# Patient Record
Sex: Male | Born: 1975
Health system: Southern US, Community
[De-identification: ages and names within clinical notes are randomized; demographics above are authoritative.]

## PROBLEM LIST (undated history)

## (undated) DIAGNOSIS — I10 Essential (primary) hypertension: Secondary | ICD-10-CM

## (undated) DIAGNOSIS — J690 Pneumonitis due to inhalation of food and vomit: Secondary | ICD-10-CM

## (undated) DIAGNOSIS — K7200 Acute and subacute hepatic failure without coma: Secondary | ICD-10-CM

## (undated) DIAGNOSIS — K759 Inflammatory liver disease, unspecified: Secondary | ICD-10-CM

## (undated) DIAGNOSIS — M6282 Rhabdomyolysis: Secondary | ICD-10-CM

## (undated) DIAGNOSIS — J9600 Acute respiratory failure, unspecified whether with hypoxia or hypercapnia: Secondary | ICD-10-CM

## (undated) DIAGNOSIS — G8929 Other chronic pain: Secondary | ICD-10-CM

## (undated) DIAGNOSIS — M549 Dorsalgia, unspecified: Secondary | ICD-10-CM

## (undated) DIAGNOSIS — E119 Type 2 diabetes mellitus without complications: Secondary | ICD-10-CM

## (undated) DIAGNOSIS — G9340 Encephalopathy, unspecified: Secondary | ICD-10-CM

## (undated) DIAGNOSIS — F1010 Alcohol abuse, uncomplicated: Secondary | ICD-10-CM

## (undated) DIAGNOSIS — R413 Other amnesia: Secondary | ICD-10-CM

## (undated) DIAGNOSIS — M5430 Sciatica, unspecified side: Secondary | ICD-10-CM

## (undated) HISTORY — DX: Acute and subacute hepatic failure without coma: K72.00

## (undated) HISTORY — DX: Encephalopathy, unspecified: G93.40

## (undated) HISTORY — PX: APPENDECTOMY: SHX54

## (undated) HISTORY — DX: Pneumonitis due to inhalation of food and vomit: J69.0

## (undated) HISTORY — DX: Inflammatory liver disease, unspecified: K75.9

## (undated) HISTORY — DX: Acute respiratory failure, unspecified whether with hypoxia or hypercapnia: J96.00

## (undated) HISTORY — DX: Essential (primary) hypertension: I10

## (undated) HISTORY — DX: Rhabdomyolysis: M62.82

## (undated) HISTORY — DX: Type 2 diabetes mellitus without complications: E11.9

---

## 2000-07-03 HISTORY — PX: BACK SURGERY: SHX140

## 2014-09-01 DIAGNOSIS — G9340 Encephalopathy, unspecified: Secondary | ICD-10-CM

## 2014-09-01 DIAGNOSIS — J690 Pneumonitis due to inhalation of food and vomit: Secondary | ICD-10-CM

## 2014-09-01 DIAGNOSIS — J9600 Acute respiratory failure, unspecified whether with hypoxia or hypercapnia: Secondary | ICD-10-CM

## 2014-09-01 HISTORY — DX: Encephalopathy, unspecified: G93.40

## 2014-09-01 HISTORY — DX: Pneumonitis due to inhalation of food and vomit: J69.0

## 2014-09-01 HISTORY — DX: Acute respiratory failure, unspecified whether with hypoxia or hypercapnia: J96.00

## 2014-10-03 ENCOUNTER — Emergency Department (HOSPITAL_COMMUNITY)
Admission: EM | Admit: 2014-10-03 | Discharge: 2014-10-03 | Disposition: A | Discharge status: Home / Self Care | Payer: BLUE CROSS/BLUE SHIELD | Attending: Emergency Medicine | Admitting: Emergency Medicine

## 2014-10-03 ENCOUNTER — Encounter (HOSPITAL_COMMUNITY): Payer: Self-pay | Admitting: Nurse Practitioner

## 2014-10-03 DIAGNOSIS — Z794 Long term (current) use of insulin: Secondary | ICD-10-CM | POA: Insufficient documentation

## 2014-10-03 DIAGNOSIS — Z9889 Other specified postprocedural states: Secondary | ICD-10-CM | POA: Insufficient documentation

## 2014-10-03 DIAGNOSIS — G8929 Other chronic pain: Secondary | ICD-10-CM | POA: Insufficient documentation

## 2014-10-03 DIAGNOSIS — Z79899 Other long term (current) drug therapy: Secondary | ICD-10-CM | POA: Insufficient documentation

## 2014-10-03 DIAGNOSIS — Z720 Tobacco use: Secondary | ICD-10-CM | POA: Diagnosis not present

## 2014-10-03 DIAGNOSIS — M549 Dorsalgia, unspecified: Secondary | ICD-10-CM | POA: Insufficient documentation

## 2014-10-03 DIAGNOSIS — Z760 Encounter for issue of repeat prescription: Secondary | ICD-10-CM | POA: Diagnosis not present

## 2014-10-03 HISTORY — DX: Sciatica, unspecified side: M54.30

## 2014-10-03 MED ORDER — METHOCARBAMOL 500 MG PO TABS: 500.0000 mg | ORAL_TABLET | Freq: Two times a day (BID) | ORAL | Status: DC

## 2014-10-03 MED ORDER — OXYCODONE-ACETAMINOPHEN 5-325 MG PO TABS
2.0000 | ORAL_TABLET | Freq: Once | ORAL | Status: AC
  Administered 2014-10-03: 2 via ORAL
  Filled 2014-10-03: qty 2

## 2014-10-03 MED ORDER — OXYCODONE-ACETAMINOPHEN 5-325 MG PO TABS: 2.0000 | ORAL_TABLET | ORAL | Status: DC | PRN

## 2014-10-03 NOTE — ED Notes (Addendum)
Pt c/o severe lower back pain radiating down R leg x 1 week. He states it is his sciatica pain. He denies any new injuries and reports the pain has been under control recently. He did have back surgery many years ago. He tried tramadol at home which provided minimal pain relief and he ran out of it. He also tried ice packs. His HR is elevated, he feels that it is due to the pain.

## 2014-10-03 NOTE — ED Provider Notes (Signed)
CSN: [id number]     Arrival date & time 10/03/14  1058 History   First MD Initiated Contact with Patient 10/03/14 1145     Chief Complaint  Patient presents with  . Back Pain     (Consider location/radiation/quality/duration/timing/severity/associated sxs/prior Treatment) Patient is a 39 y.o. male presenting with back pain. The history is provided by the patient and a parent. No language interpreter was used.  Back Pain Associated symptoms: no abdominal pain, no numbness and no weakness   Chris Smith is a 39 y.o white male who has a history of back pain and previous back surgery.  He presents today for chronic back pain that he says is no different than his normal back pain.  [place] recently moved here from [place] and is living with his father.  He was followed by pain management until recently.  He was taking oxycodone for pain but has run out of his pain medications. He was given tramadol by Dr. Farris HasKramer, his orthopedist. He has an MRI of his back scheduled for 4pm today. He denies a history of cancer, injury, or IV drug use.  He denies any fever, chills, hematuria, dysuria, urinary frequency, or CVA tenderness.  Past Medical History  Diagnosis Date  . Sciatica    Past Surgical History  Procedure Laterality Date  . Appendectomy    . Back surgery     History reviewed. No pertinent family history. History  Substance Use Topics  . Smoking status: Current Some Day Smoker  . Smokeless tobacco: Not on file  . Alcohol Use: No    Review of Systems  Gastrointestinal: Negative for abdominal pain, blood in stool and abdominal distention.  Musculoskeletal: Positive for back pain.  Neurological: Negative for weakness and numbness.  All other systems reviewed and are negative.     Allergies  Review of patient's allergies indicates no known allergies.  Home Medications   Prior to Admission medications   Medication Sig Start Date End Date Taking? Authorizing Provider  enalapril  (VASOTEC) 20 MG tablet Take 20 mg by mouth 2 (two) times daily.   Yes Historical Provider, MD  insulin aspart (NOVOLOG) 100 UNIT/ML injection Inject 5-20 Units into the skin 3 (three) times daily before meals. Sliding scale   Yes Historical Provider, MD  insulin detemir (LEVEMIR) 100 UNIT/ML injection Inject 60 Units into the skin every morning.   Yes Historical Provider, MD  metoprolol (TOPROL-XL) 200 MG 24 hr tablet Take 200 mg by mouth daily.   Yes Historical Provider, MD  traMADol (ULTRAM) 50 MG tablet Take 50 mg by mouth every 6 (six) hours as needed for moderate pain.   Yes Historical Provider, MD  methocarbamol (ROBAXIN) 500 MG tablet Take 1 tablet (500 mg total) by mouth 2 (two) times daily. 10/03/14   Felix Pratt Patel-Mills, PA-C  oxyCODONE-acetaminophen (PERCOCET/ROXICET) 5-325 MG per tablet Take 2 tablets by mouth every 4 (four) hours as needed for severe pain. 10/03/14   Kendricks Reap Patel-Mills, PA-C   BP 134/72 mmHg  Pulse 98  Temp(Src) 97.8 F (36.6 C) (Oral)  Resp 20  SpO2 98% Physical Exam  Constitutional: He is oriented to person, place, and time. He appears well-developed and well-nourished.  Cardiovascular: Normal rate, regular rhythm and normal heart sounds.   Pulmonary/Chest: Effort normal and breath sounds normal.  Abdominal: Soft. There is no tenderness.  Musculoskeletal: Normal range of motion.  Sitting with legs crossed on exam.  He has a L-spine surgical scar. He has mild reproducible lower back  tenderness to palpation.  No midline tenderness. Negative straight leg raise.  He has good strength and sensation in his lower extremities.   Neurological: He is alert and oriented to person, place, and time.  Skin: Skin is warm and dry.  Nursing note and vitals reviewed.   ED Course  Procedures (including critical care time) Labs Review Labs Reviewed - No data to display  Imaging Review No results found.   EKG Interpretation None      MDM   Final diagnoses:  Back pain,  chronic  Patient presents for chronic back pain that is like his normal back pain.  He was being seen by pain management in [place] but ran out of his oxycodone. He recently moved here and lives with his father. He has an MRI scheduled this afternoon by his orthopedist, Dr. Farris Has.  He was given tramadol 4 days ago but states it is not working. He does not have any saddle anesthesia, no fever, no midline tenderness, no injury, no history of cancer or IV drug use.   I explained that he would need to be prescribed narcotic pain medication by a pain management specialist.  His orthopedist did not feel that a stronger pain medication was necessary at the time of his appointment. He has a f/u appointment on Monday with ortho to discuss MRI. I have given him 8 percocet and muscle relaxers for pain for the next 3 days until his f/u appointment but explained that we do not manage chronic pain in the ED. He is to take ibuprofen also. He agrees with the plan and will f/u.      Catha Gosselin, PA-C 10/03/14 1603  Blake Divine, MD 10/04/14 [phone]

## 2014-10-03 NOTE — Discharge Instructions (Signed)

## 2014-10-13 ENCOUNTER — Ambulatory Visit: Payer: BLUE CROSS/BLUE SHIELD | Attending: Family Medicine | Admitting: Family Medicine

## 2014-10-13 ENCOUNTER — Inpatient Hospital Stay: Payer: BLUE CROSS/BLUE SHIELD | Admitting: Family Medicine

## 2014-10-13 ENCOUNTER — Encounter: Payer: Self-pay | Admitting: Family Medicine

## 2014-10-13 VITALS — BP 137/100 | HR 141 | Temp 97.7°F | Resp 20 | Ht 74.0 in | Wt 267.0 lb

## 2014-10-13 DIAGNOSIS — E118 Type 2 diabetes mellitus with unspecified complications: Secondary | ICD-10-CM

## 2014-10-13 DIAGNOSIS — Z79899 Other long term (current) drug therapy: Secondary | ICD-10-CM | POA: Insufficient documentation

## 2014-10-13 DIAGNOSIS — E1365 Other specified diabetes mellitus with hyperglycemia: Secondary | ICD-10-CM | POA: Insufficient documentation

## 2014-10-13 DIAGNOSIS — M545 Low back pain, unspecified: Secondary | ICD-10-CM | POA: Insufficient documentation

## 2014-10-13 DIAGNOSIS — E669 Obesity, unspecified: Secondary | ICD-10-CM

## 2014-10-13 DIAGNOSIS — E119 Type 2 diabetes mellitus without complications: Secondary | ICD-10-CM | POA: Insufficient documentation

## 2014-10-13 DIAGNOSIS — R413 Other amnesia: Secondary | ICD-10-CM

## 2014-10-13 DIAGNOSIS — Z794 Long term (current) use of insulin: Secondary | ICD-10-CM | POA: Insufficient documentation

## 2014-10-13 DIAGNOSIS — F17200 Nicotine dependence, unspecified, uncomplicated: Secondary | ICD-10-CM | POA: Insufficient documentation

## 2014-10-13 DIAGNOSIS — M5440 Lumbago with sciatica, unspecified side: Secondary | ICD-10-CM

## 2014-10-13 DIAGNOSIS — I10 Essential (primary) hypertension: Secondary | ICD-10-CM | POA: Diagnosis not present

## 2014-10-13 LAB — COMPREHENSIVE METABOLIC PANEL
ALBUMIN: 4.2 g/dL (ref 3.5–5.2)
ALK PHOS: 92 U/L (ref 39–117)
ALT: 19 U/L (ref 0–53)
AST: 16 U/L (ref 0–37)
BUN: 4 mg/dL — AB (ref 6–23)
CHLORIDE: 99 meq/L (ref 96–112)
CO2: 24 meq/L (ref 19–32)
Calcium: 10.4 mg/dL (ref 8.4–10.5)
Creat: 0.67 mg/dL (ref 0.50–1.35)
Glucose, Bld: 216 mg/dL — ABNORMAL HIGH (ref 70–99)
POTASSIUM: 4.6 meq/L (ref 3.5–5.3)
SODIUM: 138 meq/L (ref 135–145)
Total Bilirubin: 0.6 mg/dL (ref 0.2–1.2)
Total Protein: 7.5 g/dL (ref 6.0–8.3)

## 2014-10-13 LAB — POCT GLYCOSYLATED HEMOGLOBIN (HGB A1C): Hemoglobin A1C: 10.1

## 2014-10-13 LAB — GLUCOSE, POCT (MANUAL RESULT ENTRY): POC GLUCOSE: 242 mg/dL — AB (ref 70–99)

## 2014-10-13 MED ORDER — KETOROLAC TROMETHAMINE 60 MG/2ML IM SOLN
60.0000 mg | Freq: Once | INTRAMUSCULAR | Status: AC
  Administered 2014-10-13: 60 mg via INTRAMUSCULAR

## 2014-10-13 MED ORDER — INSULIN DETEMIR 100 UNIT/ML ~~LOC~~ SOLN: 35.0000 [IU] | Freq: Two times a day (BID) | SUBCUTANEOUS | Status: DC

## 2014-10-13 MED ORDER — MELOXICAM 7.5 MG PO TABS: 7.5000 mg | ORAL_TABLET | Freq: Every day | ORAL | Status: DC

## 2014-10-13 NOTE — Patient Instructions (Signed)
Diabetes and Exercise Exercising regularly is important. It is not just about losing weight. It has many health benefits, such as:  Improving your overall fitness, flexibility, and endurance.  Increasing your bone density.  Helping with weight control.  Decreasing your body fat.  Increasing your muscle strength.  Reducing stress and tension.  Improving your overall health. People with diabetes who exercise gain additional benefits because exercise:  Reduces appetite.  Improves the body's use of blood sugar (glucose).  Helps lower or control blood glucose.  Decreases blood pressure.  Helps control blood lipids (such as cholesterol and triglycerides).  Improves the body's use of the hormone insulin by:  Increasing the body's insulin sensitivity.  Reducing the body's insulin needs.  Decreases the risk for heart disease because exercising:  Lowers cholesterol and triglycerides levels.  Increases the levels of good cholesterol (such as high-density lipoproteins [HDL]) in the body.  Lowers blood glucose levels. YOUR ACTIVITY PLAN  Choose an activity that you enjoy and set realistic goals. Your health care provider or diabetes educator can help you make an activity plan that works for you. Exercise regularly as directed by your health care provider. This includes:  Performing resistance training twice a week such as push-ups, sit-ups, lifting weights, or using resistance bands.  Performing 150 minutes of cardio exercises each week such as walking, running, or playing sports.  Staying active and spending no more than 90 minutes at one time being inactive. Even short bursts of exercise are good for you. Three 10-minute sessions spread throughout the day are just as beneficial as a single 30-minute session. Some exercise ideas include:  Taking the dog for a walk.  Taking the stairs instead of the elevator.  Dancing to your favorite song.  Doing an exercise  video.  Doing your favorite exercise with a friend. RECOMMENDATIONS FOR EXERCISING WITH TYPE 1 OR TYPE 2 DIABETES   Check your blood glucose before exercising. If blood glucose levels are greater than 240 mg/dL, check for urine ketones. Do not exercise if ketones are present.  Avoid injecting insulin into areas of the body that are going to be exercised. For example, avoid injecting insulin into:  The arms when playing tennis.  The legs when jogging.  Keep a record of:  Food intake before and after you exercise.  Expected peak times of insulin action.  Blood glucose levels before and after you exercise.  The type and amount of exercise you have done.  Review your records with your health care provider. Your health care provider will help you to develop guidelines for adjusting food intake and insulin amounts before and after exercising.  If you take insulin or oral hypoglycemic agents, watch for signs and symptoms of hypoglycemia. They include:  Dizziness.  Shaking.  Sweating.  Chills.  Confusion.  Drink plenty of water while you exercise to prevent dehydration or heat stroke. Body water is lost during exercise and must be replaced.  Talk to your health care provider before starting an exercise program to make sure it is safe for you. Remember, almost any type of activity is better than none. Document Released: 09/09/2003 Document Revised: 11/03/2013 Document Reviewed: 11/26/2012 Exit[employer] Patient Information 2015 [employer]. This information is not intended to replace advice given to you by your health care provider. Make sure you discuss any questions you have with your health care provider.  

## 2014-10-13 NOTE — Progress Notes (Signed)
Subjective:    Patient ID: Chris Smith, male    DOB: [birth year], 39 y.o.   MRN: [id number]  HPI  Chris Smith is a new patient with a history of diabetes mellitus, hypertension, chronic low back pain with sciatica previously followed by pain management and now under the care of an orthopedic. He recently had an ED visit for exacerbation of his low back pain where he received a Percocet tablets.  Accompanied by his dad who gives most of the history; patient relocated from [place] after he had a recent hospitalization last month for DKA with resulting loss of short-term memory and he has had problems with administering his medications correctly. Of note he received 60 tramadol pills from his orthopedic which he consumed in 4 days and as per the patient the Tylenol wasn't working. He endorses that he sometimes forgets to take his insulin. His dad is requesting he be placed in a nursing home patient is agreeable to this. The patient complains to me that he left his kids packing [place] gets sad over the fact that he can get to see them since girlfriend took them away.  He complains of persisting low back pain which radiates to both lower extremities and he was receiving oxycodone from a pain management clinic while he was packing [place] and now doesn't have anything that. He denies weakness in the legs no loss of sphincteric function. He does have some numbness in the tips affects his toes and his fingers   Past Medical History  Diagnosis Date  . Sciatica   . Diabetes mellitus without complication   . Hypertension     Past Surgical History  Procedure Laterality Date  . Appendectomy    . Back surgery    . Appendectomy  1994    History   Social History  . Marital Status: Single    Spouse Name: N/A  . Number of Children: N/A  . Years of Education: N/A   Occupational History  . Not on file.   Social History Main Topics  . Smoking status: Current Every Day Smoker --  0.50 packs/day for 18 years  . Smokeless tobacco: Not on file  . Alcohol Use: No  . Drug Use: No  . Sexual Activity: Not on file   Other Topics Concern  . Not on file   Social History Narrative    No Known Allergies  Current Outpatient Prescriptions on File Prior to Visit  Medication Sig Dispense Refill  . enalapril (VASOTEC) 20 MG tablet Take 20 mg by mouth 2 (two) times daily.    . insulin aspart (NOVOLOG) 100 UNIT/ML injection Inject 5-20 Units into the skin 3 (three) times daily before meals. Sliding scale    . methocarbamol (ROBAXIN) 500 MG tablet Take 1 tablet (500 mg total) by mouth 2 (two) times daily. 10 tablet 0  . metoprolol (TOPROL-XL) 200 MG 24 hr tablet Take 200 mg by mouth daily.    . traMADol (ULTRAM) 50 MG tablet Take 50 mg by mouth every 6 (six) hours as needed for moderate pain.    Marland Kitchen oxyCODONE-acetaminophen (PERCOCET/ROXICET) 5-325 MG per tablet Take 2 tablets by mouth every 4 (four) hours as needed for severe pain. (Patient not taking: Reported on 10/13/2014) 8 tablet 0   No current facility-administered medications on file prior to visit.     Review of Systems  General: negative for fever, weight loss, appetite change Eyes: no visual symptoms. ENT: no ear symptoms, no sinus  tenderness, no nasal congestion or sore throat. Neck: no pain  Respiratory: no wheezing, shortness of breath, cough Cardiovascular: no chest pain, no dyspnea on exertion, no pedal edema, no orthopnea. Gastrointestinal: no abdominal pain, no diarrhea, no constipation Genito-Urinary: no urinary frequency, no dysuria, no polyuria. Hematologic: no bruising Endocrine: no cold or heat intolerance Neurological: no headaches, no seizures, no tremors, admits to memory loss  Musculoskeletal: low back pain Skin: no pruritus, no rash. Psychological: no depression, no anxiety,        Objective:  Filed Vitals:   10/13/14 1112  BP: 137/100  Pulse: 141  Temp:   Resp:       Physical Exam    Constitutional: normal appearing,  Eyes: PERRLA HENT: Head is atraumatic, normal sinuses, normal oropharynx, normal appearing tonsils and palate Neck: normal range of motion, no thyromegaly, no JVD cardiovascular: tachycardic rate and rhythm, normal heart sounds, no murmurs, rub or gallop, no pedal edema Respiratory: clear to auscultation bilaterally, no wheezes, no rales, no rhonchi Abdomen: soft, not tender to palpation, normal bowel sounds, no enlarged organs Musculoskeletal: healed lumbar midline surgical scar, positive straight leg raise bilaterally Skin: warm and dry, no lesions. Neurological: alert, oriented x3, cranial nerves I-XII grossly intact Psychological: normal mood.  PH Q9 score is 10 and GAD 7 score is 5         Assessment & Plan:  39 year old male with uncontrolled diabetes mellitus currently on insulin and a history of low back pain status post surgery in the past with ongoing exacerbation associated with sciatica. Patient has shown evidence of poor ability to independently manage her medical conditions and administer medication raising concerns for the need for admission to a nursing facility.  Diabetes mellitus; Uncontrolled with an A1c of 10.1 and CBG of 242 Associated neuropathy; patient was previously on Lyrica prior to coming to [place] but given his current mental state I would hold off until his been seen by neurology. I'm increasing the Levemir to 35 units twice a day and his blood sugars will be reassessed at his next office visit. We would need to obtain his old records; up to date on Pneumovax ; referral to ophthalmology at his next visit.  Hypertension: Uncontrolled with a diastolic elevation. I will not make any changes to his antihypertensive regimen given that this is a his initial visit follow reassess at his next office visit for the need for an increase in dosing.  Amnesia: Short term memory loss due to recent history of DKA. We'll  refer to neurology for further evaluation. I have gotten the social worker to see him to discuss available options for nursing placement; he does have some component of anxiety evidenced by an elevated heart rate which he attributes to being nervous about being in the clinic today  Low back pain with sciatica: Previous history of lumbar surgery, previously followed by pain management and at this time is awaiting a pain management appointment. I have discussed with him and he stated that he is high risk patient given his history of recently overdosing on tramadol secondary to inability to administer medications properly I have advised him to get in touch with his orthopedic regarding refill of his tramadol meanwhile i have placed him on Mobic  Obesity: Advised, cutting portions and increasing physical activity.

## 2014-10-13 NOTE — Progress Notes (Signed)
Patient reports chronic lower back pain. Currently having sciatica down right leg, causes leg to cramp. Currently back pain is at level 8, described as aching, "if I move a certain way it takes my breath", "it's just not good". Patient hospitalized 3/13-3/26 for diabetic coma. Patient is trying to get admitted to rehab or mental health center per patient's father at bedside. Back doctor gave patient 60 tramadol, patient took all 60 in 4 days, patient's father states patient can't remember when he's taken medications, patient reports "they just don't work".

## 2014-10-14 LAB — MICROALBUMIN / CREATININE URINE RATIO
Creatinine, Urine: 138.5 mg/dL
MICROALB/CREAT RATIO: 23.8 mg/g (ref 0.0–30.0)
Microalb, Ur: 3.3 mg/dL — ABNORMAL HIGH (ref <2.0)

## 2014-10-15 ENCOUNTER — Telehealth: Payer: Self-pay

## 2014-10-15 NOTE — Telephone Encounter (Signed)
Left message with patients father, patient stated in office during appointment that it was okay to discuss care with his father. Explained to father that kidneys were spilling protein due to diabetes and for patient to keep taking enalapril which will help. Father voices understanding.

## 2014-10-15 NOTE — Telephone Encounter (Signed)
-----   Message from Enobong Amao, MD sent at 10/14/2014 10:34 AM EDT ----- Please inform him that his urine reveals kidneys are spilling proteins and this is due to his diabetes,; remains on enalapril which should help with this. 

## 2014-10-15 NOTE — Telephone Encounter (Signed)
-----   Message from Jaclyn ShaggyEnobong Amao, MD sent at 10/14/2014 10:34 AM EDT ----- Please inform him that his urine reveals kidneys are spilling proteins and this is due to his diabetes,; remains on enalapril which should help with this.

## 2014-10-20 ENCOUNTER — Telehealth: Payer: Self-pay | Admitting: Clinical

## 2014-10-20 ENCOUNTER — Ambulatory Visit (INDEPENDENT_AMBULATORY_CARE_PROVIDER_SITE_OTHER): Payer: BLUE CROSS/BLUE SHIELD | Admitting: Neurology

## 2014-10-20 ENCOUNTER — Encounter: Payer: Self-pay | Admitting: Neurology

## 2014-10-20 VITALS — BP 140/100 | HR 136 | Resp 18 | Ht 74.0 in | Wt 263.0 lb

## 2014-10-20 DIAGNOSIS — F32A Depression, unspecified: Secondary | ICD-10-CM

## 2014-10-20 DIAGNOSIS — R413 Other amnesia: Secondary | ICD-10-CM

## 2014-10-20 DIAGNOSIS — E0841 Diabetes mellitus due to underlying condition with diabetic mononeuropathy: Secondary | ICD-10-CM

## 2014-10-20 DIAGNOSIS — F329 Major depressive disorder, single episode, unspecified: Secondary | ICD-10-CM

## 2014-10-20 LAB — VITAMIN B12: Vitamin B-12: 510 pg/mL (ref 211–911)

## 2014-10-20 LAB — TSH: TSH: 2.469 u[IU]/mL (ref 0.350–4.500)

## 2014-10-20 MED ORDER — PAROXETINE HCL 10 MG PO TABS: 10.0000 mg | ORAL_TABLET | Freq: Every day | ORAL | Status: DC

## 2014-10-20 MED ORDER — VITAMIN B-1 100 MG PO TABS: 100.0000 mg | ORAL_TABLET | Freq: Every day | ORAL | Status: DC

## 2014-10-20 MED ORDER — FOLIC ACID 1 MG PO TABS: 1.0000 mg | ORAL_TABLET | Freq: Every day | ORAL | Status: DC

## 2014-10-20 NOTE — Patient Instructions (Addendum)
1. Schedule MRI brain without contrast 2. Bloodwork for TSH, B12 3. Start daily thiamine 100mg  4. Start daily folic acid 1mg  5. Start Paxil 10mg  daily  6. Follow-up in 3 months, call our office for any changes

## 2014-10-20 NOTE — Telephone Encounter (Signed)
F/U call to relay message from Miquel DunnLatasha Hunter, Interior and spatial designerDirector of Admissions at [facility]: center will accept clients with [employer], [employer] out-of-state. Pt needs to contact [facility] [phone] or visit at [address], [place], [place]. Pt and father state they will go today.

## 2014-10-20 NOTE — Progress Notes (Signed)
NEUROLOGY CONSULTATION NOTE  Chris Smith MRN: [id number] DOB: 27-Oct-1975  Referring provider: Dr. Jaclyn Shaggy Primary care provider: Beverley Fiedler  Reason for consult:  amnesia  Dear Dr Venetia Night:  Thank you for your kind referral of Chris Smith for consultation of the above symptoms. Although Chris history is well known to you, please allow me to reiterate it for the purpose of our medical record. The patient was accompanied to the clinic by Chris Chris Smith who also provides collateral information. Records and images were personally reviewed where available.  HISTORY OF PRESENT ILLNESS: This is a 39 year old right-handed man with a history of hypertension, diabetes, who was living in [place] where he was found unresponsive and was intubated for 4 days, with a diagnosis of diabetic ketoacidosis. He was admitted from 3/13/ to 3/26, then Chris Chris Smith picked him up 2 weeks ago and moved him to [place]. He has been living in a motel since then and has had memory difficulties, particularly with Chris medications. He reports that he has had no short term memory since hospital discharge. Chris long-term memory is pretty good. He asks the same questions repeatedly. He could not find their car in a parking lot. Chris Chris Smith reports he has to be encouraged to take a shower. He denies any word-finding difficulties, no other difficulties with ADLs. He denies memory changes prior to the hospitalization. Chris Chris Smith reports that when he arrived, Chris Smith would not smile and would not respond to anything. Now he has a lot more of Chris feelings back and is more irritable. He has been more aggressive in the past 2 days, and states he is just upset over the whole situation. He misses Chris Smith in [place].   He denies any headaches, dizziness, vision changes. He has had numbness and tingling in Chris fingers and toes since the coma (no prior history of neuropathy). He denies any bowel/bladder changes. He has no  appetite and lost 70 lbs in a month. He usually gest 5 to 5-1/2 hours of sleep and uses a CPAP machine. He has chronic back pain. He has not driven since he left the hospital. Chris Chris Smith reports Chris memory is better off alcohol and glucose control. He endorsed drinking 3-4 vodka drinks daily for many years.   Laboratory data:    Chemistry      Component Value Date/Time   NA 138 10/13/2014 1212   K 4.6 10/13/2014 1212   CL 99 10/13/2014 1212   CO2 24 10/13/2014 1212   BUN 4* 10/13/2014 1212   CREATININE 0.67 10/13/2014 1212      Component Value Date/Time   CALCIUM 10.4 10/13/2014 1212   ALKPHOS 92 10/13/2014 1212   AST 16 10/13/2014 1212   ALT 19 10/13/2014 1212   BILITOT 0.6 10/13/2014 1212     Lab Results  Component Value Date   HGBA1C 10.10 10/13/2014    PAST MEDICAL HISTORY: Past Medical History  Diagnosis Date  . Sciatica   . Diabetes mellitus without complication   . Hypertension   . Acute respiratory failure 09/2014  . Encephalopathy 09/2014  . Aspiration pneumonia 09/2014  . Rhabdomyolysis unknown  . Ischemic hepatitis unknown    PAST SURGICAL HISTORY: Past Surgical History  Procedure Laterality Date  . Appendectomy    . Back surgery  2002    Lumbar    MEDICATIONS: Current Outpatient Prescriptions on File Prior to Visit  Medication Sig Dispense Refill  . enalapril (VASOTEC) 20 MG  tablet Take 20 mg by mouth 2 (two) times daily.    . insulin aspart (NOVOLOG) 100 UNIT/ML injection Inject 5-20 Units into the skin 3 (three) times daily before meals. Sliding scale    . insulin detemir (LEVEMIR) 100 UNIT/ML injection Inject 0.35 mLs (35 Units total) into the skin 2 (two) times daily. 3 vial 1  . meloxicam (MOBIC) 7.5 MG tablet Take 1 tablet (7.5 mg total) by mouth daily. 30 tablet 0  . methocarbamol (ROBAXIN) 500 MG tablet Take 1 tablet (500 mg total) by mouth 2 (two) times daily. 10 tablet 0  . metoprolol (TOPROL-XL) 200 MG 24 hr tablet Take 200 mg by mouth  daily.     No current facility-administered medications on file prior to visit.    ALLERGIES: No Known Allergies  FAMILY HISTORY: Family History  Problem Relation Age of Onset  . Cancer Mother     cause of death, ovarian & colon  . Diabetes Chris Smith   . Hypertension Chris Smith   . Gout Chris Smith   . Hyperlipidemia Chris Smith     SOCIAL HISTORY: History   Social History  . Marital Status: Single    Spouse Name: N/A  . Number of Children: 2  . Years of Education: N/A   Occupational History  . Disability    Social History Main Topics  . Smoking status: Current Every Day Smoker -- 0.50 packs/day for 18 years  . Smokeless tobacco: Not on file  . Alcohol Use: No  . Drug Use: No  . Sexual Activity: Not on file   Other Topics Concern  . Not on file   Social History Narrative    REVIEW OF SYSTEMS: Constitutional: No fevers, chills, or sweats, no generalized fatigue, change in appetite Eyes: No visual changes, double vision, eye pain Ear, nose and throat: No hearing loss, ear pain, nasal congestion, sore throat Cardiovascular: No chest pain, palpitations Respiratory:  No shortness of breath at rest or with exertion, wheezes GastrointestinaI: No nausea, vomiting, diarrhea, abdominal pain, fecal incontinence Genitourinary:  No dysuria, urinary retention or frequency Musculoskeletal:  + neck pain, back pain Integumentary: No rash, pruritus, skin lesions Neurological: as above Psychiatric: No depression, insomnia, anxiety Endocrine: No palpitations, fatigue, diaphoresis, mood swings, change in appetite, change in weight, increased thirst Hematologic/Lymphatic:  No anemia, purpura, petechiae. Allergic/Immunologic: no itchy/runny eyes, nasal congestion, recent allergic reactions, rashes  PHYSICAL EXAM: Filed Vitals:   10/20/14 1320  BP: 140/100  Pulse: 136  Resp: 18   General: No acute distress Head:  Normocephalic/atraumatic Eyes: Fundoscopic exam shows bilateral sharp discs,  no vessel changes, exudates, or hemorrhages Neck: supple, no paraspinal tenderness, full range of motion Back: No paraspinal tenderness Heart: regular rate and rhythm Lungs: Clear to auscultation bilaterally. Vascular: No carotid bruits. Skin/Extremities: No rash, no edema Neurological Exam: Mental status: alert and oriented to person, place, and time, no dysarthria or aphasia, Fund of knowledge is appropriate.  Remote memory intact.  Attention and concentration are normal.    Able to name objects and repeat phrases.  MMSE - Mini Mental State Exam 10/20/2014  Orientation to time 4  Orientation to Place 5  Registration 3  Attention/ Calculation 5  Recall 1  Language- name 2 objects 2  Language- repeat 1  Language- follow 3 step command 3  Language- read & follow direction 1  Write a sentence 1  Copy design 1  Total score 27   Cranial nerves: CN I: not tested CN II: pupils equal, round and  reactive to light, visual fields intact, fundi unremarkable. CN III, IV, VI:  full range of motion, no nystagmus, no ptosis CN V: facial sensation intact CN VII: upper and lower face symmetric CN VIII: hearing intact to finger rub CN IX, X: gag intact, uvula midline CN XI: sternocleidomastoid and trapezius muscles intact CN XII: tongue midline Bulk & Tone: normal, no fasciculations. Motor: 5/5 throughout with no pronator drift. Sensation: decreased cold, pin, and vibration sense up to ankles bilaterally, intact to light touch, cold, on both UE. Decreased pin on right palm. Romberg test negative Deep Tendon Reflexes: +2 on both UE, brisk +3 right patella, +2 left patella, absent ankle jerks bilaterally, no ankle clonus Plantar responses: downgoing bilaterally Cerebellar: no incoordination on finger to nose, heel to shin. No dysdiadochokinesia Gait: narrow-based and steady, able to tandem walk adequately. Tremor: none  IMPRESSION: This is a 39 year old right-handed woman with a history of  hypertension, diabetes, recent hospitalization for diabetic ketoacidosis after he was found unresponsive in [place]. He also has chronic alcohol use. He presents with cognitive changes after coma, considerations include hypoxic encephalopathy, Wernicke encephalopathy. MRI brain without contrast will be ordered to assess for underlying structural abnormality. Check TSH, B12. He will start daily thiamine and folic acid. We also discussed effects of mood on memory, he is noted to be depressed. He may benefit from starting an SSRI, side effects of Paxil were discussed. He will follow-up in 3 months and knows to call our office for any changes.   Thank you for allowing me to participate in the care of this patient. Please do not hesitate to call for any questions or concerns.   Patrcia DollyKaren Jahaziel Francois, M.D.  CC: Dr. Venetia NightAmao

## 2014-10-21 ENCOUNTER — Telehealth: Payer: Self-pay | Admitting: Family Medicine

## 2014-10-21 NOTE — Telephone Encounter (Signed)
Deniece PortelaWayne patient's father returned my call. I notified him of results.

## 2014-10-21 NOTE — Telephone Encounter (Signed)
Lmovm to return my call. 

## 2014-10-21 NOTE — Telephone Encounter (Signed)
-----   Message from Van ClinesKaren M Aquino, MD sent at 10/21/2014  7:59 AM EDT ----- Pls let him know B12 and thyroid levels normal. Thanks

## 2014-10-25 ENCOUNTER — Encounter: Payer: Self-pay | Admitting: Neurology

## 2014-10-25 DIAGNOSIS — E0841 Diabetes mellitus due to underlying condition with diabetic mononeuropathy: Secondary | ICD-10-CM | POA: Insufficient documentation

## 2014-10-25 DIAGNOSIS — F329 Major depressive disorder, single episode, unspecified: Secondary | ICD-10-CM | POA: Insufficient documentation

## 2014-10-25 DIAGNOSIS — R413 Other amnesia: Secondary | ICD-10-CM | POA: Insufficient documentation

## 2014-10-25 DIAGNOSIS — F32A Depression, unspecified: Secondary | ICD-10-CM | POA: Insufficient documentation

## 2014-10-26 ENCOUNTER — Telehealth: Payer: Self-pay

## 2014-10-26 NOTE — Telephone Encounter (Signed)
-----   Message from Jaclyn ShaggyEnobong Amao, MD sent at 10/23/2014  2:06 PM EDT ----- Herbert SetaHeather please forward [facility]. He will need a PPD placement which can be done at his next office visit. Thanks.  ----- Message -----    From: Rae LipsJamie C McMannes, LCSWA    Sent: 10/22/2014   2:35 PM      To: Viviano Simasarmen H Ramirez, Enobong Amao, MD  This was the [facility]. I did not get this voicemail, perhaps it went elsewhere? His medical records need to be sent to [facility], fax is [phone].   "Good morning, I would love to come and walk you through the process for direct admission [facility]. Let me see if my Business office manager is available to assist me in case the team has questions about insurance.  Also, I left you a message yesterday about Mr. Vedia Pereyraerdue. We may be able to assist him. They have come and toured the facility and the father came back yesterday. If you could please send me any information on him so I can get a pasarr, this would be needed for his admission to the facility. Also if the clinic has completed a TB test or chest xray, I will need this information also. Thank you again "

## 2014-10-26 NOTE — Telephone Encounter (Signed)
[facility] packet faxed to Miquel DunnLatasha Hunter, Interior and spatial designerDirector of Admissions, at [facility].

## 2014-10-28 ENCOUNTER — Ambulatory Visit (HOSPITAL_COMMUNITY): Payer: BLUE CROSS/BLUE SHIELD

## 2014-11-02 ENCOUNTER — Telehealth: Payer: Self-pay | Admitting: Clinical

## 2014-11-02 NOTE — Telephone Encounter (Signed)
No message left, attempt to F/U w pt

## 2015-01-19 ENCOUNTER — Ambulatory Visit: Payer: BLUE CROSS/BLUE SHIELD | Admitting: Neurology

## 2016-04-28 ENCOUNTER — Inpatient Hospital Stay (HOSPITAL_COMMUNITY)
Admission: EM | Admit: 2016-04-28 | Discharge: 2016-04-30 | DRG: 894 | Discharge status: Against Medical Advice | Payer: Self-pay | Attending: Internal Medicine | Admitting: Internal Medicine

## 2016-04-28 ENCOUNTER — Observation Stay (HOSPITAL_COMMUNITY): Payer: Self-pay

## 2016-04-28 ENCOUNTER — Emergency Department (HOSPITAL_COMMUNITY): Payer: Self-pay

## 2016-04-28 ENCOUNTER — Encounter (HOSPITAL_COMMUNITY): Payer: Self-pay | Admitting: Emergency Medicine

## 2016-04-28 DIAGNOSIS — M549 Dorsalgia, unspecified: Secondary | ICD-10-CM

## 2016-04-28 DIAGNOSIS — R1012 Left upper quadrant pain: Secondary | ICD-10-CM

## 2016-04-28 DIAGNOSIS — R7401 Elevation of levels of liver transaminase levels: Secondary | ICD-10-CM

## 2016-04-28 DIAGNOSIS — F10239 Alcohol dependence with withdrawal, unspecified: Principal | ICD-10-CM | POA: Diagnosis present

## 2016-04-28 DIAGNOSIS — I10 Essential (primary) hypertension: Secondary | ICD-10-CM | POA: Diagnosis present

## 2016-04-28 DIAGNOSIS — Z91120 Patient's intentional underdosing of medication regimen due to financial hardship: Secondary | ICD-10-CM

## 2016-04-28 DIAGNOSIS — I4581 Long QT syndrome: Secondary | ICD-10-CM | POA: Diagnosis present

## 2016-04-28 DIAGNOSIS — E1165 Type 2 diabetes mellitus with hyperglycemia: Secondary | ICD-10-CM | POA: Diagnosis present

## 2016-04-28 DIAGNOSIS — Z794 Long term (current) use of insulin: Secondary | ICD-10-CM

## 2016-04-28 DIAGNOSIS — E860 Dehydration: Secondary | ICD-10-CM | POA: Diagnosis present

## 2016-04-28 DIAGNOSIS — Z5321 Procedure and treatment not carried out due to patient leaving prior to being seen by health care provider: Secondary | ICD-10-CM | POA: Diagnosis not present

## 2016-04-28 DIAGNOSIS — K700 Alcoholic fatty liver: Secondary | ICD-10-CM | POA: Diagnosis present

## 2016-04-28 DIAGNOSIS — T383X6A Underdosing of insulin and oral hypoglycemic [antidiabetic] drugs, initial encounter: Secondary | ICD-10-CM | POA: Diagnosis present

## 2016-04-28 DIAGNOSIS — R9431 Abnormal electrocardiogram [ECG] [EKG]: Secondary | ICD-10-CM | POA: Diagnosis present

## 2016-04-28 DIAGNOSIS — Z8249 Family history of ischemic heart disease and other diseases of the circulatory system: Secondary | ICD-10-CM

## 2016-04-28 DIAGNOSIS — E669 Obesity, unspecified: Secondary | ICD-10-CM | POA: Diagnosis present

## 2016-04-28 DIAGNOSIS — Z833 Family history of diabetes mellitus: Secondary | ICD-10-CM

## 2016-04-28 DIAGNOSIS — R1011 Right upper quadrant pain: Secondary | ICD-10-CM | POA: Insufficient documentation

## 2016-04-28 DIAGNOSIS — R109 Unspecified abdominal pain: Secondary | ICD-10-CM | POA: Diagnosis present

## 2016-04-28 DIAGNOSIS — Z800 Family history of malignant neoplasm of digestive organs: Secondary | ICD-10-CM

## 2016-04-28 DIAGNOSIS — K760 Fatty (change of) liver, not elsewhere classified: Secondary | ICD-10-CM | POA: Diagnosis present

## 2016-04-28 DIAGNOSIS — E876 Hypokalemia: Secondary | ICD-10-CM | POA: Diagnosis present

## 2016-04-28 DIAGNOSIS — E119 Type 2 diabetes mellitus without complications: Secondary | ICD-10-CM

## 2016-04-28 DIAGNOSIS — Z720 Tobacco use: Secondary | ICD-10-CM | POA: Diagnosis present

## 2016-04-28 DIAGNOSIS — Z8041 Family history of malignant neoplasm of ovary: Secondary | ICD-10-CM

## 2016-04-28 DIAGNOSIS — R740 Nonspecific elevation of levels of transaminase and lactic acid dehydrogenase [LDH]: Secondary | ICD-10-CM

## 2016-04-28 DIAGNOSIS — R000 Tachycardia, unspecified: Secondary | ICD-10-CM | POA: Diagnosis present

## 2016-04-28 DIAGNOSIS — G8929 Other chronic pain: Secondary | ICD-10-CM | POA: Diagnosis present

## 2016-04-28 DIAGNOSIS — E872 Acidosis, unspecified: Secondary | ICD-10-CM | POA: Diagnosis present

## 2016-04-28 DIAGNOSIS — Z87891 Personal history of nicotine dependence: Secondary | ICD-10-CM

## 2016-04-28 DIAGNOSIS — R413 Other amnesia: Secondary | ICD-10-CM | POA: Diagnosis present

## 2016-04-28 DIAGNOSIS — Z9119 Patient's noncompliance with other medical treatment and regimen: Secondary | ICD-10-CM

## 2016-04-28 DIAGNOSIS — F1010 Alcohol abuse, uncomplicated: Secondary | ICD-10-CM | POA: Diagnosis present

## 2016-04-28 DIAGNOSIS — K529 Noninfective gastroenteritis and colitis, unspecified: Secondary | ICD-10-CM | POA: Diagnosis present

## 2016-04-28 HISTORY — DX: Other amnesia: R41.3

## 2016-04-28 HISTORY — DX: Other chronic pain: G89.29

## 2016-04-28 HISTORY — DX: Dorsalgia, unspecified: M54.9

## 2016-04-28 HISTORY — DX: Alcohol abuse, uncomplicated: F10.10

## 2016-04-28 LAB — COMPREHENSIVE METABOLIC PANEL
ALT: 90 U/L — AB (ref 17–63)
AST: 87 U/L — ABNORMAL HIGH (ref 15–41)
Albumin: 4.3 g/dL (ref 3.5–5.0)
Alkaline Phosphatase: 80 U/L (ref 38–126)
Anion gap: 25 — ABNORMAL HIGH (ref 5–15)
BILIRUBIN TOTAL: 1.3 mg/dL — AB (ref 0.3–1.2)
BUN: 6 mg/dL (ref 6–20)
CALCIUM: 9.6 mg/dL (ref 8.9–10.3)
CO2: 19 mmol/L — ABNORMAL LOW (ref 22–32)
CREATININE: 0.79 mg/dL (ref 0.61–1.24)
Chloride: 94 mmol/L — ABNORMAL LOW (ref 101–111)
GFR calc Af Amer: >60 mL/min (ref >60)
Glucose, Bld: 239 mg/dL — ABNORMAL HIGH (ref 65–99)
Potassium: 3.4 mmol/L — ABNORMAL LOW (ref 3.5–5.1)
Sodium: 138 mmol/L (ref 135–145)
TOTAL PROTEIN: 7.7 g/dL (ref 6.5–8.1)

## 2016-04-28 LAB — BASIC METABOLIC PANEL
Anion gap: 11 (ref 5–15)
BUN: 6 mg/dL (ref 6–20)
CHLORIDE: 99 mmol/L — AB (ref 101–111)
CO2: 25 mmol/L (ref 22–32)
Calcium: 8.5 mg/dL — ABNORMAL LOW (ref 8.9–10.3)
Creatinine, Ser: 0.83 mg/dL (ref 0.61–1.24)
GFR calc Af Amer: >60 mL/min (ref >60)
GLUCOSE: 210 mg/dL — AB (ref 65–99)
POTASSIUM: 3.2 mmol/L — AB (ref 3.5–5.1)
Sodium: 135 mmol/L (ref 135–145)

## 2016-04-28 LAB — CBC WITH DIFFERENTIAL/PLATELET
BASOS PCT: 0 %
Basophils Absolute: 0 10*3/uL (ref 0.0–0.1)
EOS PCT: 0 %
Eosinophils Absolute: 0 10*3/uL (ref 0.0–0.7)
HCT: 50.4 % (ref 39.0–52.0)
Hemoglobin: 17.3 g/dL — ABNORMAL HIGH (ref 13.0–17.0)
LYMPHS PCT: 11 %
Lymphs Abs: 1.2 10*3/uL (ref 0.7–4.0)
MCH: 30.1 pg (ref 26.0–34.0)
MCHC: 34.3 g/dL (ref 30.0–36.0)
MCV: 87.8 fL (ref 78.0–100.0)
MONO ABS: 0.8 10*3/uL (ref 0.1–1.0)
Monocytes Relative: 8 %
Neutro Abs: 8.8 10*3/uL — ABNORMAL HIGH (ref 1.7–7.7)
Neutrophils Relative %: 81 %
PLATELETS: 201 10*3/uL (ref 150–400)
RBC: 5.74 MIL/uL (ref 4.22–5.81)
RDW: 13.9 % (ref 11.5–15.5)
WBC: 10.8 10*3/uL — ABNORMAL HIGH (ref 4.0–10.5)

## 2016-04-28 LAB — GLUCOSE, CAPILLARY
GLUCOSE-CAPILLARY: 287 mg/dL — AB (ref 65–99)
Glucose-Capillary: 194 mg/dL — ABNORMAL HIGH (ref 65–99)
Glucose-Capillary: 189 mg/dL — ABNORMAL HIGH (ref 65–99)
Glucose-Capillary: 255 mg/dL — ABNORMAL HIGH (ref 65–99)

## 2016-04-28 LAB — MAGNESIUM: Magnesium: 1.5 mg/dL — ABNORMAL LOW (ref 1.7–2.4)

## 2016-04-28 LAB — LIPASE, BLOOD: Lipase: 27 U/L (ref 11–51)

## 2016-04-28 LAB — LACTIC ACID, PLASMA
LACTIC ACID, VENOUS: 1.9 mmol/L (ref 0.5–1.9)
Lactic Acid, Venous: 1.8 mmol/L (ref 0.5–1.9)

## 2016-04-28 LAB — PHOSPHORUS: PHOSPHORUS: 2.5 mg/dL (ref 2.5–4.6)

## 2016-04-28 LAB — TSH: TSH: 3.667 u[IU]/mL (ref 0.350–4.500)

## 2016-04-28 LAB — VITAMIN B12: Vitamin B-12: 638 pg/mL (ref 180–914)

## 2016-04-28 LAB — TROPONIN I: Troponin I: <0.03 ng/mL (ref <0.03)

## 2016-04-28 MED ORDER — DIPHENHYDRAMINE HCL 50 MG/ML IJ SOLN
25.0000 mg | Freq: Once | INTRAMUSCULAR | Status: AC
  Administered 2016-04-28: 25 mg via INTRAVENOUS
  Filled 2016-04-28: qty 1

## 2016-04-28 MED ORDER — IOPAMIDOL (ISOVUE-300) INJECTION 61%
INTRAVENOUS | Status: AC
  Filled 2016-04-28: qty 100

## 2016-04-28 MED ORDER — MORPHINE SULFATE (PF) 4 MG/ML IV SOLN
2.0000 mg | INTRAVENOUS | Status: DC | PRN
  Administered 2016-04-28 – 2016-04-30 (×10): 2 mg via INTRAVENOUS
  Filled 2016-04-28 (×11): qty 1

## 2016-04-28 MED ORDER — ACETAMINOPHEN 650 MG RE SUPP: 650.0000 mg | Freq: Four times a day (QID) | RECTAL | Status: DC | PRN

## 2016-04-28 MED ORDER — ONDANSETRON HCL 4 MG/2ML IJ SOLN
4.0000 mg | Freq: Four times a day (QID) | INTRAMUSCULAR | Status: DC | PRN
  Administered 2016-04-30: 4 mg via INTRAVENOUS
  Filled 2016-04-28: qty 2

## 2016-04-28 MED ORDER — VITAMIN B-1 100 MG PO TABS
100.0000 mg | ORAL_TABLET | Freq: Every day | ORAL | Status: DC
Start: 2016-04-28 — End: 2016-04-30
  Administered 2016-04-28 – 2016-04-29 (×2): 100 mg via ORAL
  Filled 2016-04-28 (×2): qty 1

## 2016-04-28 MED ORDER — LORAZEPAM 2 MG/ML IJ SOLN
1.0000 mg | Freq: Four times a day (QID) | INTRAMUSCULAR | Status: DC | PRN
  Administered 2016-04-28 – 2016-04-29 (×4): 1 mg via INTRAVENOUS
  Filled 2016-04-28 (×4): qty 1

## 2016-04-28 MED ORDER — CLONIDINE HCL 0.1 MG/24HR TD PTWK
0.1000 mg | MEDICATED_PATCH | TRANSDERMAL | Status: DC
  Administered 2016-04-28: 0.1 mg via TRANSDERMAL
  Filled 2016-04-28: qty 1

## 2016-04-28 MED ORDER — THIAMINE HCL 100 MG/ML IJ SOLN: 100.0000 mg | Freq: Every day | INTRAMUSCULAR | Status: DC

## 2016-04-28 MED ORDER — MORPHINE SULFATE (PF) 4 MG/ML IV SOLN
4.0000 mg | Freq: Once | INTRAVENOUS | Status: AC
  Administered 2016-04-28: 4 mg via INTRAVENOUS
  Filled 2016-04-28: qty 1

## 2016-04-28 MED ORDER — PROCHLORPERAZINE EDISYLATE 5 MG/ML IJ SOLN: 10.0000 mg | Freq: Four times a day (QID) | INTRAMUSCULAR | Status: DC | PRN

## 2016-04-28 MED ORDER — ACETAMINOPHEN 325 MG PO TABS: 650.0000 mg | ORAL_TABLET | Freq: Four times a day (QID) | ORAL | Status: DC | PRN

## 2016-04-28 MED ORDER — MAGNESIUM SULFATE 2 GM/50ML IV SOLN
2.0000 g | Freq: Once | INTRAVENOUS | Status: AC
  Administered 2016-04-28: 2 g via INTRAVENOUS
  Filled 2016-04-28: qty 50

## 2016-04-28 MED ORDER — SODIUM CHLORIDE 0.9 % IV BOLUS (SEPSIS)
1000.0000 mL | Freq: Once | INTRAVENOUS | Status: AC
  Administered 2016-04-28: 1000 mL via INTRAVENOUS

## 2016-04-28 MED ORDER — LORAZEPAM 1 MG PO TABS: 1.0000 mg | ORAL_TABLET | Freq: Four times a day (QID) | ORAL | Status: DC | PRN

## 2016-04-28 MED ORDER — ENOXAPARIN SODIUM 40 MG/0.4ML ~~LOC~~ SOLN
40.0000 mg | Freq: Every day | SUBCUTANEOUS | Status: DC
  Administered 2016-04-28 – 2016-04-29 (×2): 40 mg via SUBCUTANEOUS
  Filled 2016-04-28 (×2): qty 0.4

## 2016-04-28 MED ORDER — INSULIN ASPART 100 UNIT/ML ~~LOC~~ SOLN
0.0000 [IU] | Freq: Three times a day (TID) | SUBCUTANEOUS | Status: DC
  Administered 2016-04-28: 8 [IU] via SUBCUTANEOUS
  Administered 2016-04-28 – 2016-04-29 (×2): 3 [IU] via SUBCUTANEOUS
  Administered 2016-04-29: 5 [IU] via SUBCUTANEOUS

## 2016-04-28 MED ORDER — SODIUM CHLORIDE 0.9 % IV SOLN
INTRAVENOUS | Status: AC
  Administered 2016-04-28: 09:00:00 via INTRAVENOUS

## 2016-04-28 MED ORDER — METRONIDAZOLE IN NACL 5-0.79 MG/ML-% IV SOLN
500.0000 mg | Freq: Three times a day (TID) | INTRAVENOUS | Status: DC
  Administered 2016-04-28 – 2016-04-29 (×4): 500 mg via INTRAVENOUS
  Filled 2016-04-28 (×5): qty 100

## 2016-04-28 MED ORDER — HYDROMORPHONE HCL 2 MG/ML IJ SOLN
1.0000 mg | Freq: Once | INTRAMUSCULAR | Status: AC
  Administered 2016-04-28: 1 mg via INTRAVENOUS
  Filled 2016-04-28: qty 1

## 2016-04-28 MED ORDER — CIPROFLOXACIN IN D5W 400 MG/200ML IV SOLN
400.0000 mg | Freq: Two times a day (BID) | INTRAVENOUS | Status: DC
  Administered 2016-04-28 – 2016-04-29 (×2): 400 mg via INTRAVENOUS
  Filled 2016-04-28 (×2): qty 200

## 2016-04-28 MED ORDER — METOCLOPRAMIDE HCL 5 MG/ML IJ SOLN
10.0000 mg | Freq: Once | INTRAMUSCULAR | Status: AC
  Administered 2016-04-28: 10 mg via INTRAVENOUS
  Filled 2016-04-28: qty 2

## 2016-04-28 MED ORDER — INSULIN DETEMIR 100 UNIT/ML ~~LOC~~ SOLN
30.0000 [IU] | Freq: Every day | SUBCUTANEOUS | Status: DC
  Administered 2016-04-28 – 2016-04-29 (×2): 30 [IU] via SUBCUTANEOUS
  Filled 2016-04-28 (×3): qty 0.3

## 2016-04-28 MED ORDER — CIPROFLOXACIN IN D5W 400 MG/200ML IV SOLN: 400.0000 mg | INTRAVENOUS | Status: DC

## 2016-04-28 MED ORDER — HYDRALAZINE HCL 20 MG/ML IJ SOLN
5.0000 mg | INTRAMUSCULAR | Status: DC | PRN
  Administered 2016-04-29 – 2016-04-30 (×3): 5 mg via INTRAVENOUS
  Filled 2016-04-28 (×2): qty 1

## 2016-04-28 MED ORDER — METRONIDAZOLE IN NACL 5-0.79 MG/ML-% IV SOLN
500.0000 mg | Freq: Once | INTRAVENOUS | Status: AC
  Administered 2016-04-28: 500 mg via INTRAVENOUS
  Filled 2016-04-28: qty 100

## 2016-04-28 MED ORDER — LORAZEPAM 2 MG/ML IJ SOLN
1.0000 mg | Freq: Three times a day (TID) | INTRAMUSCULAR | Status: DC
  Administered 2016-04-28 – 2016-04-29 (×4): 1 mg via INTRAVENOUS
  Filled 2016-04-28 (×4): qty 1

## 2016-04-28 MED ORDER — HYDROMORPHONE HCL 2 MG/ML IJ SOLN
1.0000 mg | INTRAMUSCULAR | Status: DC | PRN
Start: 2016-04-28 — End: 2016-04-28

## 2016-04-28 MED ORDER — CIPROFLOXACIN IN D5W 400 MG/200ML IV SOLN
400.0000 mg | Freq: Once | INTRAVENOUS | Status: AC
  Administered 2016-04-28: 400 mg via INTRAVENOUS
  Filled 2016-04-28: qty 200

## 2016-04-28 MED ORDER — POTASSIUM CHLORIDE 10 MEQ/100ML IV SOLN
10.0000 meq | Freq: Once | INTRAVENOUS | Status: AC
  Administered 2016-04-28: 10 meq via INTRAVENOUS
  Filled 2016-04-28: qty 100

## 2016-04-28 MED ORDER — ONDANSETRON HCL 4 MG PO TABS: 4.0000 mg | ORAL_TABLET | Freq: Four times a day (QID) | ORAL | Status: DC | PRN

## 2016-04-28 MED ORDER — INSULIN ASPART 100 UNIT/ML ~~LOC~~ SOLN
0.0000 [IU] | Freq: Every day | SUBCUTANEOUS | Status: DC
  Administered 2016-04-28: 3 [IU] via SUBCUTANEOUS
  Administered 2016-04-29: 2 [IU] via SUBCUTANEOUS

## 2016-04-28 MED ORDER — FOLIC ACID 1 MG PO TABS
1.0000 mg | ORAL_TABLET | Freq: Every day | ORAL | Status: DC
  Administered 2016-04-28 – 2016-04-29 (×2): 1 mg via ORAL
  Filled 2016-04-28 (×2): qty 1

## 2016-04-28 MED ORDER — ADULT MULTIVITAMIN W/MINERALS CH
1.0000 | ORAL_TABLET | Freq: Every day | ORAL | Status: DC
  Administered 2016-04-28 – 2016-04-29 (×2): 1 via ORAL
  Filled 2016-04-28 (×2): qty 1

## 2016-04-28 NOTE — H&P (Signed)
History and Physical    Chris Smith ZOX:[id number] DOB: 05-25-1976 DOA: 04/28/2016  PCP: No PCP (went to [facility] urgent care just prior to presentation) Patient coming from: home  Chief Complaint: Persistent abdominal pain/nausea/vomiting  HPI: Chris Smith is a 40 y.o. male with medical history significant for diabetes, EtOH abuse, tobacco abuse hypertension presents to the emergency department with chief complaint persistent abdominal pain/nausea/vomiting. Initial evaluation includes CT of the abdomen and pelvis revealing fatty liver, possible enteritis and gallbladder with sludge without obstruction.  Information is obtained from the patient. He states he was in his usual state of health until yesterday when he developed gradual worsening persistent abdominal pain. He describes the pain as constant sharp and located in epigastric area radiating to the left upper quadrant. Associated symptoms include nausea with multiple episodes of nonbloody emesis. He describes the emesis as "bile-like". He states he took some Zofran with little relief. He reports sitting up straight next the pain worse He also complains of a "sore throat" he attributes to all the vomiting and dry heaving. He also complains of some cold chills after vomiting. He denies headache dizziness syncope or near-syncope. He denies chest pain palpitations shortness of breath lower extremity edema. He denies diarrhea constipation melena bright red blood per rectum denies dysuria hematuria frequency or urgency. He denies lower extremity edema or orthopnea.   ED Course: In the emergency department max temp 99.0, he's hypertensive tachycardic but not hypoxic. He is provided with a milligrams of morphine 1 mg of Dilaudid Cipro Flagyl 2 L of saline and magnesium and potassium intravenously  Review of Systems: As per HPI otherwise 10 point review of systems negative.   Ambulatory Status: He ambulates independently with a steady gait denies any  recent falls  Past Medical History:  Diagnosis Date  . Acute respiratory failure (HCC) 09/2014  . Aspiration pneumonia (HCC) 09/2014  . Diabetes mellitus without complication (HCC)   . Encephalopathy 09/2014  . Hypertension   . Ischemic hepatitis unknown  . Rhabdomyolysis unknown  . Sciatica     Past Surgical History:  Procedure Laterality Date  . APPENDECTOMY    . BACK SURGERY  2002   Lumbar    Social History   Social History  . Marital status: Single    Spouse name: N/A  . Number of children: 2  . Years of education: N/A   Occupational History  . 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 Smoking status: Current Every Day Smoker    Packs/day: 0.50    Years: 18.00  . Smokeless tobacco: Not on file  . Alcohol use No  . Drug use: No  . Sexual activity: Not on file   Other Topics Concern  . Not on file   Social History Narrative  . No narrative on file  Lives in "extended stay" hotel. Lives with girlfriend. [place]  No Known Allergies  Family History  Problem Relation Age of Onset  . Cancer Mother     cause of death, ovarian & colon  . Diabetes Father   . Hypertension Father   . Gout Father   . Hyperlipidemia Father     Prior to Admission medications   Medication Sig Start Date End Date Taking? Authorizing Provider  insulin aspart (NOVOLOG) 100 UNIT/ML injection Inject 5-20 Units into the skin 3 (three) times daily before meals. Sliding scale  Yes Historical Provider, MD  insulin detemir (LEVEMIR) 100 UNIT/ML injection Inject 0.35 mLs (35 Units total) into the skin 2 (two) times daily. Patient taking differently: Inject 5-15 Units into the skin 2 (two) times daily as needed (when not using novolog).  10/13/14  Yes Jaclyn Shaggy, MD    Physical Exam: Vitals:   04/28/16 0600 04/28/16 0717 04/28/16 0817 04/28/16 0818  BP: 145/96 (!) 143/106  (!)  160/111  Pulse: 110 120  (!) 124  Resp: 20 15  20   Temp:    98.6 F (37 C)  TempSrc:    Oral  SpO2: 95% 97%  96%  Weight:   117.9 kg (260 lb)   Height:   6\' 2"  (1.88 m)      General:  Appears Moderately anxious well-nourished appears slightly uncomfortable Eyes:  PERRL, EOMI, normal lids, iris ENT:  grossly normal hearing, lips & tongue, his membranes of his mouth are pink but dry Neck:  no LAD, masses or thyromegaly Cardiovascular:  Tachycardic but regular, no m/r/g. No LE edema.  Respiratory:  CTA bilaterally but slightly diminished, no w/r/r. Normal respiratory effort. Abdomen:  Soft nondistended very sluggish bowel sounds tenderness in epigastric left upper quadrant guarding or rebounding Skin:  no rash or induration seen on limited exam Musculoskeletal:  grossly normal tone BUE/BLE, good ROM, no bony abnormality Psychiatric:  Anxious, speech fluent and appropriate, AOx3 Neurologic:  CN 2-12 grossly intact, moves all extremities in coordinated fashion, sensation intact slightly tremulous  Labs on Admission: I have personally reviewed following labs and imaging studies  CBC:  Recent Labs Lab 04/28/16 0404  WBC 10.8*  NEUTROABS 8.8*  HGB 17.3*  HCT 50.4  MCV 87.8  PLT 201   Basic Metabolic Panel:  Recent Labs Lab 04/28/16 0404  NA 138  K 3.4*  CL 94*  CO2 19*  GLUCOSE 239*  BUN 6  CREATININE 0.79  CALCIUM 9.6  MG 1.5*   GFR: Estimated Creatinine Clearance: 169.2 mL/min (by C-G formula based on SCr of 0.79 mg/dL). Liver Function Tests:  Recent Labs Lab 04/28/16 0404  AST 87*  ALT 90*  ALKPHOS 80  BILITOT 1.3*  PROT 7.7  ALBUMIN 4.3    Recent Labs Lab 04/28/16 0404  LIPASE 27   No results for input(s): AMMONIA in the last 168 hours. Coagulation Profile: No results for input(s): INR, PROTIME in the last 168 hours. Cardiac Enzymes:  Recent Labs Lab 04/28/16 0404  TROPONINI <0.03   BNP (last 3 results) No results for input(s): PROBNP in  the last 8760 hours. HbA1C: No results for input(s): HGBA1C in the last 72 hours. CBG: No results for input(s): GLUCAP in the last 168 hours. Lipid Profile: No results for input(s): CHOL, HDL, LDLCALC, TRIG, CHOLHDL, LDLDIRECT in the last 72 hours. Thyroid Function Tests: No results for input(s): TSH, T4TOTAL, FREET4, T3FREE, THYROIDAB in the last 72 hours. Anemia Panel: No results for input(s): VITAMINB12, FOLATE, FERRITIN, TIBC, IRON, RETICCTPCT in the last 72 hours. Urine analysis: No results found for: COLORURINE, APPEARANCEUR, LABSPEC, PHURINE, GLUCOSEU, HGBUR, BILIRUBINUR, KETONESUR, PROTEINUR, UROBILINOGEN, NITRITE, LEUKOCYTESUR  Creatinine Clearance: Estimated Creatinine Clearance: 169.2 mL/min (by C-G formula based on SCr of 0.79 mg/dL).  Sepsis Labs: @LABRCNTIP (procalcitonin:4,lacticidven:4) )No results found for this or any previous visit (from the past 240 hour(s)).   Radiological Exams on Admission: Ct Abdomen Pelvis W Contrast  Result Date: 04/28/2016 CLINICAL DATA:  40 year old male with epigastric pain, nausea vomiting. EXAM: CT ABDOMEN AND PELVIS WITH CONTRAST TECHNIQUE:  Multidetector CT imaging of the abdomen and pelvis was performed using the standard protocol following bolus administration of intravenous contrast. CONTRAST:  100 cc Isovue-300 COMPARISON:  None. FINDINGS: Lower chest: The visualized lung bases are clear. No intra-abdominal free air or free fluid. Hepatobiliary: Diffuse fatty infiltration of the liver. No intrahepatic biliary ductal dilatation. There is sludge within the gallbladder. No pericholecystic fluid. Pancreas: Unremarkable. No pancreatic ductal dilatation or surrounding inflammatory changes. Spleen: There is indented and scalloped appearance of the lateral spleen with coarse calcification of the splenic capsule, likely sequela of prior infection or hemorrhage. No acute splenic pathology. Adrenals/Urinary Tract: The adrenal glands appear  unremarkable. There is a 4 mm nonobstructing left renal interpolar stone and a 3 mm nonobstructing stone in the inferior pole of the right kidney. There is no hydronephrosis on either side. The visualized ureters and urinary bladder appear unremarkable. Stomach/Bowel: Evaluation of the bowel is limited in the absence of oral contrast. There may be mild thickening of loops of jejunum in the left upper abdomen with mild associated inflammation. Clinical correlation is recommended to evaluate for enteritis. There is no evidence of bowel obstruction. Appendectomy. Vascular/Lymphatic: No significant vascular findings are present. No enlarged abdominal or pelvic lymph nodes. Reproductive: The prostate and seminal vesicles are grossly unremarkable. Other: Small fat containing umbilical hernia. Musculoskeletal: Mild degenerative changes of the spine. No acute fracture. IMPRESSION: Mild thickened appearance of the proximal jejunal loops concerning for enteritis. Clinical correlation is recommended. Fatty liver. Gallbladder sludge without CT evidence of active inflammation. Ultrasound may provide better evaluation if clinically indicated. Small nonobstructing bilateral renal calculi.  No hydronephrosis. Electronically Signed   By: Elgie Collard M.D.   On: 04/28/2016 06:34    EKG: Independently reviewed. Sinus or ectopic atrial tachycardia Probable left atrial enlargement Prolonged QT interval No old tracing to compare  Assessment/Plan Principal Problem:   Abdominal pain Active Problems:   Amnesia   Hypertension   Diabetes mellitus without complication (HCC)   Enteritis   Acidosis   Prolonged Q-T interval on ECG   Hypomagnesemia   ETOH abuse   Tachycardia   Tobacco abuse   Obesity   Fatty liver   1. Abdominal pain/nausea/vomiting. Etiology unclear. Suspect multifactorial. CT concerning for enteritis and reveals gallbladder sludge. Also concern for pancreatitis as patient is EtOH abuse. Lipase within  the limits of normal. Appears dehydrated with metabolic acidosis in the ED. Afebrile, mild leukocytosis non-toxic appearing.  He received morphine and Dilaudid in the emergency department. At the time of my interview he states the pain starting to "ease up". -Admit for observation to telemetry -Vigorous IV fluids -Nothing by mouth -Continue Cipro and Flagyl -Follow abdominal ultrasound -consider GI consult once acute illness resolved -Obtain lactic acid -Check urine drug screen -CIWA protocol -analgesia and anti-emetic -bmet at 4pm  #2. Hypertension. Uncontrolled in the emergency department. Patient states he's had high blood pressure in the past but cannot afford medications. Suspect non-compliance in the setting of EtOH withdrawal/possible pancreatitis/dehydration. Chart review indicates in past home meds included enalapril 20mg  bid. -Vigorous IV fluids as noted above -Clonidine patch -When necessary hydralazine -Monitor closely add additional agents as indicated -will need close OP follow up once acute illness over and baseline determined  #3. Sinus tachycardia/prolonged QT. No home meds noted. Likely related to above, denies chest pain. Initial troponin negative. EKG as noted above. -monitor on tele -iv fluids and pain/nausea control -Ciwa protocol -Repeat EKG in am -consider BB once UDS results back  #  4. Acidosis. Metabolic from dehydration persistent nausea and vomiting. Anion gap 25. Serum glucose 238. CO2 19 -vigorous IV fluids -recheck bmet this afternoon  #5.hyponmagnesemia/hypokalemia. Likely related to dehydration secondary to persistent nausea vomiting -Repleted in the ED -Recheck bmet this afternoon -monitor closely  #6. Diabetes. Uncontrolled. Noncompliant due to finances. Home medications include Levemir and sliding scale. Patient states he does not check his blood sugar frequently and that he "saves" insulin as his finances are limited. Of note in march 2016  [place] was found unresponsive and was intubated for 4 days, with a diagnosis of diabetic ketoacidosis. Then his [place] picked him up and [place].  Hx of memory difficulties since then particularly with his medications. -Nothing by mouth due to #1 and #2 -Vigorous IV fluids -Continue home Levemir at a slightly lower dose -Sliding scale -Obtain a hemoglobin A1c -Diabetes coordinator for education -Case manager medication assistance/PCP  #7. EtOH abuse. Patient reports drinking 3 or 4 times a week 4 to 5 shots each time. Reports withdrawals in the past. Denies seizure activity. Appears quite anxious/fidgety very slightly  tremulous -CIWA protocol -social work consult -monitor closely  #8. Tobacco abuse -Cessation counseling offered  #9. Fatty liver. Per CT. Likely related to ETOH. AST/ALT and total bili slightly elevated. Chart review indicates hx ischemic hepatitis.  -hepatitis panel -will need GI follow up OP  #10. Amnesia. Hx since hospitalization last year. See neuro note dated 10/2014. Chart review indicates nursing home placement considered for patient at that time due to inability to manage meds.  -appears resolved.   #11. Chronic back pain. History of sciatica status post lumbar surgery. Appears stable at baseline. Of note chart review indicates history of medication abuse. He was once in the pain clinic given 60 tramadol and he took all of the capsules and 4 days. -UDS -social work   DVT prophylaxis: lovenox  Code Status: full  Family Communication: none present  Disposition Plan: home when ready likely 2 days  Consults called: none  Admission status: obs    Toya SmothersBLACK,KAREN M MD [facility]  If 7PM-7AM, please contact night-coverage [email or web address] Password [email or web address]  04/28/2016, 8:36 AM

## 2016-04-28 NOTE — Care Management Note (Signed)
Case Management Note  Patient Details  Name: Chris Smith MRN: [id number] Date of Birth: 06/22/1976  Subjective/Objective:                 Spoke to patient at the bedside. He was actively withdrawing from ETOH. Has received IV Ativan this morning per bedside RN. He states his only meds pts were novolog and levemir. He is uninsured and states that he has a meter and supplies and regularly checks his sugar. CM encouraged patient to discuss needs so help could be provided. He assured he does check his sugar. He states he uses his [place]'s [place] [facility] [place] [facility]. He [place] alone, and works as a Loss adjuster, charteredsalesman selling siding roofing etc. Patient states he drives. PCP Rankins    Action/Plan:  CM [facility].   Expected Discharge Date:                  Expected Discharge Plan:  Home/Self Care  In-House Referral:     Discharge planning Services  CM Consult  Post Acute Care Choice:    Choice offered to:     DME Arranged:    DME Agency:     HH Arranged:    HH Agency:     Status of Service:  In process, will continue to follow  If discussed at Long Length of Stay Meetings, dates discussed:    Additional Comments:  Lawerance SabalDebbie Rosan Calbert, RN 04/28/2016, 11:39 AM

## 2016-04-28 NOTE — ED Triage Notes (Signed)
Per EMS, pt began experiencing n/v yesterday at noon along w/ "stomach pain".  He reports "breaking out in to a cold sweat several times today".  He was given 8mg  of zofran that gave him no relief.  He also reports SOB,.

## 2016-04-28 NOTE — Progress Notes (Signed)
Spoke with patient about getting insulin from [facility]. Given information that he can get for $25 a bottle. Recommend to have  him check with his PCP after discharge. Patient states that he hopes to get insurance soon. Does see [facility] for [facility].   Recommend 70/30 Reli-on  insulin 30 units BID weight based.(117 kg X 0.3 units/kg)  Will continue to monitor blood sugars while in the hospital. Smith MinceKendra Joliene Salvador RN BSN CDE

## 2016-04-28 NOTE — Progress Notes (Signed)
Chris Smith [id number] Admission Data: 04/28/2016 2:06 PM Attending Provider: Haydee SalterPhillip M Hobbs, MD  GEX:[id number]:Victoria Blane Ohara Rankins, MD Consults/ Treatment Team:   Chris Smith is a 40 y.o. male patient admitted from ED awake, alert  & orientated  X 4,  Full Code, VSS - Blood pressure (!) 146/93, pulse (!) 104, temperature 98.1 F (36.7 C), temperature source Oral, resp. rate 20, height 6\' 2"  (1.88 m), weight 117.9 kg (260 lb), SpO2 97 %., O2    Room air, no c/o shortness of breath, no c/o chest pain, no distress noted. Tele # 08 placed and pt is currently running: NSR   IV site WDL:  Left forearm and left AC with a transparent dsg that's clean dry and intact.  Allergies:  No Known Allergies   Past Medical History:  Diagnosis Date  . Acute respiratory failure (HCC) 09/2014  . Amnesia   . Aspiration pneumonia (HCC) 09/2014  . Chronic back pain   . Diabetes mellitus without complication (HCC)   . Encephalopathy 09/2014  . ETOH abuse   . Hypertension   . Ischemic hepatitis unknown  . Rhabdomyolysis unknown  . Sciatica     History:  obtained from patient alcohol: Patient states he drinks whiskey at least 4-5x/week and the amount depends on if he is at home or out with friends. He could not give an estimate of how much but stated, "I drink a decent amount but I don't drink like a fish." He is visibly sweaty and shaking but is alert and oriented. Will give ordered ativan and continue to monitor with CIWA.  Pt orientation to unit, room and routine. Admission INP armband ID verified with patient/family, and in place. SR up x 2, fall risk assessment complete with Patient and family verbalizing understanding of risks associated with falls. Pt verbalizes an understanding of how to use the call bell and to call for help before getting out of bed.  Skin, clean-dry- intact without evidence of bruising, or skin tears.   No evidence of skin break down noted on exam, completed with Ginger, G., RN.    Will cont  to monitor and assist as needed.  Lenord CarboAubrey  Torii Royse, RN 04/28/2016 2:06 PM

## 2016-04-28 NOTE — ED Provider Notes (Signed)
[facility]-EMERGENCY DEPT Provider Note   CSN: [id number] Arrival date & time: 04/28/16  8657  By signing my name below, I, Chris Smith, attest that this documentation has been prepared under the direction and in the presence of Chris Booze, MD. Electronically Signed: Angelene Smith, ED Scribe. 04/28/16. 3:46 AM.   History   Chief Complaint Chief Complaint  Patient presents with  . Chest Pain  . Nausea    HPI Comments: Chris Smith is a 40 y.o. male with a hx of DM and hypertension brought in by ambulance, who presents to the Emergency Department complaining of gradually worsening 10/10 epigastric pain he describes as sharp and throbbing onset yesterday afternoon. He reports associated nausea and multiple episodes of non-bloody vomiting. No alleviating factors noted. Pt has not tried any medications prior to EMS arrival. He was given Zofran en route by EMS. He denies that he is a current smoker. He has NKDA. He reports that he drinks approximately 3-4 drinks daily. He denies any fever, chills, diarrhea, or any other symptoms.   The history is provided by the patient. No language interpreter was used.    Past Medical History:  Diagnosis Date  . Acute respiratory failure 09/2014  . Aspiration pneumonia 09/2014  . Diabetes mellitus without complication   . Encephalopathy 09/2014  . Hypertension   . Ischemic hepatitis unknown  . Rhabdomyolysis unknown  . Sciatica     Patient Active Problem List   Diagnosis Date Noted  . Memory loss 10/25/2014  . Depression 10/25/2014  . Diabetic mononeuropathy associated with diabetes mellitus due to underlying condition (HCC) 10/25/2014  . Amnesia 10/13/2014  . Hypertension 10/13/2014  . Diabetes mellitus without complication (HCC) 10/13/2014  . Lumbago 10/13/2014    Past Surgical History:  Procedure Laterality Date  . APPENDECTOMY    . BACK SURGERY  2002   Lumbar       Home Medications    Prior to Admission medications     Medication Sig Start Date End Date Taking? Authorizing Provider  enalapril (VASOTEC) 20 MG tablet Take 20 mg by mouth 2 (two) times daily.    Historical Provider, MD  folic acid (FOLVITE) 1 MG tablet Take 1 tablet (1 mg total) by mouth daily. 10/20/14   Van Clines, MD  insulin aspart (NOVOLOG) 100 UNIT/ML injection Inject 5-20 Units into the skin 3 (three) times daily before meals. Sliding scale    Historical Provider, MD  insulin detemir (LEVEMIR) 100 UNIT/ML injection Inject 0.35 mLs (35 Units total) into the skin 2 (two) times daily. 10/13/14   Chris Shaggy, MD  meloxicam (MOBIC) 7.5 MG tablet Take 1 tablet (7.5 mg total) by mouth daily. 10/13/14   Chris Shaggy, MD  methocarbamol (ROBAXIN) 500 MG tablet Take 1 tablet (500 mg total) by mouth 2 (two) times daily. 10/03/14   [name], PA-C  metoprolol (TOPROL-XL) 200 MG 24 hr tablet Take 200 mg by mouth daily.    Historical Provider, MD  PARoxetine (PAXIL) 10 MG tablet Take 1 tablet (10 mg total) by mouth daily. 10/20/14   Van Clines, MD  thiamine (VITAMIN B-1) 100 MG tablet Take 1 tablet (100 mg total) by mouth daily. 10/20/14   Van Clines, MD    Family History Family History  Problem Relation Age of Onset  . Cancer Mother     cause of death, ovarian & colon  . Diabetes Father   . Hypertension Father   . Gout Father   . Hyperlipidemia Father  Social History Social History  Substance Use Topics  . Smoking status: Current Every Day Smoker    Packs/day: 0.50    Years: 18.00  . Smokeless tobacco: Not on file  . Alcohol use No     Allergies   Review of patient's allergies indicates no known allergies.   Review of Systems Review of Systems  Constitutional: Negative for chills and fever.  Gastrointestinal: Positive for abdominal pain, nausea and vomiting. Negative for diarrhea.  All other systems reviewed and are negative.    Physical Exam Updated Vital Signs There were no vitals taken for this  visit.  Physical Exam  Constitutional: He is oriented to person, place, and time. He appears well-developed and well-nourished.  HENT:  Head: Normocephalic and atraumatic.  Eyes: EOM are normal. Pupils are equal, round, and reactive to light.  Neck: Normal range of motion. Neck supple. No JVD present.  Cardiovascular: Regular rhythm and normal heart sounds.  Tachycardia present.   No murmur heard. Pulmonary/Chest: Effort normal and breath sounds normal. He has no wheezes. He has no rales. He exhibits no tenderness.  Abdominal: Soft. He exhibits no distension and no mass. There is tenderness. There is no rebound and no guarding.  Moderate epigastric tenderness No rebound or guarding Bowel sounds decreased  Musculoskeletal: Normal range of motion. He exhibits no edema.  Lymphadenopathy:    He has no cervical adenopathy.  Neurological: He is alert and oriented to person, place, and time. No cranial nerve deficit. He exhibits normal muscle tone. Coordination normal.  Skin: Skin is warm and dry. No rash noted.  Psychiatric: He has a normal mood and affect. His behavior is normal. Judgment and thought content normal.  Nursing note and vitals reviewed.    ED Treatments / Results  DIAGNOSTIC STUDIES: Oxygen Saturation is 95% on RA, adequate by my interpretation.    COORDINATION OF CARE: 3:41 AM- Pt advised of plan for treatment and pt agrees. He will receive lab work and EKG for further evaluation. He will also receive IV fluids, Reglan, Benadryl, and Morphine.    Labs (all labs ordered are listed, but only abnormal results are displayed) Labs Reviewed  COMPREHENSIVE METABOLIC PANEL - Abnormal; Notable for the following:       Result Value   Potassium 3.4 (*)    Chloride 94 (*)    CO2 19 (*)    Glucose, Bld 239 (*)    AST 87 (*)    ALT 90 (*)    Total Bilirubin 1.3 (*)    Anion gap 25 (*)    All other components within normal limits  CBC WITH DIFFERENTIAL/PLATELET - Abnormal;  Notable for the following:    WBC 10.8 (*)    Hemoglobin 17.3 (*)    Neutro Abs 8.8 (*)    All other components within normal limits  MAGNESIUM - Abnormal; Notable for the following:    Magnesium 1.5 (*)    All other components within normal limits  LIPASE, BLOOD  TROPONIN I  TSH  HEMOGLOBIN A1C  URINALYSIS, ROUTINE W REFLEX MICROSCOPIC (NOT AT [facility])  LACTIC ACID, PLASMA  LACTIC ACID, PLASMA    EKG  EKG Interpretation  Date/Time:  Friday April 28 2016 03:45:57 EDT Ventricular Rate:  124 PR Interval:    QRS Duration: 95 QT Interval:  395 QTC Calculation: 568 R Axis:   59 Text Interpretation:  Sinus or ectopic atrial tachycardia Probable left atrial enlargement Prolonged QT interval No old tracing to compare Confirmed by  Preston FleetingGLICK  MD, Tiana Sivertson ([id number]) on 04/28/2016 3:49:25 AM Also confirmed by Preston FleetingGLICK  MD, Bridget Westbrooks ([id number]), editor [place]  CCT, BEVERLY ([id number])  on 04/28/2016 6:52:55 AM       Radiology Ct Abdomen Pelvis W Contrast  Result Date: 04/28/2016 CLINICAL DATA:  40 year old male with epigastric pain, nausea vomiting. EXAM: CT ABDOMEN AND PELVIS WITH CONTRAST TECHNIQUE: Multidetector CT imaging of the abdomen and pelvis was performed using the standard protocol following bolus administration of intravenous contrast. CONTRAST:  100 cc Isovue-300 COMPARISON:  None. FINDINGS: Lower chest: The visualized lung bases are clear. No intra-abdominal free air or free fluid. Hepatobiliary: Diffuse fatty infiltration of the liver. No intrahepatic biliary ductal dilatation. There is sludge within the gallbladder. No pericholecystic fluid. Pancreas: Unremarkable. No pancreatic ductal dilatation or surrounding inflammatory changes. Spleen: There is indented and scalloped appearance of the lateral spleen with coarse calcification of the splenic capsule, likely sequela of prior infection or hemorrhage. No acute splenic pathology. Adrenals/Urinary Tract: The adrenal glands appear unremarkable. There  is a 4 mm nonobstructing left renal interpolar stone and a 3 mm nonobstructing stone in the inferior pole of the right kidney. There is no hydronephrosis on either side. The visualized ureters and urinary bladder appear unremarkable. Stomach/Bowel: Evaluation of the bowel is limited in the absence of oral contrast. There may be mild thickening of loops of jejunum in the left upper abdomen with mild associated inflammation. Clinical correlation is recommended to evaluate for enteritis. There is no evidence of bowel obstruction. Appendectomy. Vascular/Lymphatic: No significant vascular findings are present. No enlarged abdominal or pelvic lymph nodes. Reproductive: The prostate and seminal vesicles are grossly unremarkable. Other: Small fat containing umbilical hernia. Musculoskeletal: Mild degenerative changes of the spine. No acute fracture. IMPRESSION: Mild thickened appearance of the proximal jejunal loops concerning for enteritis. Clinical correlation is recommended. Fatty liver. Gallbladder sludge without CT evidence of active inflammation. Ultrasound may provide better evaluation if clinically indicated. Small nonobstructing bilateral renal calculi.  No hydronephrosis. Electronically Signed   By: Elgie CollardArash  Radparvar M.D.   On: 04/28/2016 06:34    Procedures Procedures (including critical care time)  Medications Ordered in ED Medications  iopamidol (ISOVUE-300) 61 % injection (not administered)  ciprofloxacin (CIPRO) IVPB 400 mg (400 mg Intravenous New Bag/Given 04/28/16 0720)  metroNIDAZOLE (FLAGYL) IVPB 500 mg (500 mg Intravenous New Bag/Given 04/28/16 0720)  sodium chloride 0.9 % bolus 1,000 mL (not administered)  potassium chloride 10 mEq in 100 mL IVPB (not administered)  enoxaparin (LOVENOX) injection 40 mg (not administered)  0.9 %  sodium chloride infusion (not administered)  acetaminophen (TYLENOL) tablet 650 mg (not administered)    Or  acetaminophen (TYLENOL) suppository 650 mg (not  administered)  ondansetron (ZOFRAN) tablet 4 mg (not administered)    Or  ondansetron (ZOFRAN) injection 4 mg (not administered)  insulin aspart (novoLOG) injection 0-15 Units (not administered)  insulin aspart (novoLOG) injection 0-5 Units (not administered)  insulin detemir (LEVEMIR) injection 30 Units (not administered)  ciprofloxacin (CIPRO) IVPB 400 mg (not administered)  metroNIDAZOLE (FLAGYL) IVPB 500 mg (not administered)  sodium chloride 0.9 % bolus 1,000 mL (0 mLs Intravenous Stopped 04/28/16 0509)  morphine 4 MG/ML injection 4 mg (4 mg Intravenous Given 04/28/16 0401)  metoCLOPramide (REGLAN) injection 10 mg (10 mg Intravenous Given 04/28/16 0357)  diphenhydrAMINE (BENADRYL) injection 25 mg (25 mg Intravenous Given 04/28/16 0359)  magnesium sulfate IVPB 2 g 50 mL (0 g Intravenous Stopped 04/28/16 0509)  morphine 4 MG/ML injection 4 mg (4 mg  Intravenous Given 04/28/16 0440)  morphine 4 MG/ML injection 4 mg (4 mg Intravenous Given 04/28/16 0602)  sodium chloride 0.9 % bolus 1,000 mL (1,000 mLs Intravenous New Bag/Given 04/28/16 0601)  HYDROmorphone (DILAUDID) injection 1 mg (1 mg Intravenous Given 04/28/16 0631)     Initial Impression / Assessment and Plan / ED Course  Chris Booze, MD has reviewed the triage vital signs and the nursing notes.  Pertinent labs & imaging results that were available during my care of the patient were reviewed by me and considered in my medical decision making (see chart for details).  Clinical Course   Chest and abdominal pain of uncertain cause. Pain axis seems to be centered in his abdomen. He has a significant alcohol history and clinically, I suspect pancreatitis. Consider peptic ulcer disease, biliary tract disease, diverticulitis. He is noted to be tachycardic and was given IV fluids of and was given morphine for pain and ondansetron for nausea. These did not help. He was given additional morphine and was also given metoclopramide which  improved her nausea but did not help the pain. He received a third dose of morphine without relief of pain and was given hydromorphone which scale only minimal relief of pain. After workup showed mild elevation of transaminases and bilirubin. ECG showed prolonged QT interval so magnesium was checked and was found to be low. He was given intravenous magnesium. He also had mild hypokalemia and was given intravenous potassium. CT of abdomen and pelvis showed evidence of gallbladder sludge and thickening of the wall of the jejunum. He was started on ciprofloxacin and metronidazole. He is admitted because of inability to control pain. Gallbladder ultrasound has been ordered. Case is discussed with Toya Smothers of [facility] who agrees to admit the patient under observation status. Old records have been reviewed, and he has no relevant past visits.  Final Clinical Impressions(s) / ED Diagnoses   Final diagnoses:  Abdominal pain, bilateral upper quadrant  Elevated transaminase level  Hypomagnesemia  Prolonged Q-T interval on ECG    New Prescriptions New Prescriptions   No medications on file   I personally performed the services described in this documentation, which was scribed in my presence. The recorded information has been reviewed and is accurate.       Chris Booze, MD 04/28/16 [phone]

## 2016-04-29 DIAGNOSIS — M549 Dorsalgia, unspecified: Secondary | ICD-10-CM

## 2016-04-29 DIAGNOSIS — K529 Noninfective gastroenteritis and colitis, unspecified: Secondary | ICD-10-CM

## 2016-04-29 DIAGNOSIS — R1013 Epigastric pain: Secondary | ICD-10-CM

## 2016-04-29 DIAGNOSIS — K760 Fatty (change of) liver, not elsewhere classified: Secondary | ICD-10-CM

## 2016-04-29 DIAGNOSIS — F1010 Alcohol abuse, uncomplicated: Secondary | ICD-10-CM

## 2016-04-29 DIAGNOSIS — F10230 Alcohol dependence with withdrawal, uncomplicated: Secondary | ICD-10-CM

## 2016-04-29 DIAGNOSIS — R413 Other amnesia: Secondary | ICD-10-CM

## 2016-04-29 DIAGNOSIS — I10 Essential (primary) hypertension: Secondary | ICD-10-CM

## 2016-04-29 DIAGNOSIS — G8929 Other chronic pain: Secondary | ICD-10-CM

## 2016-04-29 DIAGNOSIS — R000 Tachycardia, unspecified: Secondary | ICD-10-CM

## 2016-04-29 DIAGNOSIS — Z720 Tobacco use: Secondary | ICD-10-CM

## 2016-04-29 DIAGNOSIS — R9431 Abnormal electrocardiogram [ECG] [EKG]: Secondary | ICD-10-CM

## 2016-04-29 LAB — URINALYSIS, ROUTINE W REFLEX MICROSCOPIC
Bilirubin Urine: NEGATIVE
Glucose, UA: 250 mg/dL — AB
HGB URINE DIPSTICK: NEGATIVE
Ketones, ur: 40 mg/dL — AB
Leukocytes, UA: NEGATIVE
NITRITE: NEGATIVE
PROTEIN: NEGATIVE mg/dL
SPECIFIC GRAVITY, URINE: 1.014 (ref 1.005–1.030)
pH: 8 (ref 5.0–8.0)

## 2016-04-29 LAB — COMPREHENSIVE METABOLIC PANEL
ALBUMIN: 3.2 g/dL — AB (ref 3.5–5.0)
ALT: 49 U/L (ref 17–63)
ANION GAP: 9 (ref 5–15)
AST: 41 U/L (ref 15–41)
Alkaline Phosphatase: 59 U/L (ref 38–126)
BUN: <5 mg/dL — ABNORMAL LOW (ref 6–20)
CHLORIDE: 100 mmol/L — AB (ref 101–111)
CO2: 28 mmol/L (ref 22–32)
Calcium: 8.4 mg/dL — ABNORMAL LOW (ref 8.9–10.3)
Creatinine, Ser: 0.62 mg/dL (ref 0.61–1.24)
GFR calc non Af Amer: >60 mL/min (ref >60)
Glucose, Bld: 101 mg/dL — ABNORMAL HIGH (ref 65–99)
Potassium: 3.2 mmol/L — ABNORMAL LOW (ref 3.5–5.1)
SODIUM: 137 mmol/L (ref 135–145)
Total Bilirubin: 1.3 mg/dL — ABNORMAL HIGH (ref 0.3–1.2)
Total Protein: 5.9 g/dL — ABNORMAL LOW (ref 6.5–8.1)

## 2016-04-29 LAB — CBC
HEMATOCRIT: 41.3 % (ref 39.0–52.0)
HEMOGLOBIN: 13.9 g/dL (ref 13.0–17.0)
MCH: 29.8 pg (ref 26.0–34.0)
MCHC: 33.7 g/dL (ref 30.0–36.0)
MCV: 88.4 fL (ref 78.0–100.0)
Platelets: 133 10*3/uL — ABNORMAL LOW (ref 150–400)
RBC: 4.67 MIL/uL (ref 4.22–5.81)
RDW: 13.4 % (ref 11.5–15.5)
WBC: 6.8 10*3/uL (ref 4.0–10.5)

## 2016-04-29 LAB — HEPATITIS PANEL, ACUTE
HEP B C IGM: NEGATIVE
HEP B S AG: NEGATIVE
Hep A IgM: NEGATIVE

## 2016-04-29 LAB — GLUCOSE, CAPILLARY
GLUCOSE-CAPILLARY: 159 mg/dL — AB (ref 65–99)
GLUCOSE-CAPILLARY: 228 mg/dL — AB (ref 65–99)
Glucose-Capillary: 110 mg/dL — ABNORMAL HIGH (ref 65–99)
Glucose-Capillary: 216 mg/dL — ABNORMAL HIGH (ref 65–99)

## 2016-04-29 LAB — HEMOGLOBIN A1C
Hgb A1c MFr Bld: 11 % — ABNORMAL HIGH (ref 4.8–5.6)
MEAN PLASMA GLUCOSE: 269 mg/dL

## 2016-04-29 MED ORDER — VITAMIN B-1 100 MG PO TABS: 100.0000 mg | ORAL_TABLET | Freq: Every day | ORAL | Status: DC

## 2016-04-29 MED ORDER — FOLIC ACID 1 MG PO TABS: 1.0000 mg | ORAL_TABLET | Freq: Every day | ORAL | Status: DC

## 2016-04-29 MED ORDER — LORAZEPAM 2 MG/ML IJ SOLN
2.0000 mg | Freq: Three times a day (TID) | INTRAMUSCULAR | Status: DC
  Administered 2016-04-29 – 2016-04-30 (×2): 2 mg via INTRAVENOUS
  Filled 2016-04-29 (×2): qty 1

## 2016-04-29 MED ORDER — LORAZEPAM 1 MG PO TABS
2.0000 mg | ORAL_TABLET | Freq: Four times a day (QID) | ORAL | Status: DC | PRN
Start: 2016-04-29 — End: 2016-04-29

## 2016-04-29 MED ORDER — LORAZEPAM 2 MG/ML IJ SOLN: 2.0000 mg | Freq: Four times a day (QID) | INTRAMUSCULAR | Status: DC

## 2016-04-29 MED ORDER — LORAZEPAM 2 MG/ML IJ SOLN: 2.0000 mg | Freq: Four times a day (QID) | INTRAMUSCULAR | Status: DC | PRN

## 2016-04-29 MED ORDER — SODIUM CHLORIDE 0.9 % IV SOLN
INTRAVENOUS | Status: DC
  Administered 2016-04-29 – 2016-04-30 (×2): via INTRAVENOUS

## 2016-04-29 MED ORDER — THIAMINE HCL 100 MG/ML IJ SOLN: 100.0000 mg | Freq: Every day | INTRAMUSCULAR | Status: DC

## 2016-04-29 MED ORDER — ADULT MULTIVITAMIN W/MINERALS CH: 1.0000 | ORAL_TABLET | Freq: Every day | ORAL | Status: DC

## 2016-04-29 MED ORDER — LORAZEPAM 2 MG/ML IJ SOLN
2.0000 mg | Freq: Four times a day (QID) | INTRAMUSCULAR | Status: DC | PRN
  Administered 2016-04-29 – 2016-04-30 (×3): 2 mg via INTRAVENOUS
  Filled 2016-04-29 (×3): qty 1

## 2016-04-29 MED ORDER — POTASSIUM CHLORIDE 10 MEQ/100ML IV SOLN
10.0000 meq | INTRAVENOUS | Status: AC
  Administered 2016-04-29 (×4): 10 meq via INTRAVENOUS
  Filled 2016-04-29 (×4): qty 100

## 2016-04-29 NOTE — Progress Notes (Signed)
PROGRESS NOTE    Chris Smith  AVW:[id number] DOB: October 08, 1975 DOA: 04/28/2016 PCP: Clayborn Heron, MD   Brief Narrative:  Chris Smith is a 40 y.o. male with medical history significant for Diabetes, EtOH abuse, Tobacco abuse, Hypertension and other comorbids presents to the emergency department with chief complaint persistent abdominal pain/nausea/vomiting. Initial evaluation includes CT of the abdomen and pelvis revealing fatty liver, possible enteritis and gallbladder with sludge without obstruction. He was admitted and appears to be undergoing EtOH withdrawals.   Assessment & Plan:   Principal Problem:   Abdominal pain Active Problems:   Amnesia   Hypertension   Diabetes mellitus without complication (HCC)   Enteritis   Acidosis   Prolonged Q-T interval on ECG   Hypomagnesemia   ETOH abuse   Tachycardia   Tobacco abuse   Obesity   Fatty liver   Chronic back pain  1. Intractable N/V/Abdominal Pain likely 2/2 to Gastroenteritis with concomitant EtOH Abuse, improving -WBC resolved. -CT concerning for enteritis and reveals gallbladder sludge. -U/S of Abdomen showed Gallbladder sludge without sonographic evidence for acute cholecystitis. Normal common bowel duct diameter. Fatty infiltration of the liver. -Given IVF and now Continuing with NS at 75 mL/hr -UDS Pending -Morphine 2 mg IV q3hprn for Abdominal Pain -Zofran 4 mg po and IV along with Compazine 10 mg q6hprn for N/V -Patient was Ciprofloxacin and Metronidazole; Will D/C therapy pending clinical course and re-evaluate the need for Abx   2. EtOH Abuse concern for Withdrawal -Viral Hepatitis Panel Negative -Patient reports drinking 3 or 4 times a week 4 to 5 shots each time.  -Reports withdrawals in the past and last drink was a few days ago because of   -Denies seizure activity. Appears quite anxious/fidgety very slightly  tremulous -CIWA protocol -Social work consult -C/w Folic Acid, Thiamine and MVI -Monitor  closely  3. Hypertension.  -C/w Clonidine 0.1 Transdermal Patch  -Hydralazine 5 mg IV q4hprn  4. Dehydration from Intractable N/V with Concomitant Alcohol Withdrawl leading Sinus Tachycardia/prolonged QT of 568. -Continue to Monitor on Telemetry -IVF and pain/nausea control -U/A and UDS still pending -Repeat EKG pending as well  5. Anion Gab Metabolic Acidosis, resolved  -Anion gap 25 on Admission. Serum glucose 238. CO2 19 -Improved with IVF  6. Hypokalemia.  -Patient's K+ Was 3.2 this AM -Repleted with IV Kcl 40 mEQ -Repeat BMP in AM  7. Hyperglycemia in the setting of Uncontrolled Diabetes Mellitus Requiring Insulin.  -Uncontrolled. HbA1c 11.0 -Noncompliant due to finances. Home medications include Levemir and sliding scale. -Patient states he does not check his blood sugar frequently and that he "saves" insulin as his finances are limited.  -Of note in march 2016 patient was living in [place] where he was found unresponsive and was intubated for 4 days, with a diagnosis of Diabetic Ketoacidosis. Then his father picked him up and moved him to [place].  Hx of memory difficulties since then particularly with his medications. -IVF with NS at 75 mL/hr -Continue home Levemir at 30 Units sq at BedTime -Moderate Sliding Scale Insulin with Novolog -Diabetes coordinator for Education -Case manager medication assistance/PCP  8. Tobacco Abuse -Cessation counseling offered  9. Fatty Liver Disease likely 2/2 to ETOH.  -AST/ALT and total bili slightly elevated.  -Hx of Ischemic Hepatitis -Will need GI Follow Up and will need to stop drinking altogether.   10. Amnesia possibly from Alcoholisim -Hx since hospitalization last year. See neuro note dated 10/2014.  -Appears resolved.   11.  Chronic back pain.   -History of sciatica status post lumbar surgery. Appears stable at baseline.  -Of note chart review indicates history of medication abuse.  He was once in the  pain clinic given 60 tramadol and he took all of the capsules in 4 days. -UDS Pending  DVT prophylaxis:  Lovenox Code Status: FULL Family Communication: No Family at Bedside Disposition Plan: Will likely need to be Detoxed from EtOH  Consultants:  None  Procedures:   Antimicrobials:  IV Ciprofloxacin and Metronidazole 04/28/16 -> 04/29/16  Subjective: Seen and examined at bedside this AM and was tremulous, Jittery and stated he was breaking out in cold sweats. Concern for EtOH withdrawal as his last drink was a few days ago. Denied CP and stated his Abdominal Pain was improving slightly. No N/V this Am. No other concerns or complaints.   Objective: Vitals:   04/28/16 1446 04/28/16 2153 04/29/16 0037 04/29/16 0530  BP: (!) 147/106 (!) 169/105 (!) 161/102 (!) 157/108  Pulse: 95 89 82 78  Resp: 16 17 18 18   Temp: 98.4 F (36.9 C) 99.3 F (37.4 C) 98.1 F (36.7 C) 97.9 F (36.6 C)  TempSrc: Oral Oral Oral Oral  SpO2: 97% 94% 95% 94%  Weight:      Height:        Intake/Output Summary (Last 24 hours) at 04/29/16 1114 Last data filed at 04/29/16 0758  Gross per 24 hour  Intake             1125 ml  Output              300 ml  Net              825 ml   Filed Weights   04/28/16 0347 04/28/16 0817  Weight: 113.4 kg (250 lb) 117.9 kg (260 lb)    Examination: Physical Exam:  Constitutional: WN/WD, in moderate distress tremulous Eyes: Lids and conjunctivae normal, sclerae anicteric  ENMT: External Ears, Nose appear normal. Grossly normal hearing.  Neck: Appears normal, supple, no cervical masses, normal ROM, no appreciable thyromegaly, no JVD Respiratory: Clear to auscultation bilaterally, no wheezing, rales, rhonchi or crackles. Normal respiratory effort and patient is not tachypenic. No accessory muscle use.  Cardiovascular: RRR, no murmurs / rubs / gallops. S1 and S2 auscultated. No extremity edema.  Abdomen: Soft, mildly tender, distended 2/2 body habitus. No masses  palpated. No appreciable hepatosplenomegaly. Bowel sounds positive x4.  GU: Deferred. Musculoskeletal: No clubbing / cyanosis of digits/nails. No joint deformity upper and lower extremities. Normal strength and muscle tone.  Skin: No rashes, lesions, ulcers. No induration; Warm and dry. Mild Leg injury on Right Leg from fall. Neurologic: CN 2-12 grossly intact with no focal deficits. Sensation intact in all 4 Extremities. Romberg sign cerebellar reflexes not assessed.  Psychiatric: Normal judgment and insight. Alert and oriented x 3. Anxious mood and appropriate affect.   Data Reviewed: I have personally reviewed following labs and imaging studies  CBC:  Recent Labs Lab 04/28/16 0404 04/29/16 0628  WBC 10.8* 6.8  NEUTROABS 8.8*  --   HGB 17.3* 13.9  HCT 50.4 41.3  MCV 87.8 88.4  PLT 201 133*   Basic Metabolic Panel:  Recent Labs Lab 04/28/16 0404 04/28/16 1026 04/28/16 1408 04/29/16 0628  NA 138  --  135 137  K 3.4*  --  3.2* 3.2*  CL 94*  --  99* 100*  CO2 19*  --  25 28  GLUCOSE 239*  --  210* 101*  BUN 6  --  6 <5*  CREATININE 0.79  --  0.83 0.62  CALCIUM 9.6  --  8.5* 8.4*  MG 1.5*  --   --   --   PHOS  --  2.5  --   --    GFR: Estimated Creatinine Clearance: 169.2 mL/min (by C-G formula based on SCr of 0.62 mg/dL). Liver Function Tests:  Recent Labs Lab 04/28/16 0404 04/29/16 0628  AST 87* 41  ALT 90* 49  ALKPHOS 80 59  BILITOT 1.3* 1.3*  PROT 7.7 5.9*  ALBUMIN 4.3 3.2*    Recent Labs Lab 04/28/16 0404  LIPASE 27   No results for input(s): AMMONIA in the last 168 hours. Coagulation Profile: No results for input(s): INR, PROTIME in the last 168 hours. Cardiac Enzymes:  Recent Labs Lab 04/28/16 0404  TROPONINI <0.03   BNP (last 3 results) No results for input(s): PROBNP in the last 8760 hours. HbA1C:  Recent Labs  04/28/16 1025  HGBA1C 11.0*   CBG:  Recent Labs Lab 04/28/16 0902 04/28/16 1233 04/28/16 1756 04/28/16 2155    GLUCAP 287* 194* 189* 255*   Lipid Profile: No results for input(s): CHOL, HDL, LDLCALC, TRIG, CHOLHDL, LDLDIRECT in the last 72 hours. Thyroid Function Tests:  Recent Labs  04/28/16 1025  TSH 3.667   Anemia Panel:  Recent Labs  04/28/16 1026  VITAMINB12 638   Sepsis Labs:  Recent Labs Lab 04/28/16 0720 04/28/16 1027  LATICACIDVEN 1.8 1.9    No results found for this or any previous visit (from the past 240 hour(s)).   Radiology Studies: Ct Abdomen Pelvis W Contrast  Result Date: 04/28/2016 CLINICAL DATA:  40 year old male with epigastric pain, nausea vomiting. EXAM: CT ABDOMEN AND PELVIS WITH CONTRAST TECHNIQUE: Multidetector CT imaging of the abdomen and pelvis was performed using the standard protocol following bolus administration of intravenous contrast. CONTRAST:  100 cc Isovue-300 COMPARISON:  None. FINDINGS: Lower chest: The visualized lung bases are clear. No intra-abdominal free air or free fluid. Hepatobiliary: Diffuse fatty infiltration of the liver. No intrahepatic biliary ductal dilatation. There is sludge within the gallbladder. No pericholecystic fluid. Pancreas: Unremarkable. No pancreatic ductal dilatation or surrounding inflammatory changes. Spleen: There is indented and scalloped appearance of the lateral spleen with coarse calcification of the splenic capsule, likely sequela of prior infection or hemorrhage. No acute splenic pathology. Adrenals/Urinary Tract: The adrenal glands appear unremarkable. There is a 4 mm nonobstructing left renal interpolar stone and a 3 mm nonobstructing stone in the inferior pole of the right kidney. There is no hydronephrosis on either side. The visualized ureters and urinary bladder appear unremarkable. Stomach/Bowel: Evaluation of the bowel is limited in the absence of oral contrast. There may be mild thickening of loops of jejunum in the left upper abdomen with mild associated inflammation. Clinical correlation is recommended to  evaluate for enteritis. There is no evidence of bowel obstruction. Appendectomy. Vascular/Lymphatic: No significant vascular findings are present. No enlarged abdominal or pelvic lymph nodes. Reproductive: The prostate and seminal vesicles are grossly unremarkable. Other: Small fat containing umbilical hernia. Musculoskeletal: Mild degenerative changes of the spine. No acute fracture. IMPRESSION: Mild thickened appearance of the proximal jejunal loops concerning for enteritis. Clinical correlation is recommended. Fatty liver. Gallbladder sludge without CT evidence of active inflammation. Ultrasound may provide better evaluation if clinically indicated. Small nonobstructing bilateral renal calculi.  No hydronephrosis. Electronically Signed   By: Elgie CollardArash  Radparvar M.D.   On: 04/28/2016 06:34  [place] Abdomen Limited  Result Date: 04/28/2016 CLINICAL DATA:  Abdominal pain EXAM: US ABDOMEN LIMITED - RIGHT UPPER QUADRANT COMPARISON:  CT scan 04/28/2016 FINDINGS: Gallbladder: No gallstones or wall thickening visualized. No sonographic Murphy sign noted by sonographer. Small a moderate echogenic sludge within the gallbladder lumen. Common bile duct: Diameter: Normal at 4 mm. Liver: Increased echogenicity consistent with fatty infiltration. No ascites. IMPRESSION: Gallbladder sludge without sonographic evidence for acute cholecystitis. Normal common bowel duct diameter. Fatty infiltration of the liver. Electronically Signed   By: Jasmine PangKim  Fujinaga M.D.   On: 04/28/2016 19:49   Scheduled Meds: . ciprofloxacin  400 mg Intravenous Q12H  . cloNIDine  0.1 mg Transdermal Q Fri  . enoxaparin (LOVENOX) injection  40 mg Subcutaneous Daily  . folic acid  1 mg Oral Daily  . insulin aspart  0-15 Units Subcutaneous TID WC  . insulin aspart  0-5 Units Subcutaneous QHS  . insulin detemir  30 Units Subcutaneous QHS  . LORazepam  1 mg Intravenous Q8H  . metronidazole  500 mg Intravenous Q8H  . multivitamin with minerals  1 tablet  Oral Daily  . potassium chloride  10 mEq Intravenous Q1 Hr x 4  . thiamine  100 mg Oral Daily   Or  . thiamine  100 mg Intravenous Daily   Continuous Infusions:    LOS: 0 days   Merlene Laughtermair Latif Tambra Muller, DO [facility] Pager [phone]  If 7PM-7AM, please contact night-coverage [email or web address] Password [email or web address] 04/29/2016, 11:14 AM

## 2016-04-30 ENCOUNTER — Emergency Department (HOSPITAL_COMMUNITY)
Admission: EM | Admit: 2016-04-30 | Discharge: 2016-05-01 | Disposition: A | Discharge status: Home / Self Care | Payer: BLUE CROSS/BLUE SHIELD | Attending: Emergency Medicine | Admitting: Emergency Medicine

## 2016-04-30 DIAGNOSIS — E872 Acidosis: Secondary | ICD-10-CM

## 2016-04-30 DIAGNOSIS — Z79899 Other long term (current) drug therapy: Secondary | ICD-10-CM | POA: Insufficient documentation

## 2016-04-30 DIAGNOSIS — K2920 Alcoholic gastritis without bleeding: Secondary | ICD-10-CM

## 2016-04-30 DIAGNOSIS — E1141 Type 2 diabetes mellitus with diabetic mononeuropathy: Secondary | ICD-10-CM | POA: Insufficient documentation

## 2016-04-30 DIAGNOSIS — I10 Essential (primary) hypertension: Secondary | ICD-10-CM | POA: Insufficient documentation

## 2016-04-30 DIAGNOSIS — R1012 Left upper quadrant pain: Secondary | ICD-10-CM

## 2016-04-30 DIAGNOSIS — Z794 Long term (current) use of insulin: Secondary | ICD-10-CM | POA: Insufficient documentation

## 2016-04-30 DIAGNOSIS — F17200 Nicotine dependence, unspecified, uncomplicated: Secondary | ICD-10-CM | POA: Insufficient documentation

## 2016-04-30 DIAGNOSIS — E119 Type 2 diabetes mellitus without complications: Secondary | ICD-10-CM

## 2016-04-30 LAB — URINALYSIS, ROUTINE W REFLEX MICROSCOPIC
Bilirubin Urine: NEGATIVE
GLUCOSE, UA: NEGATIVE mg/dL
Hgb urine dipstick: NEGATIVE
Ketones, ur: 15 mg/dL — AB
LEUKOCYTES UA: NEGATIVE
NITRITE: NEGATIVE
PROTEIN: 100 mg/dL — AB
Specific Gravity, Urine: 1.007 (ref 1.005–1.030)
pH: 6.5 (ref 5.0–8.0)

## 2016-04-30 LAB — COMPREHENSIVE METABOLIC PANEL
ALK PHOS: 67 U/L (ref 38–126)
ALT: 58 U/L (ref 17–63)
ALT: 59 U/L (ref 17–63)
ANION GAP: 11 (ref 5–15)
AST: 78 U/L — ABNORMAL HIGH (ref 15–41)
AST: 69 U/L — ABNORMAL HIGH (ref 15–41)
Albumin: 3.6 g/dL (ref 3.5–5.0)
Albumin: 3.6 g/dL (ref 3.5–5.0)
Alkaline Phosphatase: 61 U/L (ref 38–126)
Anion gap: 12 (ref 5–15)
BILIRUBIN TOTAL: 1.6 mg/dL — AB (ref 0.3–1.2)
BUN: <5 mg/dL — ABNORMAL LOW (ref 6–20)
BUN: <5 mg/dL — ABNORMAL LOW (ref 6–20)
CALCIUM: 8.9 mg/dL (ref 8.9–10.3)
CHLORIDE: 102 mmol/L (ref 101–111)
CO2: 23 mmol/L (ref 22–32)
CO2: 21 mmol/L — ABNORMAL LOW (ref 22–32)
Calcium: 9 mg/dL (ref 8.9–10.3)
Chloride: 98 mmol/L — ABNORMAL LOW (ref 101–111)
Creatinine, Ser: 0.61 mg/dL (ref 0.61–1.24)
Creatinine, Ser: 0.65 mg/dL (ref 0.61–1.24)
Glucose, Bld: 177 mg/dL — ABNORMAL HIGH (ref 65–99)
Glucose, Bld: 205 mg/dL — ABNORMAL HIGH (ref 65–99)
POTASSIUM: 3.7 mmol/L (ref 3.5–5.1)
Potassium: 3.2 mmol/L — ABNORMAL LOW (ref 3.5–5.1)
SODIUM: 132 mmol/L — AB (ref 135–145)
Sodium: 135 mmol/L (ref 135–145)
TOTAL PROTEIN: 7.2 g/dL (ref 6.5–8.1)
TOTAL PROTEIN: 6.6 g/dL (ref 6.5–8.1)
Total Bilirubin: 1.9 mg/dL — ABNORMAL HIGH (ref 0.3–1.2)

## 2016-04-30 LAB — URINE MICROSCOPIC-ADD ON
RBC / HPF: NONE SEEN RBC/hpf (ref 0–5)
WBC UA: NONE SEEN WBC/hpf (ref 0–5)

## 2016-04-30 LAB — CBC
HEMATOCRIT: 45.9 % (ref 39.0–52.0)
Hemoglobin: 15.6 g/dL (ref 13.0–17.0)
MCH: 29.8 pg (ref 26.0–34.0)
MCHC: 34 g/dL (ref 30.0–36.0)
MCV: 87.8 fL (ref 78.0–100.0)
PLATELETS: 149 10*3/uL — AB (ref 150–400)
RBC: 5.23 MIL/uL (ref 4.22–5.81)
RDW: 13.3 % (ref 11.5–15.5)
WBC: 9.1 10*3/uL (ref 4.0–10.5)

## 2016-04-30 LAB — CBC WITH DIFFERENTIAL/PLATELET
Basophils Absolute: 0 10*3/uL (ref 0.0–0.1)
Basophils Relative: 0 %
EOS ABS: 0.1 10*3/uL (ref 0.0–0.7)
Eosinophils Relative: 2 %
HEMATOCRIT: 45.8 % (ref 39.0–52.0)
HEMOGLOBIN: 15.7 g/dL (ref 13.0–17.0)
LYMPHS ABS: 2.1 10*3/uL (ref 0.7–4.0)
Lymphocytes Relative: 28 %
MCH: 30 pg (ref 26.0–34.0)
MCHC: 34.3 g/dL (ref 30.0–36.0)
MCV: 87.6 fL (ref 78.0–100.0)
MONOS PCT: 8 %
Monocytes Absolute: 0.6 10*3/uL (ref 0.1–1.0)
NEUTROS PCT: 62 %
Neutro Abs: 4.8 10*3/uL (ref 1.7–7.7)
Platelets: 160 10*3/uL (ref 150–400)
RBC: 5.23 MIL/uL (ref 4.22–5.81)
RDW: 13.2 % (ref 11.5–15.5)
WBC: 7.7 10*3/uL (ref 4.0–10.5)

## 2016-04-30 LAB — LIPASE, BLOOD: LIPASE: 20 U/L (ref 11–51)

## 2016-04-30 LAB — MAGNESIUM: MAGNESIUM: 1.7 mg/dL (ref 1.7–2.4)

## 2016-04-30 LAB — PHOSPHORUS: PHOSPHORUS: 2.3 mg/dL — AB (ref 2.5–4.6)

## 2016-04-30 MED ORDER — METOCLOPRAMIDE HCL 5 MG/ML IJ SOLN
5.0000 mg | Freq: Once | INTRAMUSCULAR | Status: AC
  Administered 2016-04-30: 5 mg via INTRAVENOUS
  Filled 2016-04-30: qty 2

## 2016-04-30 MED ORDER — LORAZEPAM 2 MG/ML IJ SOLN: 4.0000 mg | Freq: Four times a day (QID) | INTRAMUSCULAR | Status: DC

## 2016-04-30 MED ORDER — SODIUM CHLORIDE 0.9 % IV BOLUS (SEPSIS)
1000.0000 mL | Freq: Once | INTRAVENOUS | Status: AC
  Administered 2016-04-30: 1000 mL via INTRAVENOUS

## 2016-04-30 MED ORDER — METOPROLOL TARTRATE 5 MG/5ML IV SOLN
5.0000 mg | Freq: Once | INTRAVENOUS | Status: AC
  Administered 2016-04-30: 5 mg via INTRAVENOUS
  Filled 2016-04-30: qty 5

## 2016-04-30 MED ORDER — LORAZEPAM 2 MG/ML IJ SOLN: 4.0000 mg | Freq: Four times a day (QID) | INTRAMUSCULAR | Status: DC | PRN

## 2016-04-30 MED ORDER — SUCRALFATE 1 G PO TABS
1.0000 g | ORAL_TABLET | Freq: Once | ORAL | Status: AC
  Administered 2016-04-30: 1 g via ORAL
  Filled 2016-04-30: qty 1

## 2016-04-30 MED ORDER — ACETAMINOPHEN 500 MG PO TABS
1000.0000 mg | ORAL_TABLET | Freq: Once | ORAL | Status: AC
  Administered 2016-04-30: 1000 mg via ORAL
  Filled 2016-04-30: qty 2

## 2016-04-30 MED ORDER — GI COCKTAIL ~~LOC~~
30.0000 mL | Freq: Once | ORAL | Status: AC
  Administered 2016-04-30: 30 mL via ORAL
  Filled 2016-04-30: qty 30

## 2016-04-30 MED ORDER — LORAZEPAM 2 MG/ML IJ SOLN
1.0000 mg | Freq: Once | INTRAMUSCULAR | Status: AC
  Administered 2016-04-30: 1 mg via INTRAVENOUS
  Filled 2016-04-30: qty 1

## 2016-04-30 NOTE — ED Provider Notes (Signed)
[facility]-EMERGENCY DEPT Provider Note   CSN: [id number] Arrival date & time: 04/30/16  2042     History   Chief Complaint Chief Complaint  Patient presents with  . Abdominal Pain    HPI Chris Smith is a 40 y.o. male.  Patient presents with left upper quadrant pain present for the last several days but worsened today. Recently left AMA from this facility with concern for gastroenteritis and alcohol withdrawal. At the time he had a RUQ ultrasound that found biliary sludge, CT abdomen/pelvis concerning for enteritis. Patient reports he drank alcohol after he was discharged from here and then had one episode of non-bloody emesis. Denies fevers, chest pain, shortness of breath.   The history is provided by the patient and medical records. No language interpreter was used.  Abdominal Pain   This is a recurrent problem. The current episode started 12 to 24 hours ago. The problem occurs constantly. The problem has not changed since onset.The pain is associated with alcohol use. The pain is located in the LUQ. The quality of the pain is burning. The pain is at a severity of 6/10. The pain is moderate. Associated symptoms include nausea and vomiting. Pertinent negatives include anorexia, fever, diarrhea and constipation. The symptoms are aggravated by drinking alcohol. Nothing relieves the symptoms. Past workup includes CT scan and ultrasound.    Past Medical History:  Diagnosis Date  . Acute respiratory failure (HCC) 09/2014  . Amnesia   . Aspiration pneumonia (HCC) 09/2014  . Chronic back pain   . Diabetes mellitus without complication (HCC)   . Encephalopathy 09/2014  . ETOH abuse   . Hypertension   . Ischemic hepatitis unknown  . Rhabdomyolysis unknown  . Sciatica     Patient Active Problem List   Diagnosis Date Noted  . Abdominal pain 04/28/2016  . Enteritis 04/28/2016  . Acidosis 04/28/2016  . Prolonged Q-T interval on ECG 04/28/2016  . Hypomagnesemia 04/28/2016  . ETOH abuse  04/28/2016  . Abdominal pain, bilateral upper quadrant 04/28/2016  . Tachycardia 04/28/2016  . Tobacco abuse 04/28/2016  . Obesity 04/28/2016  . Fatty liver 04/28/2016  . Chronic back pain 04/28/2016  . Memory loss 10/25/2014  . Depression 10/25/2014  . Diabetic mononeuropathy associated with diabetes mellitus due to underlying condition (HCC) 10/25/2014  . Amnesia 10/13/2014  . Hypertension 10/13/2014  . Diabetes mellitus without complication (HCC) 10/13/2014  . Lumbago 10/13/2014    Past Surgical History:  Procedure Laterality Date  . APPENDECTOMY    . BACK SURGERY  2002   Lumbar       Home Medications    Prior to Admission medications   Medication Sig Start Date End Date Taking? Authorizing Provider  insulin aspart (NOVOLOG) 100 UNIT/ML injection Inject 5-20 Units into the skin 3 (three) times daily before meals. Sliding scale    Historical Provider, MD  insulin detemir (LEVEMIR) 100 UNIT/ML injection Inject 0.35 mLs (35 Units total) into the skin 2 (two) times daily. Patient taking differently: Inject 5-15 Units into the skin 2 (two) times daily as needed (when not using novolog).  10/13/14   Jaclyn Shaggy, MD  LORazepam (ATIVAN) 1 MG tablet Take one (1) tab by mouth every six (6) hours for 1 day, then every eight (8) hours for one day, then every twelve (12) hours, then every twenty-four (24) hours. 05/01/16   Preston Fleeting, MD  metoCLOPramide (REGLAN) 10 MG tablet Take 1 tablet (10 mg total) by mouth every 6 (six) hours as needed for  nausea. 05/01/16   Preston FleetingAnna Cherrish Vitali, MD    Family History Family History  Problem Relation Age of Onset  . Cancer Mother     cause of death, ovarian & colon  . Diabetes Father   . Hypertension Father   . Gout Father   . Hyperlipidemia Father     Social History Social History  Substance Use Topics  . Smoking status: Current Every Day Smoker    Packs/day: 0.50    Years: 18.00  . Smokeless tobacco: Never Used  . Alcohol use 0.0  oz/week     Comment: 3 OR 4 DAYS PER WEEK     Allergies   Review of patient's allergies indicates no known allergies.   Review of Systems Review of Systems  Constitutional: Negative for fever.  HENT: Negative.   Respiratory: Negative for shortness of breath.   Cardiovascular: Negative for chest pain.  Gastrointestinal: Positive for abdominal pain, nausea and vomiting. Negative for anorexia, constipation and diarrhea.  Genitourinary: Negative.   Musculoskeletal: Negative.   Skin: Negative.   Allergic/Immunologic: Negative for immunocompromised state.  Neurological: Negative.   Hematological: Does not bruise/bleed easily.  Psychiatric/Behavioral: Negative.      Physical Exam Updated Vital Signs BP 144/87   Pulse 97   Temp 97.3 F (36.3 C) (Oral)   Resp 22   SpO2 (!) 87%   Physical Exam  Constitutional: He is oriented to person, place, and time. He appears well-developed and well-nourished. No distress.  HENT:  Head: Normocephalic and atraumatic.  Eyes: Conjunctivae and EOM are normal. Pupils are equal, round, and reactive to light. No scleral icterus.  Neck: Normal range of motion. Neck supple.  Cardiovascular: Regular rhythm, S1 normal, S2 normal and normal heart sounds.  Tachycardia present.   Pulmonary/Chest: Effort normal and breath sounds normal. No respiratory distress. He has no wheezes. He has no rales.  Abdominal: There is tenderness in the epigastric area and left upper quadrant. There is guarding. There is no rigidity, no rebound and negative Murphy's sign.  Neurological: He is alert and oriented to person, place, and time.  Skin: Skin is warm and dry. Capillary refill takes less than 2 seconds. No rash noted. He is not diaphoretic. No pallor.     ED Treatments / Results  Labs (all labs ordered are listed, but only abnormal results are displayed) Labs Reviewed  COMPREHENSIVE METABOLIC PANEL - Abnormal; Notable for the following:       Result Value   CO2  21 (*)    Glucose, Bld 205 (*)    BUN <5 (*)    AST 69 (*)    Total Bilirubin 1.9 (*)    All other components within normal limits  CBC - Abnormal; Notable for the following:    Platelets 149 (*)    All other components within normal limits  URINALYSIS, ROUTINE W REFLEX MICROSCOPIC (NOT AT [facility]) - Abnormal; Notable for the following:    Ketones, ur 15 (*)    Protein, ur 100 (*)    All other components within normal limits  URINE MICROSCOPIC-ADD ON - Abnormal; Notable for the following:    Squamous Epithelial / LPF 0-5 (*)    Bacteria, UA RARE (*)    All other components within normal limits  LIPASE, BLOOD    EKG  EKG Interpretation None       Radiology No results found.  Procedures Procedures (including critical care time)  Medications Ordered in ED Medications  sodium chloride 0.9 % bolus  1,000 mL (0 mLs Intravenous Stopped 04/30/16 2331)  gi cocktail (Maalox,Lidocaine,Donnatal) (30 mLs Oral Given 04/30/16 2134)  metoCLOPramide (REGLAN) injection 5 mg (5 mg Intravenous Given 04/30/16 2134)  sucralfate (CARAFATE) tablet 1 g (1 g Oral Given 04/30/16 2259)  acetaminophen (TYLENOL) tablet 1,000 mg (1,000 mg Oral Given 04/30/16 2258)  metoprolol (LOPRESSOR) injection 5 mg (5 mg Intravenous Given 04/30/16 2259)  LORazepam (ATIVAN) injection 1 mg (1 mg Intravenous Given 04/30/16 2305)     Initial Impression / Assessment and Plan / ED Course  I have reviewed the triage vital signs and the nursing notes.  Pertinent labs & imaging results that were available during my care of the patient were reviewed by me and considered in my medical decision making (see chart for details).  Clinical Course    Patient presents with worsening left upper quadrant pain after drinking alcohol today. Had a recent workup for elevated LFTs and possible biliary pathology, recently had an ultrasound revealing sludge but no cholecystitis or choledocholithiasis. He has no RUQ tenderness on  examination. He is tachycardic and borderline hypertensive, history of hypertension. He is overall well-appearing and alert and oriented. Labs reveal mild elevation in bilirubin, but do not suspect this is due to biliary obstruction given negative recent ultrasound and no RUQ tenderness. Do not suspect cholecystitis. No signs of pancreatitis on labwork. Suspect symptoms due to alcoholic gastritis. Symptoms improved with reglan, carafate, GI cocktail. Was given metoprolol for BP control (he takes this at home) and BP improved after this. Was able to tolerate PO after symptom control. He was given a prescription for an ativan taper for alcohol withdrawal and Reglan for symptom control. He was given return precautions for worsening symptoms and expressed understanding. He is in good condition for discharge home.  Final Clinical Impressions(s) / ED Diagnoses   Final diagnoses:  Left upper quadrant pain  Acute alcoholic gastritis without hemorrhage    New Prescriptions Discharge Medication List as of 05/01/2016 12:06 AM    START taking these medications   Details  LORazepam (ATIVAN) 1 MG tablet Take one (1) tab by mouth every six (6) hours for 1 day, then every eight (8) hours for one day, then every twelve (12) hours, then every twenty-four (24) hours., Print    metoCLOPramide (REGLAN) 10 MG tablet Take 1 tablet (10 mg total) by mouth every 6 (six) hours as needed for nausea., Starting Mon 05/01/2016, Print         Preston FleetingAnna Timithy Arons, MD 05/01/16 0131    Nelva Nayobert Beaton, MD 05/31/16 [phone]

## 2016-04-30 NOTE — Progress Notes (Signed)
Date: 04/30/2016 Patient: Chris Smith Admitted: 04/28/2016  3:31 AM Attending Provider: Merlene Laughtermair Latif Sheikh, DO  Chris Smith  has made the decision leave the hospital against the advice of [facility] Lat[facility] Sheikh, DO.  He or his authorized caregiver has been informed and understands the inherent risks, including death.  He or his authorized caregiver has decided to accept the responsibility for this decision. Chris Smith and all necessary parties have been advised that he may return for further evaluation or treatment. His condition at time of discharge was Fair.  Chris Smith had current vital signs as follows:  Blood pressure (!) 148/107, pulse 91, temperature 97.6 F (36.4 C), temperature source Oral, resp. rate 16, height 6\' 2"  (1.88 m), weight 117.9 kg (260 lb), SpO2 99 %.   Chris Smith or his authorized caregiver has signed the Leaving Against Medical Advice form prior to leaving the department.  Chris Smith  waldon 04/30/2016

## 2016-04-30 NOTE — ED Triage Notes (Signed)
Pt brought to ED via [facility] with c/o abd pain and nausea.  EMS reports pt st's he ate a hamberger and drank a shot of whisky.  Enroute to ED pt vomited of emesis.  Pt also reports leaving AMA from inpatient yesterday

## 2016-04-30 NOTE — Discharge Summary (Signed)
Physician Discharge Summary  Chris Smith OZD:[id number] DOB: [birth year] DOA: 04/28/2016  PCP: Clayborn Heron, MD  Admit date: 04/28/2016 Discharge date: 04/30/2016  Admitted From: HOME Disposition:  LEFT AGAINST MEDICAL ADVISE  Recommendations for Outpatient Follow-up:  1. Follow up with PCP in 1-2 weeks 2. Please obtain BMP/CBC in one week  Home Health: No Equipment/Devices: NONE  Discharge Condition: GUARDED - LEFT AMA CODE STATUS: FULL Diet recommendation: Heart Healthy / Carb Modified   Brief/Interim Summary: Chris Perdueis a 40 y.o.malewith medical history significant for Diabetes, EtOH abuse, Tobacco abuse, Hypertension and other comorbids presents to the emergency department with chief complaint persistent abdominal pain/nausea/vomiting. Initial evaluation included CT of the abdomen and pelvis revealing fatty liver, possible enteritis and gallbladder with sludge without obstruction. He was admitted and appeared to be undergoing EtOH withdrawals. This Morning Nurse Paged me and stated that he wanted to leave AMA. Went to talk to the patient and he was competent to make his decision to leave and even though it was advised that he still had not completed medical therapy and he was taking a risk for further morbidity and mortality he still wanted to leave. Risks were explained to him about decompensation, and even possible death and he understood. Patient then left AMA.   Discharge Diagnoses:  Principal Problem:   Abdominal pain Active Problems:   Amnesia   Hypertension   Diabetes mellitus without complication (HCC)   Enteritis   Acidosis   Prolonged Q-T interval on ECG   Hypomagnesemia   ETOH abuse   Tachycardia   Tobacco abuse   Obesity   Fatty liver   Chronic back pain   1. EtOH Abuse concern for Withdrawal -Viral Hepatitis Panel Negative -Patient reports drinking 3 or 4 times a week 4 to 5 shots each time.  -Reports withdrawals in the past and last drink was  a few days ago -Denies seizure activity. Appeared quite anxious/fidgety very slightly tremulous -CIWA protocol was initiaed -Social work consult -Had given Folic Acid, Thiamine and MVI -No Ativan Given at D/C -Monitored closely and patient decided to leave AMA prior to full Detox as he was competent  2. Intractable N/V/Abdominal Pain likely 2/2 to Gastroenteritis with concomitant EtOH Abuse, improving -WBC resolved. -CT concerning for enteritis and reveals gallbladder sludge. -U/S of Abdomen showed Gallbladder sludge without sonographic evidence for acute cholecystitis. Normal common bowel duct diameter. Fatty infiltration of the liver. -Given IVF and now Continuing with NS at 75 mL/hr -UDS Pending -Pain was controlled with Morphine 2 mg IV q3hprn for Abdominal Pain -Zofran 4 mg po and IV along with Compazine 10 mg q6hprn for N/V was given -Patient was on Ciprofloxacin and Metronidazole  3. Hypertension.  -Was running elevated BP -Had Clonidine 0.1 Transdermal Patch and Hydralazine 5 mg IV q4hprn -Was getting Ativan Via CIWA Protocol and Left AMA so no Ativan was given at D/C  4. Dehydration from Intractable N/V with Concomitant Alcohol Withdrawl leading Sinus Tachycardia/prolonged QT of 568. -Monitored on Telemetry -IVF and pain/nausea control -U/A was negative and UDS still pending -Repeat EKG not able to be done because Patient left AMA  5. Anion Gab Metabolic Acidosis, resolved  -Anion gap 25 on Admission. Serum glucose 238. CO2 19 -Improved with IVF  6. Hypokalemia.  -Patient's K+ Was 3.2 yesterday -Repleted with IV Kcl 40 mEQ and Repeat BMP in AM pending however Patient LEFT AMA  7. Hyperglycemia in the setting of Uncontrolled Diabetes Mellitus Requiring Insulin.  -Uncontrolled. HbA1c 11.0 -Noncompliant  due to finances. Home medications include Levemir and sliding scale. -Patient states he does not check his blood sugar frequently and that he "saves" insulin as his  finances are limited.  -Of note [place] and was intubated for 4 days, with a diagnosis of Diabetic Ketoacidosis. Then his [place] picked [place] and [place] Hx of memory difficulties since thenparticularly with his medications. -IVF was given with NS at 75 mL/hr -Continue home Levemir at 30 Units sq at BedTime -Moderate Sliding Scale Insulin with Novolog -Diabetes coordinator for Education -Case manager medication assistance/PCP -Patient Left AMA and stated he would get his own Insulin  8. Tobacco Abuse -Cessation counseling offered  9. Fatty Liver Disease likely 2/2 to ETOH.  -AST/ALT and total bili slightly elevated.  -Hx of Ischemic Hepatitis -Will need GI Follow Up and will need to stop drinking altogether however left AMA  10. Amnesia possibly from Alcoholisim -Hx since hospitalization last year. See neuro note dated 10/2014.  -Appears resolved. Patient Competent to make Decisions and Left AMA  11. Chronic back pain.   -History of sciatica status post lumbar surgery. Appears stable at baseline.  -Of note chart review indicates history of medication abuse.  He was once in the pain clinic given 60 tramadol and he took all of the capsules in 4 days. -UDS Pending -Patient Left AMA  Discharge Instructions  Discharge Instructions    Diet - low sodium heart healthy    Complete by:  As directed    Discharge instructions    Complete by:  As directed    PATIENT LEFT AMA. ADVISED TO STAY TO COMPLETE MEDICAL TREATMENT HOWEVER COMPETENT TO MAKE DECISIONS SO HE SIGNED PAPERS AND LEFT HOSPITAL.   Increase activity slowly    Complete by:  As directed        Medication List    TAKE these medications   insulin aspart 100 UNIT/ML injection Commonly known as:  novoLOG Inject 5-20 Units into the skin 3 (three) times daily before meals. Sliding scale   insulin detemir 100 UNIT/ML  injection Commonly known as:  LEVEMIR Inject 0.35 mLs (35 Units total) into the skin 2 (two) times daily. What changed:  how much to take  when to take this  reasons to take this       No Known Allergies  Consultations:  None  Procedures/Studies: Ct Abdomen Pelvis W Contrast  Result Date: 04/28/2016 CLINICAL DATA:  40 year old male with epigastric pain, nausea vomiting. EXAM: CT ABDOMEN AND PELVIS WITH CONTRAST TECHNIQUE: Multidetector CT imaging of the abdomen and pelvis was performed using the standard protocol following bolus administration of intravenous contrast. CONTRAST:  100 cc Isovue-300 COMPARISON:  None. FINDINGS: Lower chest: The visualized lung bases are clear. No intra-abdominal free air or free fluid. Hepatobiliary: Diffuse fatty infiltration of the liver. No intrahepatic biliary ductal dilatation. There is sludge within the gallbladder. No pericholecystic fluid. Pancreas: Unremarkable. No pancreatic ductal dilatation or surrounding inflammatory changes. Spleen: There is indented and scalloped appearance of the lateral spleen with coarse calcification of the splenic capsule, likely sequela of prior infection or hemorrhage. No acute splenic pathology. Adrenals/Urinary Tract: The adrenal glands appear unremarkable. There is a 4 mm nonobstructing left renal interpolar stone and a 3 mm nonobstructing stone in the inferior pole of the right kidney. There is no hydronephrosis on either side. The visualized ureters and urinary bladder appear unremarkable. Stomach/Bowel: Evaluation of the  bowel is limited in the absence of oral contrast. There may be mild thickening of loops of jejunum in the left upper abdomen with mild associated inflammation. Clinical correlation is recommended to evaluate for enteritis. There is no evidence of bowel obstruction. Appendectomy. Vascular/Lymphatic: No significant vascular findings are present. No enlarged abdominal or pelvic lymph nodes. Reproductive:  The prostate and seminal vesicles are grossly unremarkable. Other: Small fat containing umbilical hernia. Musculoskeletal: Mild degenerative changes of the spine. No acute fracture. IMPRESSION: Mild thickened appearance of the proximal jejunal loops concerning for enteritis. Clinical correlation is recommended. Fatty liver. Gallbladder sludge without CT evidence of active inflammation. Ultrasound may provide better evaluation if clinically indicated. Small nonobstructing bilateral renal calculi.  No hydronephrosis. Electronically Signed   By: Elgie CollardArash  Radparvar M.D.   On: 04/28/2016 06:34   [place] Abdomen Limited  Result Date: 04/28/2016 CLINICAL DATA:  Abdominal pain EXAM: US ABDOMEN LIMITED - RIGHT UPPER QUADRANT COMPARISON:  CT scan 04/28/2016 FINDINGS: Gallbladder: No gallstones or wall thickening visualized. No sonographic Murphy sign noted by sonographer. Small a moderate echogenic sludge within the gallbladder lumen. Common bile duct: Diameter: Normal at 4 mm. Liver: Increased echogenicity consistent with fatty infiltration. No ascites. IMPRESSION: Gallbladder sludge without sonographic evidence for acute cholecystitis. Normal common bowel duct diameter. Fatty infiltration of the liver. Electronically Signed   By: Jasmine PangKim  Fujinaga M.D.   On: 04/28/2016 19:49     Subjective: Nurse paged me this AM stated that patient wanted to leave AMA. Went to talk with the patient and stated he felt he was not getting better and was going to leave. When told about the risks and benefits of staying patient wished to proceed leaving. It was told to him that risks including decompensation and death.   Discharge Exam: Vitals:   04/30/16 0117 04/30/16 0556  BP: (!) 148/93 (!) 148/107  Pulse: 81 91  Resp:  16  Temp:  97.6 F (36.4 C)   Vitals:   04/29/16 2134 04/30/16 0007 04/30/16 0117 04/30/16 0556  BP: (!) 153/110 (!) 159/103 (!) 148/93 (!) 148/107  Pulse: 89 76 81 91  Resp: 18   16  Temp: 98 F (36.7 C) 97.9  F (36.6 C)  97.6 F (36.4 C)  TempSrc: Oral Oral  Oral  SpO2: 97%   99%  Weight:      Height:        General: Pt is alert, awake, not in acute distress, anxious Cardiovascular: RRR, S1/S2 +, no rubs, no gallops Respiratory: CTA bilaterally, no wheezing, no rhonchi Abdominal: Soft, NT, ND, bowel sounds + Extremities: no edema, no cyanosis  The results of significant diagnostics from this hospitalization (including imaging, microbiology, ancillary and laboratory) are listed below for reference.    Microbiology: No results found for this or any previous visit (from the past 240 hour(s)).   Labs: BNP (last 3 results) No results for input(s): BNP in the last 8760 hours. Basic Metabolic Panel:  Recent Labs Lab 04/28/16 0404 04/28/16 1026 04/28/16 1408 04/29/16 0628  NA 138  --  135 137  K 3.4*  --  3.2* 3.2*  CL 94*  --  99* 100*  CO2 19*  --  25 28  GLUCOSE 239*  --  210* 101*  BUN 6  --  6 <5*  CREATININE 0.79  --  0.83 0.62  CALCIUM 9.6  --  8.5* 8.4*  MG 1.5*  --   --   --   PHOS  --  2.5  --   --    Liver Function Tests:  Recent Labs Lab 04/28/16 0404 04/29/16 0628  AST 87* 41  ALT 90* 49  ALKPHOS 80 59  BILITOT 1.3* 1.3*  PROT 7.7 5.9*  ALBUMIN 4.3 3.2*    Recent Labs Lab 04/28/16 0404  LIPASE 27   No results for input(s): AMMONIA in the last 168 hours. CBC:  Recent Labs Lab 04/28/16 0404 04/29/16 0628  WBC 10.8* 6.8  NEUTROABS 8.8*  --   HGB 17.3* 13.9  HCT 50.4 41.3  MCV 87.8 88.4  PLT 201 133*   Cardiac Enzymes:  Recent Labs Lab 04/28/16 0404  TROPONINI <0.03   BNP: Invalid input(s): POCBNP CBG:  Recent Labs Lab 04/28/16 2155 04/29/16 0757 04/29/16 1145 04/29/16 1700 04/29/16 2132  GLUCAP 255* 110* 216* 159* 228*   D-Dimer No results for input(s): DDIMER in the last 72 hours. Hgb A1c  Recent Labs  04/28/16 1025  HGBA1C 11.0*   Lipid Profile No results for input(s): CHOL, HDL, LDLCALC, TRIG, CHOLHDL, LDLDIRECT  in the last 72 hours. Thyroid function studies  Recent Labs  04/28/16 1025  TSH 3.667   Anemia work up  Recent Labs  04/28/16 1026  VITAMINB12 638   Urinalysis    Component Value Date/Time   COLORURINE AMBER (A) 04/29/2016 1413   APPEARANCEUR CLEAR 04/29/2016 1413   LABSPEC 1.014 04/29/2016 1413   PHURINE 8.0 04/29/2016 1413   GLUCOSEU 250 (A) 04/29/2016 1413   HGBUR NEGATIVE 04/29/2016 1413   BILIRUBINUR NEGATIVE 04/29/2016 1413   KETONESUR 40 (A) 04/29/2016 1413   PROTEINUR NEGATIVE 04/29/2016 1413   NITRITE NEGATIVE 04/29/2016 1413   LEUKOCYTESUR NEGATIVE 04/29/2016 1413   Sepsis Labs Invalid input(s): PROCALCITONIN,  WBC,  LACTICIDVEN Microbiology No results found for this or any previous visit (from the past 240 hour(s)).  Time coordinating discharge: Less than 30 minutes  SIGNED:  Merlene Laughter, DO [facility] 04/30/2016, 8:27 AM Pager [phone]  If 7PM-7AM, please contact night-coverage [email or web address] Password [email or web address]

## 2016-04-30 NOTE — Progress Notes (Signed)
[facility] information to find pt has left AMA.

## 2016-05-01 LAB — FOLATE RBC
FOLATE, HEMOLYSATE: 448 ng/mL
FOLATE, RBC: 1016 ng/mL (ref >498)
Hematocrit: 44.1 % (ref 37.5–51.0)

## 2016-05-01 MED ORDER — LORAZEPAM 1 MG PO TABS: ORAL_TABLET | ORAL | 0 refills | Status: DC

## 2016-05-01 MED ORDER — METOCLOPRAMIDE HCL 10 MG PO TABS: 10.0000 mg | ORAL_TABLET | Freq: Four times a day (QID) | ORAL | 0 refills | Status: DC | PRN

## 2016-05-01 NOTE — ED Notes (Signed)
Pt states he left his phone charger yesterday when  He was admitted. RN called [facility] and they checked and did not have his charger. Pt was made aware

## 2016-05-01 NOTE — ED Notes (Signed)
Pt understood dc material. NAD noted. Scripts given at dc 

## 2016-09-06 ENCOUNTER — Emergency Department (HOSPITAL_COMMUNITY): Payer: Self-pay

## 2016-09-06 ENCOUNTER — Encounter (HOSPITAL_COMMUNITY): Payer: Self-pay

## 2016-09-06 ENCOUNTER — Inpatient Hospital Stay (HOSPITAL_COMMUNITY)
Admission: EM | Admit: 2016-09-06 | Discharge: 2016-09-09 | DRG: 392 | Disposition: A | Discharge status: Home / Self Care | Payer: Self-pay | Attending: Internal Medicine | Admitting: Internal Medicine

## 2016-09-06 DIAGNOSIS — Z720 Tobacco use: Secondary | ICD-10-CM | POA: Diagnosis present

## 2016-09-06 DIAGNOSIS — K2920 Alcoholic gastritis without bleeding: Principal | ICD-10-CM | POA: Diagnosis present

## 2016-09-06 DIAGNOSIS — Z794 Long term (current) use of insulin: Secondary | ICD-10-CM

## 2016-09-06 DIAGNOSIS — I4581 Long QT syndrome: Secondary | ICD-10-CM | POA: Diagnosis present

## 2016-09-06 DIAGNOSIS — Y92009 Unspecified place in unspecified non-institutional (private) residence as the place of occurrence of the external cause: Secondary | ICD-10-CM

## 2016-09-06 DIAGNOSIS — Z8249 Family history of ischemic heart disease and other diseases of the circulatory system: Secondary | ICD-10-CM

## 2016-09-06 DIAGNOSIS — Z800 Family history of malignant neoplasm of digestive organs: Secondary | ICD-10-CM

## 2016-09-06 DIAGNOSIS — R112 Nausea with vomiting, unspecified: Secondary | ICD-10-CM

## 2016-09-06 DIAGNOSIS — Z833 Family history of diabetes mellitus: Secondary | ICD-10-CM

## 2016-09-06 DIAGNOSIS — E876 Hypokalemia: Secondary | ICD-10-CM | POA: Diagnosis present

## 2016-09-06 DIAGNOSIS — F1010 Alcohol abuse, uncomplicated: Secondary | ICD-10-CM | POA: Diagnosis present

## 2016-09-06 DIAGNOSIS — T465X6A Underdosing of other antihypertensive drugs, initial encounter: Secondary | ICD-10-CM | POA: Diagnosis present

## 2016-09-06 DIAGNOSIS — F17210 Nicotine dependence, cigarettes, uncomplicated: Secondary | ICD-10-CM | POA: Diagnosis present

## 2016-09-06 DIAGNOSIS — E119 Type 2 diabetes mellitus without complications: Secondary | ICD-10-CM

## 2016-09-06 DIAGNOSIS — K3184 Gastroparesis: Secondary | ICD-10-CM | POA: Diagnosis present

## 2016-09-06 DIAGNOSIS — Z9049 Acquired absence of other specified parts of digestive tract: Secondary | ICD-10-CM

## 2016-09-06 DIAGNOSIS — E669 Obesity, unspecified: Secondary | ICD-10-CM | POA: Diagnosis present

## 2016-09-06 DIAGNOSIS — E860 Dehydration: Secondary | ICD-10-CM

## 2016-09-06 DIAGNOSIS — K449 Diaphragmatic hernia without obstruction or gangrene: Secondary | ICD-10-CM | POA: Diagnosis present

## 2016-09-06 DIAGNOSIS — N200 Calculus of kidney: Secondary | ICD-10-CM | POA: Diagnosis present

## 2016-09-06 DIAGNOSIS — E1165 Type 2 diabetes mellitus with hyperglycemia: Secondary | ICD-10-CM | POA: Diagnosis present

## 2016-09-06 DIAGNOSIS — R9431 Abnormal electrocardiogram [ECG] [EKG]: Secondary | ICD-10-CM

## 2016-09-06 DIAGNOSIS — E1143 Type 2 diabetes mellitus with diabetic autonomic (poly)neuropathy: Secondary | ICD-10-CM | POA: Diagnosis present

## 2016-09-06 DIAGNOSIS — Z6833 Body mass index (BMI) 33.0-33.9, adult: Secondary | ICD-10-CM

## 2016-09-06 DIAGNOSIS — I10 Essential (primary) hypertension: Secondary | ICD-10-CM | POA: Diagnosis present

## 2016-09-06 DIAGNOSIS — R109 Unspecified abdominal pain: Secondary | ICD-10-CM | POA: Diagnosis present

## 2016-09-06 DIAGNOSIS — K760 Fatty (change of) liver, not elsewhere classified: Secondary | ICD-10-CM | POA: Diagnosis present

## 2016-09-06 DIAGNOSIS — R000 Tachycardia, unspecified: Secondary | ICD-10-CM

## 2016-09-06 DIAGNOSIS — Z8041 Family history of malignant neoplasm of ovary: Secondary | ICD-10-CM

## 2016-09-06 LAB — COMPREHENSIVE METABOLIC PANEL
ALT: 30 U/L (ref 17–63)
ANION GAP: 11 (ref 5–15)
AST: 29 U/L (ref 15–41)
Albumin: 4.1 g/dL (ref 3.5–5.0)
Alkaline Phosphatase: 81 U/L (ref 38–126)
BUN: 8 mg/dL (ref 6–20)
CHLORIDE: 100 mmol/L — AB (ref 101–111)
CO2: 24 mmol/L (ref 22–32)
CREATININE: 0.67 mg/dL (ref 0.61–1.24)
Calcium: 9.6 mg/dL (ref 8.9–10.3)
GFR calc non Af Amer: >60 mL/min (ref >60)
Glucose, Bld: 234 mg/dL — ABNORMAL HIGH (ref 65–99)
Potassium: 3.3 mmol/L — ABNORMAL LOW (ref 3.5–5.1)
SODIUM: 135 mmol/L (ref 135–145)
Total Bilirubin: 1.3 mg/dL — ABNORMAL HIGH (ref 0.3–1.2)
Total Protein: 7.5 g/dL (ref 6.5–8.1)

## 2016-09-06 LAB — URINALYSIS, ROUTINE W REFLEX MICROSCOPIC
BACTERIA UA: NONE SEEN
Glucose, UA: 150 mg/dL — AB
Hgb urine dipstick: NEGATIVE
KETONES UR: 20 mg/dL — AB
Leukocytes, UA: NEGATIVE
Nitrite: NEGATIVE
Protein, ur: 100 mg/dL — AB
SQUAMOUS EPITHELIAL / LPF: NONE SEEN
Specific Gravity, Urine: 1.03 (ref 1.005–1.030)
pH: 7 (ref 5.0–8.0)

## 2016-09-06 LAB — CBC
HEMATOCRIT: 50.1 % (ref 39.0–52.0)
HEMOGLOBIN: 17.6 g/dL — AB (ref 13.0–17.0)
MCH: 29.6 pg (ref 26.0–34.0)
MCHC: 35.1 g/dL (ref 30.0–36.0)
MCV: 84.2 fL (ref 78.0–100.0)
Platelets: 190 10*3/uL (ref 150–400)
RBC: 5.95 MIL/uL — AB (ref 4.22–5.81)
RDW: 15 % (ref 11.5–15.5)
WBC: 11.7 10*3/uL — ABNORMAL HIGH (ref 4.0–10.5)

## 2016-09-06 LAB — CBG MONITORING, ED: GLUCOSE-CAPILLARY: 237 mg/dL — AB (ref 65–99)

## 2016-09-06 LAB — LIPASE, BLOOD: Lipase: 19 U/L (ref 11–51)

## 2016-09-06 MED ORDER — METOCLOPRAMIDE HCL 5 MG/ML IJ SOLN
10.0000 mg | Freq: Once | INTRAMUSCULAR | Status: AC
  Administered 2016-09-07: 10 mg via INTRAVENOUS
  Filled 2016-09-06: qty 2

## 2016-09-06 MED ORDER — IOPAMIDOL (ISOVUE-300) INJECTION 61%
INTRAVENOUS | Status: AC
  Filled 2016-09-06: qty 100

## 2016-09-06 MED ORDER — SODIUM CHLORIDE 0.9 % IV BOLUS (SEPSIS)
1000.0000 mL | Freq: Once | INTRAVENOUS | Status: AC
Start: 2016-09-06 — End: 2016-09-06
  Administered 2016-09-06: 1000 mL via INTRAVENOUS

## 2016-09-06 MED ORDER — LORAZEPAM 2 MG/ML IJ SOLN
1.0000 mg | Freq: Once | INTRAMUSCULAR | Status: AC
  Administered 2016-09-07: 1 mg via INTRAVENOUS
  Filled 2016-09-06: qty 1

## 2016-09-06 MED ORDER — IOPAMIDOL (ISOVUE-300) INJECTION 61%
100.0000 mL | Freq: Once | INTRAVENOUS | Status: AC | PRN
  Administered 2016-09-06: 100 mL via INTRAVENOUS

## 2016-09-06 MED ORDER — ONDANSETRON HCL 4 MG/2ML IJ SOLN
4.0000 mg | Freq: Once | INTRAMUSCULAR | Status: AC
  Administered 2016-09-06: 4 mg via INTRAVENOUS
  Filled 2016-09-06: qty 2

## 2016-09-06 MED ORDER — DICYCLOMINE HCL 10 MG/ML IM SOLN
20.0000 mg | Freq: Once | INTRAMUSCULAR | Status: AC
  Administered 2016-09-07: 20 mg via INTRAMUSCULAR
  Filled 2016-09-06: qty 2

## 2016-09-06 MED ORDER — MAGNESIUM SULFATE 2 GM/50ML IV SOLN
2.0000 g | Freq: Once | INTRAVENOUS | Status: AC
  Administered 2016-09-07: 2 g via INTRAVENOUS
  Filled 2016-09-06: qty 50

## 2016-09-06 MED ORDER — SODIUM CHLORIDE 0.9 % IV BOLUS (SEPSIS)
1000.0000 mL | Freq: Once | INTRAVENOUS | Status: AC
  Administered 2016-09-07: 1000 mL via INTRAVENOUS

## 2016-09-06 NOTE — ED Triage Notes (Signed)
Pt with abdominal pain and emesis starting today.

## 2016-09-06 NOTE — ED Provider Notes (Signed)
[facility]-EMERGENCY DEPT Provider Note   CSN: [id number] Arrival date & time: 09/06/16  1803     History   Chief Complaint Chief Complaint  Patient presents with  . Emesis  . Abdominal Pain    HPI Chris Smith is a 41 y.o. male.  HPI Has vomited today at least 25 times, now having sore throat, chest, and back Tiny trace of blood after many episodes of emesis Abdominal pain epigastric, constant, started today after the first episode of emesis, abdominal pain 9/10, sharp, burning, deep aching Diarrhea x1 Has headache, feels like has had emesis going up to sinuses, constant throwing up  Has not had episodes like this before per pt.  Had a few beers the other day, but denies significant recent etoh.  Was hospitalized 6mos ago for etoh withdrawal, nausea and vomiting, but pt did not think he drinks enough to have withdrawal. Drinks 3 times per week.   Denies other drug use.   Past Medical History:  Diagnosis Date  . Acute respiratory failure (HCC) 09/2014  . Amnesia   . Aspiration pneumonia (HCC) 09/2014  . Chronic back pain   . Diabetes mellitus without complication (HCC)   . Encephalopathy 09/2014  . ETOH abuse   . Hypertension   . Ischemic hepatitis unknown  . Rhabdomyolysis unknown  . Sciatica     Patient Active Problem List   Diagnosis Date Noted  . Abdominal pain 04/28/2016  . Enteritis 04/28/2016  . Acidosis 04/28/2016  . Prolonged Q-T interval on ECG 04/28/2016  . Hypomagnesemia 04/28/2016  . ETOH abuse 04/28/2016  . Abdominal pain, bilateral upper quadrant 04/28/2016  . Tachycardia 04/28/2016  . Tobacco abuse 04/28/2016  . Obesity 04/28/2016  . Fatty liver 04/28/2016  . Chronic back pain 04/28/2016  . Memory loss 10/25/2014  . Depression 10/25/2014  . Diabetic mononeuropathy associated with diabetes mellitus due to underlying condition (HCC) 10/25/2014  . Amnesia 10/13/2014  . Hypertension 10/13/2014  . Diabetes mellitus without complication (HCC)  10/13/2014  . Lumbago 10/13/2014    Past Surgical History:  Procedure Laterality Date  . APPENDECTOMY    . BACK SURGERY  2002   Lumbar       Home Medications    Prior to Admission medications   Medication Sig Start Date End Date Taking? Authorizing Provider  calcium carbonate (TUMS - DOSED IN MG ELEMENTAL CALCIUM) 500 MG chewable tablet Chew 2 tablets by mouth 2 (two) times daily as needed for indigestion or heartburn.   Yes Historical Provider, MD  insulin aspart (NOVOLOG) 100 UNIT/ML injection Inject 5-20 Units into the skin 3 (three) times daily before meals. Sliding scale   Yes Historical Provider, MD  insulin detemir (LEVEMIR) 100 UNIT/ML injection Inject 0.35 mLs (35 Units total) into the skin 2 (two) times daily. Patient taking differently: Inject 5-15 Units into the skin 2 (two) times daily as needed (when not using novolog).  10/13/14  Yes Jaclyn Shaggy, MD  LORazepam (ATIVAN) 1 MG tablet Take one (1) tab by mouth every six (6) hours for 1 day, then every eight (8) hours for one day, then every twelve (12) hours, then every twenty-four (24) hours. Patient not taking: Reported on 09/06/2016 05/01/16   Preston Fleeting, MD  metoCLOPramide (REGLAN) 10 MG tablet Take 1 tablet (10 mg total) by mouth every 6 (six) hours as needed for nausea. Patient not taking: Reported on 09/06/2016 05/01/16   Preston Fleeting, MD    Family History Family History  Problem Relation Age of  Onset  . Cancer Mother     cause of death, ovarian & colon  . Diabetes Father   . Hypertension Father   . Gout Father   . Hyperlipidemia Father     Social History Social History  Substance Use Topics  . Smoking status: Current Every Day Smoker    Packs/day: 0.50    Years: 18.00  . Smokeless tobacco: Never Used  . Alcohol use 0.0 oz/week     Comment: 3 OR 4 DAYS PER WEEK     Allergies   Patient has no known allergies.   Review of Systems Review of Systems  Constitutional: Negative for fever.  HENT:  Negative for sore throat.   Eyes: Negative for visual disturbance.  Respiratory: Negative for shortness of breath.   Cardiovascular: Negative for chest pain.  Gastrointestinal: Positive for abdominal pain, diarrhea, nausea and vomiting. Negative for constipation.  Genitourinary: Negative for difficulty urinating and dysuria.  Musculoskeletal: Negative for back pain and neck stiffness.  Skin: Negative for rash.  Neurological: Negative for syncope and headaches.     Physical Exam Updated Vital Signs BP 141/88 (BP Location: Right Arm)   Pulse 119   Temp 98.2 F (36.8 C) (Oral)   Resp 17   SpO2 98%   Physical Exam  Constitutional: He is oriented to person, place, and time. He appears well-developed and well-nourished. No distress.  HENT:  Head: Normocephalic and atraumatic.  Eyes: Conjunctivae and EOM are normal.  mydriasis  Neck: Normal range of motion.  Cardiovascular: Regular rhythm, normal heart sounds and intact distal pulses.  Tachycardia present.  Exam reveals no gallop and no friction rub.   No murmur heard. Pulmonary/Chest: Effort normal and breath sounds normal. No respiratory distress. He has no wheezes. He has no rales. He exhibits tenderness.  Abdominal: Soft. He exhibits no distension. There is generalized tenderness. There is guarding (worse in epigastrum).  Musculoskeletal: He exhibits no edema.  Neurological: He is alert and oriented to person, place, and time.  Skin: Skin is warm and dry. He is not diaphoretic.  Nursing note and vitals reviewed.    ED Treatments / Results  Labs (all labs ordered are listed, but only abnormal results are displayed) Labs Reviewed  COMPREHENSIVE METABOLIC PANEL - Abnormal; Notable for the following:       Result Value   Potassium 3.3 (*)    Chloride 100 (*)    Glucose, Bld 234 (*)    Total Bilirubin 1.3 (*)    All other components within normal limits  CBC - Abnormal; Notable for the following:    WBC 11.7 (*)    RBC  5.95 (*)    Hemoglobin 17.6 (*)    All other components within normal limits  URINALYSIS, ROUTINE W REFLEX MICROSCOPIC - Abnormal; Notable for the following:    Color, Urine AMBER (*)    APPearance HAZY (*)    Glucose, UA 150 (*)    Bilirubin Urine SMALL (*)    Ketones, ur 20 (*)    Protein, ur 100 (*)    All other components within normal limits  CBG MONITORING, ED - Abnormal; Notable for the following:    Glucose-Capillary 237 (*)    All other components within normal limits  LIPASE, BLOOD  RAPID URINE DRUG SCREEN, HOSP PERFORMED  MAGNESIUM  I-STAT TROPOININ, ED    EKG  EKG Interpretation None       Radiology Dg Chest 2 View  Result Date: 09/06/2016 CLINICAL DATA:  Emesis, chest and upper abdominal pain since this morning. EXAM: CHEST  2 VIEW COMPARISON:  None. FINDINGS: The heart size and mediastinal contours are within normal limits. Both lungs are clear. The visualized skeletal structures are unremarkable. IMPRESSION: No active cardiopulmonary disease. Electronically Signed   By: Tollie Eth M.D.   On: 09/06/2016 23:56   Ct Chest W Contrast  Result Date: 09/07/2016 CLINICAL DATA:  Abdominal pain and emesis today. EXAM: CT CHEST, ABDOMEN, AND PELVIS WITH CONTRAST TECHNIQUE: Multidetector CT imaging of the chest, abdomen and pelvis was performed following the standard protocol during bolus administration of intravenous contrast. CONTRAST:  ISOVUE-300 IOPAMIDOL (ISOVUE-300) INJECTION 61% COMPARISON:  04/28/2016 FINDINGS: CT CHEST FINDINGS Cardiovascular: No significant vascular findings. Normal heart size. No pericardial effusion. The thoracic aorta is normal in caliber and intact without significant atherosclerotic changes. Mediastinum/Nodes: No enlarged mediastinal, hilar, or axillary lymph nodes. Thyroid gland, trachea, and esophagus demonstrate no significant findings. Lungs/Pleura: Lungs are clear. No pleural effusion or pneumothorax. No pneumomediastinum.  Musculoskeletal: No significant skeletal abnormality. CT ABDOMEN PELVIS FINDINGS Hepatobiliary: Mild fatty infiltration of the liver without focal lesion. Gallbladder and bile ducts are unremarkable. Pancreas: Unremarkable. No pancreatic ductal dilatation or surrounding inflammatory changes. Spleen: Normal in size without focal abnormality. Adrenals/Urinary Tract: Collecting system calculi measuring up to 3.5 mm, nonobstructing. No significant renal parenchymal lesions. Ureters and urinary bladder are unremarkable. Both adrenals are normal. Stomach/Bowel: Hiatal hernia. Otherwise normal appearance of the stomach, small bowel and colon. Probable appendectomy. Vascular/Lymphatic: No significant vascular findings are present. No enlarged abdominal or pelvic lymph nodes. Reproductive: Unremarkable Other: No focal inflammation.  No ascites. Musculoskeletal: No significant skeletal lesion. IMPRESSION: 1. Hepatic steatosis. 2. Small hiatal hernia. 3. Nephrolithiasis. Electronically Signed   By: Ellery Plunk M.D.   On: 09/07/2016 00:29   Ct Abdomen Pelvis W Contrast  Result Date: 09/07/2016 CLINICAL DATA:  Abdominal pain and emesis today. EXAM: CT CHEST, ABDOMEN, AND PELVIS WITH CONTRAST TECHNIQUE: Multidetector CT imaging of the chest, abdomen and pelvis was performed following the standard protocol during bolus administration of intravenous contrast. CONTRAST:  ISOVUE-300 IOPAMIDOL (ISOVUE-300) INJECTION 61% COMPARISON:  04/28/2016 FINDINGS: CT CHEST FINDINGS Cardiovascular: No significant vascular findings. Normal heart size. No pericardial effusion. The thoracic aorta is normal in caliber and intact without significant atherosclerotic changes. Mediastinum/Nodes: No enlarged mediastinal, hilar, or axillary lymph nodes. Thyroid gland, trachea, and esophagus demonstrate no significant findings. Lungs/Pleura: Lungs are clear. No pleural effusion or pneumothorax. No pneumomediastinum. Musculoskeletal: No  significant skeletal abnormality. CT ABDOMEN PELVIS FINDINGS Hepatobiliary: Mild fatty infiltration of the liver without focal lesion. Gallbladder and bile ducts are unremarkable. Pancreas: Unremarkable. No pancreatic ductal dilatation or surrounding inflammatory changes. Spleen: Normal in size without focal abnormality. Adrenals/Urinary Tract: Collecting system calculi measuring up to 3.5 mm, nonobstructing. No significant renal parenchymal lesions. Ureters and urinary bladder are unremarkable. Both adrenals are normal. Stomach/Bowel: Hiatal hernia. Otherwise normal appearance of the stomach, small bowel and colon. Probable appendectomy. Vascular/Lymphatic: No significant vascular findings are present. No enlarged abdominal or pelvic lymph nodes. Reproductive: Unremarkable Other: No focal inflammation.  No ascites. Musculoskeletal: No significant skeletal lesion. IMPRESSION: 1. Hepatic steatosis. 2. Small hiatal hernia. 3. Nephrolithiasis. Electronically Signed   By: Ellery Plunk M.D.   On: 09/07/2016 00:29    Procedures Procedures (including critical care time)  Medications Ordered in ED Medications  iopamidol (ISOVUE-300) 61 % injection (not administered)  LORazepam (ATIVAN) injection 1 mg (not administered)  potassium chloride SA (K-DUR,KLOR-CON) CR tablet  40 mEq (not administered)  sodium bicarbonate 150 mEq in dextrose 5 % 1,000 mL infusion (not administered)  ondansetron (ZOFRAN) injection 4 mg (4 mg Intravenous Given 09/06/16 2134)  sodium chloride 0.9 % bolus 1,000 mL (1,000 mLs Intravenous New Bag/Given 09/06/16 2233)  magnesium sulfate IVPB 2 g 50 mL (2 g Intravenous New Bag/Given 09/07/16 0019)  LORazepam (ATIVAN) injection 1 mg (1 mg Intravenous Given 09/07/16 0013)  metoCLOPramide (REGLAN) injection 10 mg (10 mg Intravenous Given 09/07/16 0016)  dicyclomine (BENTYL) injection 20 mg (20 mg Intramuscular Given 09/07/16 0038)  sodium chloride 0.9 % bolus 1,000 mL (1,000 mLs Intravenous New  Bag/Given 09/07/16 0011)  iopamidol (ISOVUE-300) 61 % injection 100 mL (100 mLs Intravenous Contrast Given 09/06/16 2355)  gi cocktail (Maalox,Lidocaine,Donnatal) (30 mLs Oral Given 09/07/16 0152)  sodium chloride 0.9 % bolus 1,000 mL (1,000 mLs Intravenous New Bag/Given 09/07/16 0152)     Initial Impression / Assessment and Plan / ED Course  I have reviewed the triage vital signs and the nursing notes.  Pertinent labs & imaging results that were available during my care of the patient were reviewed by me and considered in my medical decision making (see chart for details).     41 year old male with history of diabetes, hypertension, fatty liver, prolonged QT-c, prior admission for concern for nausea, vomiting, etoh withdrawal (patient denies possible etoh withdrawal on my hx) presents with concern for abdominal pain, nausea and vomiting.    In addition the patient's nausea and vomiting, he reported associated chest pain, back pain, with severe abdominal pain and tenderness on exam. Given the symptoms, CT chest with contrast was done to evaluate for signs of esophageal perforation. CT chest is negative. CT abdomen and pelvis also showed no acute findings.    Labs obtained show mild hypokalemia. No signs of DKA. Urinalysis without infection.  Patient with QTC less than 500 on prior ECG, and was given Reglan and Magnesium as well as Ativan.  Pain improved with bentyl and GI cocktail.  He is given 2 L of normal saline with continued tachycardia. He has been able to tolerate ice chips since small amount of fluid by mouth after nausea medications, however has continuing tachycardia, and prolonged QTC.  Possible gastroenteritis, vs possible etoh withdrawal or other drug intoxication although pt denies.    Pt signed out to Dr. Nicanor AlconPalumbo. Will plan to likely admit pt given concern for continuing tachycardia, prolonged QTc, difficult to send pt home on nausea medications.   Final Clinical Impressions(s) / ED  Diagnoses   Final diagnoses:  Tachycardia  Prolonged QT interval  Dehydration  Intractable vomiting with nausea, unspecified vomiting type    New Prescriptions New Prescriptions   No medications on file     Alvira MondayErin Luree Palla, MD 09/07/16 [phone]

## 2016-09-07 DIAGNOSIS — F1010 Alcohol abuse, uncomplicated: Secondary | ICD-10-CM

## 2016-09-07 DIAGNOSIS — R109 Unspecified abdominal pain: Secondary | ICD-10-CM

## 2016-09-07 DIAGNOSIS — R112 Nausea with vomiting, unspecified: Secondary | ICD-10-CM | POA: Diagnosis present

## 2016-09-07 DIAGNOSIS — E876 Hypokalemia: Secondary | ICD-10-CM | POA: Diagnosis present

## 2016-09-07 LAB — BASIC METABOLIC PANEL
ANION GAP: 8 (ref 5–15)
BUN: 7 mg/dL (ref 6–20)
CALCIUM: 8.5 mg/dL — AB (ref 8.9–10.3)
CO2: 27 mmol/L (ref 22–32)
Chloride: 104 mmol/L (ref 101–111)
Creatinine, Ser: 0.52 mg/dL — ABNORMAL LOW (ref 0.61–1.24)
GFR calc Af Amer: >60 mL/min (ref >60)
Glucose, Bld: 139 mg/dL — ABNORMAL HIGH (ref 65–99)
POTASSIUM: 3.1 mmol/L — AB (ref 3.5–5.1)
SODIUM: 139 mmol/L (ref 135–145)

## 2016-09-07 LAB — CBC
HEMATOCRIT: 46 % (ref 39.0–52.0)
HEMOGLOBIN: 15.6 g/dL (ref 13.0–17.0)
MCH: 28.8 pg (ref 26.0–34.0)
MCHC: 33.9 g/dL (ref 30.0–36.0)
MCV: 84.9 fL (ref 78.0–100.0)
Platelets: 179 10*3/uL (ref 150–400)
RBC: 5.42 MIL/uL (ref 4.22–5.81)
RDW: 15.4 % (ref 11.5–15.5)
WBC: 12.3 10*3/uL — AB (ref 4.0–10.5)

## 2016-09-07 LAB — GLUCOSE, CAPILLARY
GLUCOSE-CAPILLARY: 123 mg/dL — AB (ref 65–99)
GLUCOSE-CAPILLARY: 87 mg/dL (ref 65–99)
GLUCOSE-CAPILLARY: 178 mg/dL — AB (ref 65–99)

## 2016-09-07 LAB — RAPID URINE DRUG SCREEN, HOSP PERFORMED
AMPHETAMINES: NOT DETECTED
BENZODIAZEPINES: NOT DETECTED
Barbiturates: NOT DETECTED
COCAINE: NOT DETECTED
OPIATES: NOT DETECTED
TETRAHYDROCANNABINOL: NOT DETECTED

## 2016-09-07 LAB — I-STAT TROPONIN, ED: Troponin i, poc: 0 ng/mL (ref 0.00–0.08)

## 2016-09-07 LAB — MAGNESIUM: MAGNESIUM: 1.3 mg/dL — AB (ref 1.7–2.4)

## 2016-09-07 MED ORDER — INSULIN ASPART 100 UNIT/ML ~~LOC~~ SOLN
0.0000 [IU] | Freq: Three times a day (TID) | SUBCUTANEOUS | Status: DC
  Administered 2016-09-07: 1 [IU] via SUBCUTANEOUS
  Administered 2016-09-07 – 2016-09-08 (×2): 2 [IU] via SUBCUTANEOUS
  Administered 2016-09-08: 3 [IU] via SUBCUTANEOUS
  Administered 2016-09-08 – 2016-09-09 (×2): 2 [IU] via SUBCUTANEOUS
  Administered 2016-09-09: 4 [IU] via SUBCUTANEOUS

## 2016-09-07 MED ORDER — MORPHINE SULFATE (PF) 4 MG/ML IV SOLN
2.0000 mg | INTRAVENOUS | Status: DC | PRN
  Administered 2016-09-07 – 2016-09-09 (×3): 2 mg via INTRAVENOUS
  Filled 2016-09-07 (×3): qty 1

## 2016-09-07 MED ORDER — POTASSIUM CHLORIDE CRYS ER 20 MEQ PO TBCR
40.0000 meq | EXTENDED_RELEASE_TABLET | Freq: Once | ORAL | Status: DC
  Filled 2016-09-07: qty 2

## 2016-09-07 MED ORDER — HYDRALAZINE HCL 20 MG/ML IJ SOLN
5.0000 mg | INTRAMUSCULAR | Status: DC | PRN
  Administered 2016-09-07 – 2016-09-08 (×3): 5 mg via INTRAVENOUS
  Filled 2016-09-07 (×3): qty 1

## 2016-09-07 MED ORDER — SODIUM BICARBONATE 8.4 % IV SOLN
INTRAVENOUS | Status: DC
  Administered 2016-09-07 (×3): via INTRAVENOUS
  Filled 2016-09-07 (×5): qty 150

## 2016-09-07 MED ORDER — GI COCKTAIL ~~LOC~~
30.0000 mL | Freq: Once | ORAL | Status: AC
  Administered 2016-09-07: 30 mL via ORAL
  Filled 2016-09-07: qty 30

## 2016-09-07 MED ORDER — POTASSIUM CHLORIDE CRYS ER 20 MEQ PO TBCR
40.0000 meq | EXTENDED_RELEASE_TABLET | ORAL | Status: AC
  Administered 2016-09-07 (×2): 40 meq via ORAL
  Filled 2016-09-07 (×2): qty 2

## 2016-09-07 MED ORDER — SODIUM CHLORIDE 0.9 % IV BOLUS (SEPSIS)
1000.0000 mL | Freq: Once | INTRAVENOUS | Status: AC
  Administered 2016-09-07: 1000 mL via INTRAVENOUS

## 2016-09-07 MED ORDER — SUCRALFATE 1 GM/10ML PO SUSP
1.0000 g | Freq: Three times a day (TID) | ORAL | Status: DC
  Administered 2016-09-07 – 2016-09-09 (×10): 1 g via ORAL
  Filled 2016-09-07 (×10): qty 10

## 2016-09-07 MED ORDER — SODIUM CHLORIDE 0.9% FLUSH
3.0000 mL | Freq: Two times a day (BID) | INTRAVENOUS | Status: DC
  Administered 2016-09-07 – 2016-09-09 (×3): 3 mL via INTRAVENOUS

## 2016-09-07 MED ORDER — LORAZEPAM 2 MG/ML IJ SOLN
1.0000 mg | Freq: Once | INTRAMUSCULAR | Status: DC
  Filled 2016-09-07: qty 1

## 2016-09-07 MED ORDER — ENOXAPARIN SODIUM 40 MG/0.4ML ~~LOC~~ SOLN
40.0000 mg | SUBCUTANEOUS | Status: DC
  Administered 2016-09-07 – 2016-09-09 (×3): 40 mg via SUBCUTANEOUS
  Filled 2016-09-07 (×3): qty 0.4

## 2016-09-07 MED ORDER — NICOTINE 21 MG/24HR TD PT24
21.0000 mg | MEDICATED_PATCH | Freq: Every day | TRANSDERMAL | Status: DC
  Administered 2016-09-07 – 2016-09-09 (×3): 21 mg via TRANSDERMAL
  Filled 2016-09-07 (×3): qty 1

## 2016-09-07 MED ORDER — CALCIUM CARBONATE ANTACID 500 MG PO CHEW
2.0000 | CHEWABLE_TABLET | Freq: Two times a day (BID) | ORAL | Status: DC | PRN
  Administered 2016-09-08: 400 mg via ORAL
  Filled 2016-09-07: qty 2

## 2016-09-07 MED ORDER — INSULIN DETEMIR 100 UNIT/ML ~~LOC~~ SOLN
10.0000 [IU] | Freq: Two times a day (BID) | SUBCUTANEOUS | Status: DC
  Administered 2016-09-07 – 2016-09-08 (×3): 10 [IU] via SUBCUTANEOUS
  Filled 2016-09-07 (×4): qty 0.1

## 2016-09-07 MED ORDER — SODIUM BICARBONATE 8.4 % IV SOLN
INTRAVENOUS | Status: DC
  Administered 2016-09-07: 03:00:00 via INTRAVENOUS
  Filled 2016-09-07: qty 150

## 2016-09-07 NOTE — H&P (Signed)
History and Physical    Jaquavion Mccannon ZOX:[id number] DOB: [birth year] DOA: 09/06/2016  Referring MD/NP/PA:   PCP: Clayborn Heron, MD   Patient coming from:  The patient is coming from home.  At baseline, pt is independent for most of ADL.   Chief Complaint: Nausea, vomiting, abdominal pain  HPI: Hailey Miles is a 41 y.o. male with medical history significant of hypertension, diabetes mellitus, ischemia hepatitis, alcohol abuse, tobacco abuse, sciatia, rhabdomyolysis, who presents with nausea, vomiting and abdominal pain.  Patient states that his symptoms started yesterday morning. He vomited "80 times" without blood in the vomitus. His abdominal pain is located in the epigastric area, constant, 9 out of 10 in severity, sharp, nonradiating. It is not aggravated or alleviated by any factors. No diarrhea. Patient does not have fever, but has chills and feeling cold. No symptoms of UTI. Denies chest pain. He has mild cough and mild SOB which he attributes to smoking. Denies symptoms of UTI or unilateral weakness.  ED Course: pt was found to have WBC 11.7, negative troponin, negative urinalysis, lipase 19, potassium 3.3, creatinine normal, negative chest x-ray. Negative CT-chest. CT-abdomen/pelvis showed collecting system calculi measuring up to 3.5 mm, nonobstructing, small hiatal hernia and hepatic steatosis. Pt has prolonged QTc interval 642, tachycardia, O2 sats 98% on room air, temperature normal.  Review of Systems:   General: no fevers, has chills, no changes in body weight, has poor appetite, has fatigue HEENT: no blurry vision, hearing changes or sore throat Respiratory: no dyspnea, coughing, wheezing CV: no chest pain, no palpitations GI: has nausea, vomiting, abdominal pain, no diarrhea, constipation GU: no dysuria, burning on urination, increased urinary frequency, hematuria  Ext: no leg edema Neuro: no unilateral weakness, numbness, or tingling, no vision change or hearing  loss Skin: no rash, no skin tear. MSK: No muscle spasm, no deformity, no limitation of range of movement in spin Heme: No easy bruising.  Travel history: No recent long distant travel.  Allergy: No Known Allergies  Past Medical History:  Diagnosis Date  . Acute respiratory failure (HCC) 09/2014  . Amnesia   . Aspiration pneumonia (HCC) 09/2014  . Chronic back pain   . Diabetes mellitus without complication (HCC)   . Encephalopathy 09/2014  . ETOH abuse   . Hypertension   . Ischemic hepatitis unknown  . Rhabdomyolysis unknown  . Sciatica     Past Surgical History:  Procedure Laterality Date  . APPENDECTOMY    . BACK SURGERY  2002   Lumbar    Social History:  reports that he has been smoking.  He has a 9.00 pack-year smoking history. He has never used smokeless tobacco. He reports that he drinks alcohol. He reports that he does not use drugs.  Family History:  Family History  Problem Relation Age of Onset  . Cancer Mother     cause of death, ovarian & colon  . Diabetes Father   . Hypertension Father   . Gout Father   . Hyperlipidemia Father      Prior to Admission medications   Medication Sig Start Date End Date Taking? Authorizing Provider  calcium carbonate (TUMS - DOSED IN MG ELEMENTAL CALCIUM) 500 MG chewable tablet Chew 2 tablets by mouth 2 (two) times daily as needed for indigestion or heartburn.   Yes Historical Provider, MD  insulin aspart (NOVOLOG) 100 UNIT/ML injection Inject 5-20 Units into the skin 3 (three) times daily before meals. Sliding scale   Yes Historical Provider, MD  insulin  detemir (LEVEMIR) 100 UNIT/ML injection Inject 0.35 mLs (35 Units total) into the skin 2 (two) times daily. Patient taking differently: Inject 5-15 Units into the skin 2 (two) times daily as needed (when not using novolog).  10/13/14  Yes Jaclyn ShaggyEnobong Amao, MD  LORazepam (ATIVAN) 1 MG tablet Take one (1) tab by mouth every six (6) hours for 1 day, then every eight (8) hours for one  day, then every twelve (12) hours, then every twenty-four (24) hours. Patient not taking: Reported on 09/06/2016 05/01/16   Preston FleetingAnna Schubert, MD  metoCLOPramide (REGLAN) 10 MG tablet Take 1 tablet (10 mg total) by mouth every 6 (six) hours as needed for nausea. Patient not taking: Reported on 09/06/2016 05/01/16   Preston FleetingAnna Schubert, MD    Physical Exam: Vitals:   09/06/16 1832 09/07/16 0101  BP: (!) 171/125 141/88  Pulse: (!) 123 119  Resp: 18 17  Temp: 97.5 F (36.4 C) 98.2 F (36.8 C)  TempSrc: Oral Oral  SpO2: 100% 98%   General: Not in acute distress HEENT:       Eyes: PERRL, EOMI, no scleral icterus.       ENT: No discharge from the ears and nose, no pharynx injection, no tonsillar enlargement.        Neck: No JVD, no bruit, no mass felt. Heme: No neck lymph node enlargement. Cardiac: S1/S2, RRR, No murmurs, No gallops or rubs. Respiratory: No rales, wheezing, rhonchi or rubs. GI: Soft, nondistended, has tenderness in epigastric area, no rebound pain, no organomegaly, BS present. GU: No hematuria Ext: No pitting leg edema bilaterally. 2+DP/PT pulse bilaterally. Musculoskeletal: No joint deformities, No joint redness or warmth, no limitation of ROM in spin. Skin: No rashes.  Neuro: Alert, oriented X3, cranial nerves II-XII grossly intact, moves all extremities normally.  Psych: Patient is not psychotic, no suicidal or hemocidal ideation.   Labs on Admission: I have personally reviewed following labs and imaging studies  CBC:  Recent Labs Lab 09/06/16 1902  WBC 11.7*  HGB 17.6*  HCT 50.1  MCV 84.2  PLT 190   Basic Metabolic Panel:  Recent Labs Lab 09/06/16 1902 09/07/16 0049  NA 135  --   K 3.3*  --   CL 100*  --   CO2 24  --   GLUCOSE 234*  --   BUN 8  --   CREATININE 0.67  --   CALCIUM 9.6  --   MG  --  1.3*   GFR: CrCl cannot be calculated (Unknown ideal weight.). Liver Function Tests:  Recent Labs Lab 09/06/16 1902  AST 29  ALT 30  ALKPHOS 81   BILITOT 1.3*  PROT 7.5  ALBUMIN 4.1    Recent Labs Lab 09/06/16 1902  LIPASE 19   No results for input(s): AMMONIA in the last 168 hours. Coagulation Profile: No results for input(s): INR, PROTIME in the last 168 hours. Cardiac Enzymes: No results for input(s): CKTOTAL, CKMB, CKMBINDEX, TROPONINI in the last 168 hours. BNP (last 3 results) No results for input(s): PROBNP in the last 8760 hours. HbA1C: No results for input(s): HGBA1C in the last 72 hours. CBG:  Recent Labs Lab 09/06/16 1909  GLUCAP 237*   Lipid Profile: No results for input(s): CHOL, HDL, LDLCALC, TRIG, CHOLHDL, LDLDIRECT in the last 72 hours. Thyroid Function Tests: No results for input(s): TSH, T4TOTAL, FREET4, T3FREE, THYROIDAB in the last 72 hours. Anemia Panel: No results for input(s): VITAMINB12, FOLATE, FERRITIN, TIBC, IRON, RETICCTPCT in the last 72 hours.  Urine analysis:    Component Value Date/Time   COLORURINE AMBER (A) 09/06/2016 2113   APPEARANCEUR HAZY (A) 09/06/2016 2113   LABSPEC 1.030 09/06/2016 2113   PHURINE 7.0 09/06/2016 2113   GLUCOSEU 150 (A) 09/06/2016 2113   HGBUR NEGATIVE 09/06/2016 2113   BILIRUBINUR SMALL (A) 09/06/2016 2113   KETONESUR 20 (A) 09/06/2016 2113   PROTEINUR 100 (A) 09/06/2016 2113   NITRITE NEGATIVE 09/06/2016 2113   LEUKOCYTESUR NEGATIVE 09/06/2016 2113   Sepsis Labs: @LABRCNTIP (procalcitonin:4,lacticidven:4) )No results found for this or any previous visit (from the past 240 hour(s)).   Radiological Exams on Admission: Dg Chest 2 View  Result Date: 09/06/2016 CLINICAL DATA:  Emesis, chest and upper abdominal pain since this morning. EXAM: CHEST  2 VIEW COMPARISON:  None. FINDINGS: The heart size and mediastinal contours are within normal limits. Both lungs are clear. The visualized skeletal structures are unremarkable. IMPRESSION: No active cardiopulmonary disease. Electronically Signed   By: Tollie Eth M.D.   On: 09/06/2016 23:56   Ct Chest W  Contrast  Result Date: 09/07/2016 CLINICAL DATA:  Abdominal pain and emesis today. EXAM: CT CHEST, ABDOMEN, AND PELVIS WITH CONTRAST TECHNIQUE: Multidetector CT imaging of the chest, abdomen and pelvis was performed following the standard protocol during bolus administration of intravenous contrast. CONTRAST:  ISOVUE-300 IOPAMIDOL (ISOVUE-300) INJECTION 61% COMPARISON:  04/28/2016 FINDINGS: CT CHEST FINDINGS Cardiovascular: No significant vascular findings. Normal heart size. No pericardial effusion. The thoracic aorta is normal in caliber and intact without significant atherosclerotic changes. Mediastinum/Nodes: No enlarged mediastinal, hilar, or axillary lymph nodes. Thyroid gland, trachea, and esophagus demonstrate no significant findings. Lungs/Pleura: Lungs are clear. No pleural effusion or pneumothorax. No pneumomediastinum. Musculoskeletal: No significant skeletal abnormality. CT ABDOMEN PELVIS FINDINGS Hepatobiliary: Mild fatty infiltration of the liver without focal lesion. Gallbladder and bile ducts are unremarkable. Pancreas: Unremarkable. No pancreatic ductal dilatation or surrounding inflammatory changes. Spleen: Normal in size without focal abnormality. Adrenals/Urinary Tract: Collecting system calculi measuring up to 3.5 mm, nonobstructing. No significant renal parenchymal lesions. Ureters and urinary bladder are unremarkable. Both adrenals are normal. Stomach/Bowel: Hiatal hernia. Otherwise normal appearance of the stomach, small bowel and colon. Probable appendectomy. Vascular/Lymphatic: No significant vascular findings are present. No enlarged abdominal or pelvic lymph nodes. Reproductive: Unremarkable Other: No focal inflammation.  No ascites. Musculoskeletal: No significant skeletal lesion. IMPRESSION: 1. Hepatic steatosis. 2. Small hiatal hernia. 3. Nephrolithiasis. Electronically Signed   By: Ellery Plunk M.D.   On: 09/07/2016 00:29   Ct Abdomen Pelvis W Contrast  Result Date:  09/07/2016 CLINICAL DATA:  Abdominal pain and emesis today. EXAM: CT CHEST, ABDOMEN, AND PELVIS WITH CONTRAST TECHNIQUE: Multidetector CT imaging of the chest, abdomen and pelvis was performed following the standard protocol during bolus administration of intravenous contrast. CONTRAST:  ISOVUE-300 IOPAMIDOL (ISOVUE-300) INJECTION 61% COMPARISON:  04/28/2016 FINDINGS: CT CHEST FINDINGS Cardiovascular: No significant vascular findings. Normal heart size. No pericardial effusion. The thoracic aorta is normal in caliber and intact without significant atherosclerotic changes. Mediastinum/Nodes: No enlarged mediastinal, hilar, or axillary lymph nodes. Thyroid gland, trachea, and esophagus demonstrate no significant findings. Lungs/Pleura: Lungs are clear. No pleural effusion or pneumothorax. No pneumomediastinum. Musculoskeletal: No significant skeletal abnormality. CT ABDOMEN PELVIS FINDINGS Hepatobiliary: Mild fatty infiltration of the liver without focal lesion. Gallbladder and bile ducts are unremarkable. Pancreas: Unremarkable. No pancreatic ductal dilatation or surrounding inflammatory changes. Spleen: Normal in size without focal abnormality. Adrenals/Urinary Tract: Collecting system calculi measuring up to 3.5 mm, nonobstructing. No significant renal parenchymal  lesions. Ureters and urinary bladder are unremarkable. Both adrenals are normal. Stomach/Bowel: Hiatal hernia. Otherwise normal appearance of the stomach, small bowel and colon. Probable appendectomy. Vascular/Lymphatic: No significant vascular findings are present. No enlarged abdominal or pelvic lymph nodes. Reproductive: Unremarkable Other: No focal inflammation.  No ascites. Musculoskeletal: No significant skeletal lesion. IMPRESSION: 1. Hepatic steatosis. 2. Small hiatal hernia. 3. Nephrolithiasis. Electronically Signed   By: Ellery Plunk M.D.   On: 09/07/2016 00:29     EKG: Independently reviewed.  Sinus rhythm, QTC 642, nonspecific  T-wave change.  Assessment/Plan Principal Problem:   Abdominal pain Active Problems:   Hypertension   Diabetes mellitus without complication (HCC)   Prolonged Q-T interval on ECG   ETOH abuse   Tobacco abuse   Nausea with vomiting   Hypokalemia   Abdominal pain, nausea vomiting: Etiology is not clear. Lipase normal. CT abdomen/pelvis showed nonobstructive kidney stone, which does not explain his epigastric abdominal pain. Likely explanation is alcoholic gastritis. Since patient has prolonged QTC, will not start Protonix or Pepcid side.  -willl place on tele bed for obs -start sucrafate -porn morphine for pain -IVF: 3L ns given  Prolonged Q-T interval on ECG: QTc 642. -IV sodium bicarbonate was started by EDP, will continue -1 g of magnesium sulfate was given -Correction of hypokalemia -avoid medications which prolonge QT interval (pt received Reglan and Zofran in ED)  DM-II: Last A1c 11.0 on 04/28/16, poorly controled. Patient is taking Levemir and NovoLog at home -Levemir 10 units twice a day -SSI  HTN: not taking meds at home -Hydralazine IV when necessary  Hypokalemia: K=3.3 on admission. - Repleted - Check Mg level  Tobacco abuse and Alcohol abuse: -Did counseling about importance of quitting smoking -Nicotine patch -Did counseling about the importance of quitting drinking -CIWA protocol   DVT ppx: SQ Lovenox Code Status: Full code Family Communication: None at bed side. Disposition Plan:  Anticipate discharge back to previous home environment Consults called:  none Admission status: Obs / tele   Date of Service 09/07/2016    Lorretta Harp [facility] Pager [phone]  If 7PM-7AM, please contact night-coverage [email or web address] Password [facility] 09/07/2016, 6:25 AM

## 2016-09-07 NOTE — Progress Notes (Signed)
Patient seen and examined. Admitted after midnight secondary to abd pain, nausea and vomiting. Patient with hx of alcohol abuse and concerns for alcoholic gastritis. Stable on my exam; even still have some discomfort in epigastric area; is asking for something to drink and denies nausea/vomiting at this time. Unable to give PPI or H2 blocker due to prolonged QT. Please refer to H&P written by Dr. Clyde LundborgNiu for further info/details on admission.  Plan: -slowly advance diet -continue carafate -follow for risk of alcohol withdrawal -replete electrolytes -follow EKG  Vassie LollMadera, Dwanda Tufano [phone]

## 2016-09-08 DIAGNOSIS — E876 Hypokalemia: Secondary | ICD-10-CM

## 2016-09-08 DIAGNOSIS — R9431 Abnormal electrocardiogram [ECG] [EKG]: Secondary | ICD-10-CM

## 2016-09-08 DIAGNOSIS — I10 Essential (primary) hypertension: Secondary | ICD-10-CM

## 2016-09-08 DIAGNOSIS — R112 Nausea with vomiting, unspecified: Secondary | ICD-10-CM

## 2016-09-08 DIAGNOSIS — Z720 Tobacco use: Secondary | ICD-10-CM

## 2016-09-08 DIAGNOSIS — E119 Type 2 diabetes mellitus without complications: Secondary | ICD-10-CM

## 2016-09-08 LAB — BASIC METABOLIC PANEL
ANION GAP: 5 (ref 5–15)
BUN: <5 mg/dL — ABNORMAL LOW (ref 6–20)
CO2: 28 mmol/L (ref 22–32)
Calcium: 8.3 mg/dL — ABNORMAL LOW (ref 8.9–10.3)
Chloride: 102 mmol/L (ref 101–111)
Creatinine, Ser: 0.52 mg/dL — ABNORMAL LOW (ref 0.61–1.24)
GFR calc Af Amer: >60 mL/min (ref >60)
GLUCOSE: 128 mg/dL — AB (ref 65–99)
POTASSIUM: 3.1 mmol/L — AB (ref 3.5–5.1)
Sodium: 135 mmol/L (ref 135–145)

## 2016-09-08 LAB — GLUCOSE, CAPILLARY
GLUCOSE-CAPILLARY: 191 mg/dL — AB (ref 65–99)
GLUCOSE-CAPILLARY: 247 mg/dL — AB (ref 65–99)
GLUCOSE-CAPILLARY: 197 mg/dL — AB (ref 65–99)
Glucose-Capillary: 291 mg/dL — ABNORMAL HIGH (ref 65–99)
Glucose-Capillary: 174 mg/dL — ABNORMAL HIGH (ref 65–99)

## 2016-09-08 LAB — HIV ANTIBODY (ROUTINE TESTING W REFLEX): HIV SCREEN 4TH GENERATION: NONREACTIVE

## 2016-09-08 LAB — MAGNESIUM: MAGNESIUM: 1.7 mg/dL (ref 1.7–2.4)

## 2016-09-08 MED ORDER — POTASSIUM CHLORIDE CRYS ER 20 MEQ PO TBCR
40.0000 meq | EXTENDED_RELEASE_TABLET | Freq: Every day | ORAL | Status: DC
  Administered 2016-09-08 – 2016-09-09 (×2): 40 meq via ORAL
  Filled 2016-09-08 (×2): qty 2

## 2016-09-08 MED ORDER — PROMETHAZINE HCL 25 MG/ML IJ SOLN
12.5000 mg | Freq: Four times a day (QID) | INTRAMUSCULAR | Status: DC | PRN
  Administered 2016-09-08: 12.5 mg via INTRAVENOUS
  Filled 2016-09-08: qty 1

## 2016-09-08 MED ORDER — PANTOPRAZOLE SODIUM 40 MG PO TBEC
40.0000 mg | DELAYED_RELEASE_TABLET | Freq: Every day | ORAL | Status: DC
  Administered 2016-09-08 – 2016-09-09 (×2): 40 mg via ORAL
  Filled 2016-09-08 (×2): qty 1

## 2016-09-08 MED ORDER — INSULIN DETEMIR 100 UNIT/ML ~~LOC~~ SOLN
15.0000 [IU] | Freq: Two times a day (BID) | SUBCUTANEOUS | Status: DC
  Administered 2016-09-08 – 2016-09-09 (×2): 15 [IU] via SUBCUTANEOUS
  Filled 2016-09-08 (×3): qty 0.15

## 2016-09-08 MED ORDER — PROCHLORPERAZINE EDISYLATE 5 MG/ML IJ SOLN
10.0000 mg | Freq: Four times a day (QID) | INTRAMUSCULAR | Status: DC | PRN
  Administered 2016-09-08 (×2): 10 mg via INTRAVENOUS
  Filled 2016-09-08 (×2): qty 2

## 2016-09-08 MED ORDER — SODIUM CHLORIDE 0.9 % IV SOLN: INTRAVENOUS | Status: DC

## 2016-09-08 NOTE — Progress Notes (Signed)
[facility] PROGRESS NOTE  Chris Smith ZOX:[id number] DOB: 06/28/1976 DOA: 09/06/2016 PCP: Clayborn HeronVictoria R Rankins, MD  Interim summary and HPI 41 y.o. male with medical history significant of hypertension, diabetes mellitus, ischemia hepatitis, alcohol abuse, tobacco abuse, sciatia, rhabdomyolysis, who presents with nausea, vomiting and abdominal pain. Patient states that his symptoms started yesterday morning. He vomited "80 times" without blood in the vomitus. His abdominal pain is located in the epigastric area, constant, 9 out of 10 in severity, sharp, nonradiating. It is not aggravated or alleviated by any factors. No diarrhea. Patient does not have fever, but has chills and feeling cold. No symptoms of UTI. Denies chest pain.   Assessment/Plan: Abdominal pain, nausea vomiting: Etiology is not clear. Lipase normal. CT abdomen/pelvis showed nonobstructive kidney stone, which does not explain his epigastric abdominal pain. Likely explanation is alcoholic gastritis. Vs gastroparesis. -will continue PRN antiemetics -will start PPI -continue carafate  -advance diet as tolerated -patient improving   Prolonged Q-T interval on ECG: QTc 642. -IV sodium bicarbonate was started by EDP and continue for 24 hours. -electrolytes corrected -medications which prolonge QT interval avoided -today QT 441 -will monitor  DM-II: Last A1c 11.0 on 04/28/16, poorly controled. Patient is taking Levemir and NovoLog at home -Levemir 15 units twice a day; CBG's rising -continue SSI -will advance diet   HTN: not taking meds at home -Hydralazine IV PRN -might benefit of low dose ACE/ARB; to maintain BP at goal and for renal protection   Hypokalemia:  -K=3.1  -will replete and follow trend -Mg WNL -most likely due to alcohol consumption and GI loses  Tobacco abuse and Alcohol abuse: -cessation counseling provided -Nicotine patch -CIWA protocol ordered -no signs of withdrawal.  Code Status: Full  Code Family Communication: no family at bedside today; but dad updated on 3/8 Disposition Plan: will keep inpatient, continue electrolytes repletion, continue carafate and start Protonix. QT resolved now. Will advance diet and continue supportive care.   Consultants:  None   Procedures:  See below for x-ray reports   Antibiotics:  None   HPI/Subjective: Afebrile, denies CP; still with some nausea; but endorses no vomiting. Patient will like to have diet advanced.  Objective: Vitals:   09/07/16 2025 09/08/16 0621  BP: (!) 149/92 (!) 138/100  Pulse: 96 72  Resp: 19 18  Temp: 97.8 F (36.6 C) 98 F (36.7 C)    Intake/Output Summary (Last 24 hours) at 09/08/16 1438 Last data filed at 09/08/16 0753  Gross per 24 hour  Intake          2993.33 ml  Output             1375 ml  Net          1618.33 ml   There were no vitals filed for this visit.  Exam:   General:  Afebrile, no CP, no SOB. Patient with intermittent nausea, but not vomiting. Endorses epigastric pain is better.  Cardiovascular: S1, S2, no rubs, no gallops; no JVD  Respiratory: CTA bilaterally   Abdomen: soft, NT, ND, positive BS  Musculoskeletal: no edema, no cyanosis   Data Reviewed: Basic Metabolic Panel:  Recent Labs Lab 09/06/16 1902 09/07/16 0049 09/07/16 0701 09/08/16 0539  NA 135  --  139 135  K 3.3*  --  3.1* 3.1*  CL 100*  --  104 102  CO2 24  --  27 28  GLUCOSE 234*  --  139* 128*  BUN 8  --  7 <5*  CREATININE 0.67  --  0.52* 0.52*  CALCIUM 9.6  --  8.5* 8.3*  MG  --  1.3*  --  1.7   Liver Function Tests:  Recent Labs Lab 09/06/16 1902  AST 29  ALT 30  ALKPHOS 81  BILITOT 1.3*  PROT 7.5  ALBUMIN 4.1    Recent Labs Lab 09/06/16 1902  LIPASE 19   CBC:  Recent Labs Lab 09/06/16 1902 09/07/16 0701  WBC 11.7* 12.3*  HGB 17.6* 15.6  HCT 50.1 46.0  MCV 84.2 84.9  PLT 190 179   CBG:  Recent Labs Lab 09/07/16 1223 09/07/16 1654 09/07/16 2027  09/08/16 0751 09/08/16 1154  GLUCAP 87 178* 291* 174* 191*    Studies: Dg Chest 2 View  Result Date: 09/06/2016 CLINICAL DATA:  Emesis, chest and upper abdominal pain since this morning. EXAM: CHEST  2 VIEW COMPARISON:  None. FINDINGS: The heart size and mediastinal contours are within normal limits. Both lungs are clear. The visualized skeletal structures are unremarkable. IMPRESSION: No active cardiopulmonary disease. Electronically Signed   By: Tollie Eth M.D.   On: 09/06/2016 23:56   Ct Chest W Contrast  Result Date: 09/07/2016 CLINICAL DATA:  Abdominal pain and emesis today. EXAM: CT CHEST, ABDOMEN, AND PELVIS WITH CONTRAST TECHNIQUE: Multidetector CT imaging of the chest, abdomen and pelvis was performed following the standard protocol during bolus administration of intravenous contrast. CONTRAST:  ISOVUE-300 IOPAMIDOL (ISOVUE-300) INJECTION 61% COMPARISON:  04/28/2016 FINDINGS: CT CHEST FINDINGS Cardiovascular: No significant vascular findings. Normal heart size. No pericardial effusion. The thoracic aorta is normal in caliber and intact without significant atherosclerotic changes. Mediastinum/Nodes: No enlarged mediastinal, hilar, or axillary lymph nodes. Thyroid gland, trachea, and esophagus demonstrate no significant findings. Lungs/Pleura: Lungs are clear. No pleural effusion or pneumothorax. No pneumomediastinum. Musculoskeletal: No significant skeletal abnormality. CT ABDOMEN PELVIS FINDINGS Hepatobiliary: Mild fatty infiltration of the liver without focal lesion. Gallbladder and bile ducts are unremarkable. Pancreas: Unremarkable. No pancreatic ductal dilatation or surrounding inflammatory changes. Spleen: Normal in size without focal abnormality. Adrenals/Urinary Tract: Collecting system calculi measuring up to 3.5 mm, nonobstructing. No significant renal parenchymal lesions. Ureters and urinary bladder are unremarkable. Both adrenals are normal. Stomach/Bowel: Hiatal hernia.  Otherwise normal appearance of the stomach, small bowel and colon. Probable appendectomy. Vascular/Lymphatic: No significant vascular findings are present. No enlarged abdominal or pelvic lymph nodes. Reproductive: Unremarkable Other: No focal inflammation.  No ascites. Musculoskeletal: No significant skeletal lesion. IMPRESSION: 1. Hepatic steatosis. 2. Small hiatal hernia. 3. Nephrolithiasis. Electronically Signed   By: Ellery Plunk M.D.   On: 09/07/2016 00:29   Ct Abdomen Pelvis W Contrast  Result Date: 09/07/2016 CLINICAL DATA:  Abdominal pain and emesis today. EXAM: CT CHEST, ABDOMEN, AND PELVIS WITH CONTRAST TECHNIQUE: Multidetector CT imaging of the chest, abdomen and pelvis was performed following the standard protocol during bolus administration of intravenous contrast. CONTRAST:  ISOVUE-300 IOPAMIDOL (ISOVUE-300) INJECTION 61% COMPARISON:  04/28/2016 FINDINGS: CT CHEST FINDINGS Cardiovascular: No significant vascular findings. Normal heart size. No pericardial effusion. The thoracic aorta is normal in caliber and intact without significant atherosclerotic changes. Mediastinum/Nodes: No enlarged mediastinal, hilar, or axillary lymph nodes. Thyroid gland, trachea, and esophagus demonstrate no significant findings. Lungs/Pleura: Lungs are clear. No pleural effusion or pneumothorax. No pneumomediastinum. Musculoskeletal: No significant skeletal abnormality. CT ABDOMEN PELVIS FINDINGS Hepatobiliary: Mild fatty infiltration of the liver without focal lesion. Gallbladder and bile ducts are unremarkable. Pancreas: Unremarkable. No pancreatic ductal dilatation or surrounding inflammatory changes. Spleen: Normal in  size without focal abnormality. Adrenals/Urinary Tract: Collecting system calculi measuring up to 3.5 mm, nonobstructing. No significant renal parenchymal lesions. Ureters and urinary bladder are unremarkable. Both adrenals are normal. Stomach/Bowel: Hiatal hernia. Otherwise normal  appearance of the stomach, small bowel and colon. Probable appendectomy. Vascular/Lymphatic: No significant vascular findings are present. No enlarged abdominal or pelvic lymph nodes. Reproductive: Unremarkable Other: No focal inflammation.  No ascites. Musculoskeletal: No significant skeletal lesion. IMPRESSION: 1. Hepatic steatosis. 2. Small hiatal hernia. 3. Nephrolithiasis. Electronically Signed   By: Ellery Plunk M.D.   On: 09/07/2016 00:29    Scheduled Meds: . enoxaparin (LOVENOX) injection  40 mg Subcutaneous Q24H  . insulin aspart  0-9 Units Subcutaneous TID WC  . insulin detemir  10 Units Subcutaneous BID  . LORazepam  1 mg Intravenous Once  . nicotine  21 mg Transdermal Daily  . pantoprazole  40 mg Oral Q1200  . potassium chloride  40 mEq Oral Once  . potassium chloride  40 mEq Oral Daily  . sodium chloride flush  3 mL Intravenous Q12H  . sucralfate  1 g Oral TID WC & HS   Continuous Infusions: .  sodium bicarbonate  infusion 1000 mL 125 mL/hr at 09/07/16 2056    Principal Problem:   Abdominal pain Active Problems:   Hypertension   Diabetes mellitus without complication (HCC)   Prolonged Q-T interval on ECG   ETOH abuse   Tobacco abuse   Nausea with vomiting   Hypokalemia    Time spent: 25 minutes    Vassie Loll  [facility] Pager [phone]. If 7PM-7AM, please contact night-coverage at [email or web address], password [facility] 09/08/2016, 2:38 PM  LOS: 1 day

## 2016-09-09 DIAGNOSIS — E860 Dehydration: Secondary | ICD-10-CM

## 2016-09-09 LAB — BASIC METABOLIC PANEL
ANION GAP: 5 (ref 5–15)
BUN: <5 mg/dL — ABNORMAL LOW (ref 6–20)
CALCIUM: 8.5 mg/dL — AB (ref 8.9–10.3)
CO2: 26 mmol/L (ref 22–32)
Chloride: 106 mmol/L (ref 101–111)
Creatinine, Ser: 0.54 mg/dL — ABNORMAL LOW (ref 0.61–1.24)
GLUCOSE: 178 mg/dL — AB (ref 65–99)
POTASSIUM: 3.2 mmol/L — AB (ref 3.5–5.1)
Sodium: 137 mmol/L (ref 135–145)

## 2016-09-09 LAB — CBC
HCT: 44.9 % (ref 39.0–52.0)
Hemoglobin: 14.8 g/dL (ref 13.0–17.0)
MCH: 28.4 pg (ref 26.0–34.0)
MCHC: 33 g/dL (ref 30.0–36.0)
MCV: 86.2 fL (ref 78.0–100.0)
PLATELETS: 158 10*3/uL (ref 150–400)
RBC: 5.21 MIL/uL (ref 4.22–5.81)
RDW: 15 % (ref 11.5–15.5)
WBC: 8.9 10*3/uL (ref 4.0–10.5)

## 2016-09-09 LAB — GLUCOSE, CAPILLARY
GLUCOSE-CAPILLARY: 167 mg/dL — AB (ref 65–99)
Glucose-Capillary: 241 mg/dL — ABNORMAL HIGH (ref 65–99)

## 2016-09-09 MED ORDER — NICOTINE 21 MG/24HR TD PT24: 21.0000 mg | MEDICATED_PATCH | Freq: Every day | TRANSDERMAL | 0 refills | Status: DC

## 2016-09-09 MED ORDER — ONE-A-DAY MENS PO TABS: 1.0000 | ORAL_TABLET | Freq: Every day | ORAL | 1 refills | Status: DC

## 2016-09-09 MED ORDER — SUCRALFATE 1 GM/10ML PO SUSP: 1.0000 g | Freq: Three times a day (TID) | ORAL | 0 refills | Status: DC

## 2016-09-09 MED ORDER — PROCHLORPERAZINE MALEATE 10 MG PO TABS: 10.0000 mg | ORAL_TABLET | Freq: Four times a day (QID) | ORAL | 0 refills | Status: DC | PRN

## 2016-09-09 MED ORDER — PANTOPRAZOLE SODIUM 40 MG PO TBEC: 40.0000 mg | DELAYED_RELEASE_TABLET | Freq: Every day | ORAL | 1 refills | Status: DC

## 2016-09-09 MED ORDER — POTASSIUM CHLORIDE CRYS ER 20 MEQ PO TBCR: 40.0000 meq | EXTENDED_RELEASE_TABLET | Freq: Every day | ORAL | 1 refills | Status: DC

## 2016-09-09 MED ORDER — ERYTHROMYCIN BASE 250 MG PO TABS: 250.0000 mg | ORAL_TABLET | Freq: Three times a day (TID) | ORAL | 0 refills | Status: DC | PRN

## 2016-09-09 NOTE — Discharge Summary (Signed)
Physician Discharge Summary  Chris Smith ZOX:[id number] DOB: [birth year] DOA: 09/06/2016  PCP: Clayborn Heron, MD  Admit date: 09/06/2016 Discharge date: 09/09/2016  Time spent: 35 minutes  Recommendations for Outpatient Follow-up:  1. Repeat BMET to follow electrolytes and renal function 2. Also reassess Mg level 3. Recheck BP and if appropriate start low dose ARB/ACE, which would help with renal protection from diabetes 4. Repeat EKG to follow QT  5. If symptoms persist, arrange outpatient referral with GI for EGD and further motility studies   Discharge Diagnoses:  Principal Problem:   Abdominal pain Active Problems:   Hypertension   Diabetes mellitus without complication (HCC)   Prolonged Q-T interval on ECG   ETOH abuse   Tobacco abuse   Nausea with vomiting   Hypokalemia   Dehydration   Discharge Condition: stable and improved. Discharge home with instructions to follow up with PCP in 10 days. Patient also advise to keep himself abstinent from alcohol.  Diet recommendation: heart healthy and modified carbohydrates diet  History of present illness:  41 y.o.malewith medical history significant of hypertension, diabetes mellitus, ischemia hepatitis, alcohol abuse, tobacco abuse, sciatia, rhabdomyolysis, who presents with nausea, vomiting and abdominal pain. Patient states that his symptoms started yesterday morning. He vomited "80 times" without blood in the vomitus. His abdominal pain is located in the epigastric area, constant, 9 out of 10 in severity, sharp, nonradiating. It is not aggravated or alleviated by any factors. No diarrhea. Patient does not have fever, but has chills and feeling cold. No symptoms of UTI. Denies chest pain.   Hospital Course:  Abdominal pain, nausea vomiting: Etiology is not clear. Lipase normal. CT abdomen/pelvis showed nonobstructive kidney stone, which does not explain his epigastric abdominal pain. Likely explanation is alcoholic gastritis.  Vs gastroparesis. -will continue PRN antiemetics -will start PPI daily -continue carafate  -advance diet as tolerated -patient improving -PRN erythromycin as prokinetic (less effect on QT, in comparison to reglan) -if symptoms persist will recommend GI referral for EGD and motility studies.    Prolonged Q-T interval on ECG: QTc 642. -corrected after 24 hours of IV sodium bicarbonate and electrolytes correction  -minimizing medicationswhich prolonged QT interval  -QT 441 at discharge -will recommend repeat EKG at follow up to monitor trend  DM-II:Last A1c 11.0 on 04/28/16, poorlycontroled. 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 -at discharge patient resume don home hypoglycemic regimen  HTN: not taking meds at home -might benefit of low dose ACE/ARB; to maintain BP at goal and for renal protection  To be reassess by PCP  Hypokalemia:  -most likely due to alcohol consumption and GI loses -K=3.2; further repleted prior to discharge and patient discharged on daily maintenance  -recommend BMET at follow up to reassess electrolytes trend -Mg WNL  Tobacco abuse and Alcohol abuse: -cessation counseling provided for alcohol and quitting tobacco -Nicotine patch prescribed at discharge -CIWA protocol used during hospitalization; no signs of withdrawal. -discharge on MV daily to supplement thiamine and folic acid  Procedures: See below for x-ray reports  Consultations:  None   Discharge Exam: Vitals:   09/09/16 0609 09/09/16 1337  BP: 130/85 (!) 153/94  Pulse: 99 84  Resp: 17 18  Temp: 98 F (36.7 C) 97.7 F (36.5 C)    General:  Afebrile, no CP, no SOB. Patient with intermittent nausea, but not further vomiting. Endorses epigastric pain is essentially resolved now. Tolerated full meals today.  Cardiovascular: S1, S2, no rubs, no gallops; no JVD  Respiratory: CTA  bilaterally   Abdomen: soft, NT, ND, positive BS  Musculoskeletal: no edema, no cyanosis     Discharge Instructions   Discharge Instructions    Diet Carb Modified    Complete by:  As directed    Discharge instructions    Complete by:  As directed    Arrange follow up with PCP in 10 days Please take medications as prescribed Stop alcohol use  Keep yourself well hydrated     Current Discharge Medication List    START taking these medications   Details  erythromycin (E-MYCIN) 250 MG tablet Take 1 tablet (250 mg total) by mouth every 8 (eight) hours as needed (nausea and vomiting). Qty: 45 tablet, Refills: 0    multivitamin (ONE-A-DAY MEN'S) TABS tablet Take 1 tablet by mouth daily. Qty: 30 tablet, Refills: 1    nicotine (NICODERM CQ - DOSED IN MG/24 HOURS) 21 mg/24hr patch Place 1 patch (21 mg total) onto the skin daily. Qty: 28 patch, Refills: 0    pantoprazole (PROTONIX) 40 MG tablet Take 1 tablet (40 mg total) by mouth daily at 12 noon. Qty: 30 tablet, Refills: 1    potassium chloride SA (K-DUR,KLOR-CON) 20 MEQ tablet Take 2 tablets (40 mEq total) by mouth daily. Qty: 60 tablet, Refills: 1    prochlorperazine (COMPAZINE) 10 MG tablet Take 1 tablet (10 mg total) by mouth every 6 (six) hours as needed for nausea or vomiting. Qty: 30 tablet, Refills: 0    sucralfate (CARAFATE) 1 GM/10ML suspension Take 10 mLs (1 g total) by mouth 4 (four) times daily -  with meals and at bedtime. Qty: 420 mL, Refills: 0      CONTINUE these medications which have NOT CHANGED   Details  calcium carbonate (TUMS - DOSED IN MG ELEMENTAL CALCIUM) 500 MG chewable tablet Chew 2 tablets by mouth 2 (two) times daily as needed for indigestion or heartburn.    insulin aspart (NOVOLOG) 100 UNIT/ML injection Inject 5-20 Units into the skin 3 (three) times daily before meals. Sliding scale    insulin detemir (LEVEMIR) 100 UNIT/ML injection Inject 0.35 mLs (35 Units total) into the skin 2 (two) times daily. Qty: 3 vial, Refills: 1      STOP taking these medications     LORazepam  (ATIVAN) 1 MG tablet      metoCLOPramide (REGLAN) 10 MG tablet        No Known Allergies Follow-up Information    Victoria R Rankins, MD. Schedule an appointment as soon as possClayborn Heronible for a visit in 10 day(s).   Specialty:  Family Medicine Contact information: [address] [place] [address] [phone]           The results of significant diagnostics from this hospitalization (including imaging, microbiology, ancillary and laboratory) are listed below for reference.    Significant Diagnostic Studies: Dg Chest 2 View  Result Date: 09/06/2016 CLINICAL DATA:  Emesis, chest and upper abdominal pain since this morning. EXAM: CHEST  2 VIEW COMPARISON:  None. FINDINGS: The heart size and mediastinal contours are within normal limits. Both lungs are clear. The visualized skeletal structures are unremarkable. IMPRESSION: No active cardiopulmonary disease. Electronically Signed   By: Tollie Ethavid  Kwon M.D.   On: 09/06/2016 23:56   Ct Chest W Contrast  Result Date: 09/07/2016 CLINICAL DATA:  Abdominal pain and emesis today. EXAM: CT CHEST, ABDOMEN, AND PELVIS WITH CONTRAST TECHNIQUE: Multidetector CT imaging of the chest, abdomen and pelvis was performed following the standard protocol during bolus  administration of intravenous contrast. CONTRAST:  ISOVUE-300 IOPAMIDOL (ISOVUE-300) INJECTION 61% COMPARISON:  04/28/2016 FINDINGS: CT CHEST FINDINGS Cardiovascular: No significant vascular findings. Normal heart size. No pericardial effusion. The thoracic aorta is normal in caliber and intact without significant atherosclerotic changes. Mediastinum/Nodes: No enlarged mediastinal, hilar, or axillary lymph nodes. Thyroid gland, trachea, and esophagus demonstrate no significant findings. Lungs/Pleura: Lungs are clear. No pleural effusion or pneumothorax. No pneumomediastinum. Musculoskeletal: No significant skeletal abnormality. CT ABDOMEN PELVIS FINDINGS Hepatobiliary: Mild fatty infiltration of  the liver without focal lesion. Gallbladder and bile ducts are unremarkable. Pancreas: Unremarkable. No pancreatic ductal dilatation or surrounding inflammatory changes. Spleen: Normal in size without focal abnormality. Adrenals/Urinary Tract: Collecting system calculi measuring up to 3.5 mm, nonobstructing. No significant renal parenchymal lesions. Ureters and urinary bladder are unremarkable. Both adrenals are normal. Stomach/Bowel: Hiatal hernia. Otherwise normal appearance of the stomach, small bowel and colon. Probable appendectomy. Vascular/Lymphatic: No significant vascular findings are present. No enlarged abdominal or pelvic lymph nodes. Reproductive: Unremarkable Other: No focal inflammation.  No ascites. Musculoskeletal: No significant skeletal lesion. IMPRESSION: 1. Hepatic steatosis. 2. Small hiatal hernia. 3. Nephrolithiasis. Electronically Signed   By: Ellery Plunk M.D.   On: 09/07/2016 00:29   Ct Abdomen Pelvis W Contrast  Result Date: 09/07/2016 CLINICAL DATA:  Abdominal pain and emesis today. EXAM: CT CHEST, ABDOMEN, AND PELVIS WITH CONTRAST TECHNIQUE: Multidetector CT imaging of the chest, abdomen and pelvis was performed following the standard protocol during bolus administration of intravenous contrast. CONTRAST:  ISOVUE-300 IOPAMIDOL (ISOVUE-300) INJECTION 61% COMPARISON:  04/28/2016 FINDINGS: CT CHEST FINDINGS Cardiovascular: No significant vascular findings. Normal heart size. No pericardial effusion. The thoracic aorta is normal in caliber and intact without significant atherosclerotic changes. Mediastinum/Nodes: No enlarged mediastinal, hilar, or axillary lymph nodes. Thyroid gland, trachea, and esophagus demonstrate no significant findings. Lungs/Pleura: Lungs are clear. No pleural effusion or pneumothorax. No pneumomediastinum. Musculoskeletal: No significant skeletal abnormality. CT ABDOMEN PELVIS FINDINGS Hepatobiliary: Mild fatty infiltration of the liver without focal  lesion. Gallbladder and bile ducts are unremarkable. Pancreas: Unremarkable. No pancreatic ductal dilatation or surrounding inflammatory changes. Spleen: Normal in size without focal abnormality. Adrenals/Urinary Tract: Collecting system calculi measuring up to 3.5 mm, nonobstructing. No significant renal parenchymal lesions. Ureters and urinary bladder are unremarkable. Both adrenals are normal. Stomach/Bowel: Hiatal hernia. Otherwise normal appearance of the stomach, small bowel and colon. Probable appendectomy. Vascular/Lymphatic: No significant vascular findings are present. No enlarged abdominal or pelvic lymph nodes. Reproductive: Unremarkable Other: No focal inflammation.  No ascites. Musculoskeletal: No significant skeletal lesion. IMPRESSION: 1. Hepatic steatosis. 2. Small hiatal hernia. 3. Nephrolithiasis. Electronically Signed   By: Ellery Plunk M.D.   On: 09/07/2016 00:29   Labs: Basic Metabolic Panel:  Recent Labs Lab 09/06/16 1902 09/07/16 0049 09/07/16 0701 09/08/16 0539 09/09/16 0659  NA 135  --  139 135 137  K 3.3*  --  3.1* 3.1* 3.2*  CL 100*  --  104 102 106  CO2 24  --  27 28 26   GLUCOSE 234*  --  139* 128* 178*  BUN 8  --  7 <5* <5*  CREATININE 0.67  --  0.52* 0.52* 0.54*  CALCIUM 9.6  --  8.5* 8.3* 8.5*  MG  --  1.3*  --  1.7  --    Liver Function Tests:  Recent Labs Lab 09/06/16 1902  AST 29  ALT 30  ALKPHOS 81  BILITOT 1.3*  PROT 7.5  ALBUMIN 4.1    Recent Labs  Lab 09/06/16 1902  LIPASE 19   CBC:  Recent Labs Lab 09/06/16 1902 09/07/16 0701 09/09/16 0659  WBC 11.7* 12.3* 8.9  HGB 17.6* 15.6 14.8  HCT 50.1 46.0 44.9  MCV 84.2 84.9 86.2  PLT 190 179 158   CBG:  Recent Labs Lab 09/08/16 1154 09/08/16 1637 09/08/16 2314 09/09/16 0807 09/09/16 1122  GLUCAP 191* 247* 197* 167* 241*    Signed:  Vassie Loll MD.  [facility] 09/09/2016, 4:52 PM

## 2016-09-09 NOTE — Progress Notes (Signed)
Pt discharged to home. DC instructions given. No concerns voiced. Prescriptions also given. Left unit ambulatorily . Refused wheelchair. Left in good condition. VWilliams,rn.

## 2017-02-08 ENCOUNTER — Emergency Department (HOSPITAL_COMMUNITY)
Admission: EM | Admit: 2017-02-08 | Discharge: 2017-02-08 | Disposition: A | Discharge status: Home / Self Care | Payer: Self-pay | Attending: Emergency Medicine | Admitting: Emergency Medicine

## 2017-02-08 ENCOUNTER — Encounter (HOSPITAL_COMMUNITY): Payer: Self-pay | Admitting: Emergency Medicine

## 2017-02-08 ENCOUNTER — Other Ambulatory Visit: Payer: Self-pay

## 2017-02-08 ENCOUNTER — Emergency Department (HOSPITAL_COMMUNITY): Payer: Self-pay

## 2017-02-08 DIAGNOSIS — F17200 Nicotine dependence, unspecified, uncomplicated: Secondary | ICD-10-CM | POA: Insufficient documentation

## 2017-02-08 DIAGNOSIS — I10 Essential (primary) hypertension: Secondary | ICD-10-CM | POA: Insufficient documentation

## 2017-02-08 DIAGNOSIS — R202 Paresthesia of skin: Secondary | ICD-10-CM | POA: Insufficient documentation

## 2017-02-08 DIAGNOSIS — Z794 Long term (current) use of insulin: Secondary | ICD-10-CM | POA: Insufficient documentation

## 2017-02-08 DIAGNOSIS — E876 Hypokalemia: Secondary | ICD-10-CM | POA: Insufficient documentation

## 2017-02-08 DIAGNOSIS — H538 Other visual disturbances: Secondary | ICD-10-CM | POA: Insufficient documentation

## 2017-02-08 DIAGNOSIS — E1165 Type 2 diabetes mellitus with hyperglycemia: Secondary | ICD-10-CM | POA: Insufficient documentation

## 2017-02-08 LAB — I-STAT TROPONIN, ED: Troponin i, poc: 0.01 ng/mL (ref 0.00–0.08)

## 2017-02-08 LAB — CBC WITH DIFFERENTIAL/PLATELET
BASOS ABS: 0 10*3/uL (ref 0.0–0.1)
BASOS PCT: 0 %
Eosinophils Absolute: 0 10*3/uL (ref 0.0–0.7)
Eosinophils Relative: 1 %
HEMATOCRIT: 44.5 % (ref 39.0–52.0)
Hemoglobin: 15.6 g/dL (ref 13.0–17.0)
Lymphocytes Relative: 53 %
Lymphs Abs: 2.9 10*3/uL (ref 0.7–4.0)
MCH: 29.3 pg (ref 26.0–34.0)
MCHC: 35.1 g/dL (ref 30.0–36.0)
MCV: 83.5 fL (ref 78.0–100.0)
MONOS PCT: 9 %
Monocytes Absolute: 0.5 10*3/uL (ref 0.1–1.0)
NEUTROS ABS: 2 10*3/uL (ref 1.7–7.7)
NEUTROS PCT: 37 %
PLATELETS: 124 10*3/uL — AB (ref 150–400)
RBC: 5.33 MIL/uL (ref 4.22–5.81)
RDW: 15.8 % — ABNORMAL HIGH (ref 11.5–15.5)
WBC: 5.4 10*3/uL (ref 4.0–10.5)

## 2017-02-08 LAB — COMPREHENSIVE METABOLIC PANEL
ALK PHOS: 64 U/L (ref 38–126)
ALT: 71 U/L — ABNORMAL HIGH (ref 17–63)
AST: 95 U/L — AB (ref 15–41)
Albumin: 4.2 g/dL (ref 3.5–5.0)
Anion gap: 12 (ref 5–15)
BILIRUBIN TOTAL: 1 mg/dL (ref 0.3–1.2)
CALCIUM: 9.1 mg/dL (ref 8.9–10.3)
CHLORIDE: 101 mmol/L (ref 101–111)
CO2: 26 mmol/L (ref 22–32)
CREATININE: 0.61 mg/dL (ref 0.61–1.24)
Glucose, Bld: 186 mg/dL — ABNORMAL HIGH (ref 65–99)
Potassium: 3.3 mmol/L — ABNORMAL LOW (ref 3.5–5.1)
Sodium: 139 mmol/L (ref 135–145)
Total Protein: 7.4 g/dL (ref 6.5–8.1)

## 2017-02-08 LAB — CBG MONITORING, ED: GLUCOSE-CAPILLARY: 238 mg/dL — AB (ref 65–99)

## 2017-02-08 MED ORDER — KETOROLAC TROMETHAMINE 30 MG/ML IJ SOLN
30.0000 mg | Freq: Once | INTRAMUSCULAR | Status: AC
  Administered 2017-02-08: 30 mg via INTRAVENOUS
  Filled 2017-02-08: qty 1

## 2017-02-08 MED ORDER — POTASSIUM CHLORIDE CRYS ER 20 MEQ PO TBCR
40.0000 meq | EXTENDED_RELEASE_TABLET | Freq: Once | ORAL | Status: AC
  Administered 2017-02-08: 40 meq via ORAL
  Filled 2017-02-08: qty 2

## 2017-02-08 MED ORDER — SODIUM CHLORIDE 0.9 % IV BOLUS (SEPSIS)
1000.0000 mL | Freq: Once | INTRAVENOUS | Status: AC
Start: 2017-02-08 — End: 2017-02-08
  Administered 2017-02-08: 1000 mL via INTRAVENOUS

## 2017-02-08 NOTE — ED Provider Notes (Addendum)
[facility]PT Provider Note   CSN: [id number] Arrival date & time: 02/08/17  1521     History   Chief Complaint Chief Complaint  Patient presents with  . Numbness    HPI Chris Smith is a 41 y.o. male.  HPI Patient developed sudden onset numbness and feeling of pins and needles in HIS fingers and all of his toes onset 2 hours prior to coming here. He also reports blurred vision in both eyes, left greater than right 2 hours ago which is steadily improving. He treated himself with insulin 10 units prior to coming here. He denies chest pain denies shortness of breath denies fever denies injury denies weakness. No other associated symptoms. Nothing makes symptoms better or worse. Vision is improving steadily with time.he also reports feeling a "knot" in his neck. Days ago which he reports he "just noticed. It is not painful and does not bother him . Denies chest pain denies shortness of breath denies headache Past Medical History:  Diagnosis Date  . Acute respiratory failure (HCC) 09/2014  . Amnesia   . Aspiration pneumonia (HCC) 09/2014  . Chronic back pain   . Diabetes mellitus without complication (HCC)   . Encephalopathy 09/2014  . ETOH abuse   . Hypertension   . Ischemic hepatitis unknown  . Rhabdomyolysis unknown  . Sciatica     Patient Active Problem List   Diagnosis Date Noted  . Dehydration   . Nausea with vomiting 09/07/2016  . Hypokalemia 09/07/2016  . Abdominal pain 04/28/2016  . Enteritis 04/28/2016  . Acidosis 04/28/2016  . Prolonged Q-T interval on ECG 04/28/2016  . Hypomagnesemia 04/28/2016  . ETOH abuse 04/28/2016  . Abdominal pain, bilateral upper quadrant 04/28/2016  . Tachycardia 04/28/2016  . Tobacco abuse 04/28/2016  . Obesity 04/28/2016  . Fatty liver 04/28/2016  . Chronic back pain 04/28/2016  . Memory loss 10/25/2014  . Depression 10/25/2014  . Diabetic mononeuropathy associated with diabetes mellitus due to underlying condition (HCC)  10/25/2014  . Amnesia 10/13/2014  . Hypertension 10/13/2014  . Diabetes mellitus without complication (HCC) 10/13/2014  . Lumbago 10/13/2014    Past Surgical History:  Procedure Laterality Date  . APPENDECTOMY    . BACK SURGERY  2002   Lumbar       Home Medications    Prior to Admission medications   Medication Sig Start Date End Date Taking? Authorizing Provider  acetaminophen (TYLENOL) 500 MG tablet Take 1,000 mg by mouth every 6 (six) hours as needed for mild pain.   Yes [provider]  calcium carbonate (TUMS - DOSED IN MG ELEMENTAL CALCIUM) 500 MG chewable tablet Chew 2 tablets by mouth 2 (two) times daily as needed for indigestion or heartburn.   Yes [provider]  insulin aspart (NOVOLOG) 100 UNIT/ML injection Inject 5-20 Units into the skin 3 (three) times daily before meals. Pt gives units based on how he is feeling   Yes [provider]  insulin detemir (LEVEMIR) 100 UNIT/ML injection Inject 0.35 mLs (35 Units total) into the skin 2 (two) times daily. Patient taking differently: Inject 5-15 Units into the skin 2 (two) times daily as needed (when not using novolog).  10/13/14  Yes Jaclyn Shaggy, MD  multivitamin (ONE-A-DAY MEN'S) TABS tablet Take 1 tablet by mouth daily. 09/09/16  Yes Vassie Loll, MD  erythromycin (E-MYCIN) 250 MG tablet Take 1 tablet (250 mg total) by mouth every 8 (eight) hours as needed (nausea and vomiting). Patient not taking: Reported on 02/08/2017  09/09/16   Vassie LollMadera, Carlos, MD  nicotine (NICODERM CQ - DOSED IN MG/24 HOURS) 21 mg/24hr patch Place 1 patch (21 mg total) onto the skin daily. Patient not taking: Reported on 02/08/2017 09/10/16   Vassie LollMadera, Carlos, MD  pantoprazole (PROTONIX) 40 MG tablet Take 1 tablet (40 mg total) by mouth daily at 12 noon. Patient not taking: Reported on 02/08/2017 09/10/16   Vassie LollMadera, Carlos, MD  potassium chloride SA (K-DUR,KLOR-CON) 20 MEQ tablet Take 2 tablets (40 mEq total) by mouth daily. Patient  not taking: Reported on 02/08/2017 09/10/16   Vassie LollMadera, Carlos, MD  prochlorperazine (COMPAZINE) 10 MG tablet Take 1 tablet (10 mg total) by mouth every 6 (six) hours as needed for nausea or vomiting. Patient not taking: Reported on 02/08/2017 09/09/16   Vassie LollMadera, Carlos, MD  sucralfate (CARAFATE) 1 GM/10ML suspension Take 10 mLs (1 g total) by mouth 4 (four) times daily -  with meals and at bedtime. Patient not taking: Reported on 02/08/2017 09/09/16   Vassie LollMadera, Carlos, MD    Family History Family History  Problem Relation Age of Onset  . Cancer Mother        cause of death, ovarian & colon  . Diabetes Father   . Hypertension Father   . Gout Father   . Hyperlipidemia Father     Social History Social History  Substance Use Topics  . Smoking status: Current Every Day Smoker    Packs/day: 0.50    Years: 18.00  . Smokeless tobacco: Never Used  . Alcohol use 0.0 oz/week     Comment: 3 OR 4 DAYS PER WEEK     Allergies   Patient has no known allergies.   Review of Systems Review of Systems  Eyes: Positive for visual disturbance.  Allergic/Immunologic: Positive for immunocompromised state.       Diabetic  Neurological: Positive for numbness.  All other systems reviewed and are negative.    Physical Exam Updated Vital Signs BP 124/83   Pulse 100   Temp 98.5 F (36.9 C) (Oral)   Resp 18   Ht 6\' 2"  (1.88 m)   Wt 117.9 kg (260 lb)   SpO2 97%   BMI 33.38 kg/m   Physical Exam  Constitutional: He appears well-developed and well-nourished. He appears distressed.  Anxious appearing  HENT:  Head: Normocephalic and atraumatic.  Right Ear: External ear normal.  Left Ear: External ear normal.  Mouth/Throat: Oropharynx is clear and moist.  No facial asymmetry  Eyes: Pupils are equal, round, and reactive to light. Conjunctivae are normal.  Visual acuity 20/25 left eye 2025 right eye  Neck: Neck supple. No tracheal deviation present. No thyromegaly present.  Pea-sized mass at left  anterior neck possibly consistent with lymph node, nontender  Cardiovascular: Normal rate, regular rhythm, normal heart sounds and intact distal pulses.   No murmur heard. Pulmonary/Chest: Effort normal and breath sounds normal.  Abdominal: Soft. Bowel sounds are normal. He exhibits no distension. There is no tenderness.  Musculoskeletal: Normal range of motion. He exhibits no edema or tenderness.  All 4 extremities without redness swellingor tenderness neurovascular intact. DP pulses 2+ bilaterally radial pulses 2+ bilaterally  Neurological: He is alert. No cranial nerve deficit. He exhibits normal muscle tone. Coordination normal.  Motor strength 5 over 5 overallgait normal no asterixis  Skin: Skin is warm and dry. Capillary refill takes less than 2 seconds. No rash noted.  Psychiatric: He has a normal mood and affect.  Nursing note and vitals reviewed.  ED Treatments / Results  Labs (all labs ordered are listed, but only abnormal results are displayed) Labs Reviewed  CBC WITH DIFFERENTIAL/PLATELET - Abnormal; Notable for the following:       Result Value   RDW 15.8 (*)    Platelets 124 (*)    All other components within normal limits  COMPREHENSIVE METABOLIC PANEL - Abnormal; Notable for the following:    Potassium 3.3 (*)    Glucose, Bld 186 (*)    BUN <5 (*)    AST 95 (*)    ALT 71 (*)    All other components within normal limits  CBG MONITORING, ED - Abnormal; Notable for the following:    Glucose-Capillary 238 (*)    All other components within normal limits  I-STAT TROPONIN, ED    EKG  EKG Interpretation None     ED ECG REPORT   Date: 02/08/2017  Rate: 95  Rhythm: normal sinus rhythm  QRS Axis: normal  Intervals: PR prolonged  ST/T Wave abnormalities: normal  Conduction Disutrbances:none  Narrative Interpretation:   Old EKG Reviewed: unchanged o significant change from 09/08/2016 I have personally reviewed the EKG tracing and agree with the computerized  printout as noted.  Radiology Ct Head Wo Contrast  Result Date: 02/08/2017 CLINICAL DATA:  Numbness in hands and feet. Hypertension. Diabetes mellitus. EXAM: CT HEAD WITHOUT CONTRAST TECHNIQUE: Contiguous axial images were obtained from the base of the skull through the vertex without intravenous contrast. COMPARISON:  None. FINDINGS: Brain: The ventricles are normal in size and configuration. There is mild cerebellar atrophy. There is no intracranial mass hemorrhage, extra-axial fluid collection, or midline shift. Gray-white compartments appear within normal limits. No acute infarct evident. Vascular: No hyperdense vessel. There is slight calcification in the left carotid siphon. Skull: The bony calvarium appears intact. Sinuses/Orbits: There is mucosal thickening in several ethmoid air cells bilaterally. Other paranasal sinuses are clear. Orbits appear symmetric bilaterally. Other: Mastoid air cells are clear. IMPRESSION: Mild cerebellar atrophy. Ventricles normal in size and configuration. No intracranial mass, hemorrhage, or extra-axial fluid collection. No evident acute infarct. Slight calcification in the left carotid siphon. Areas of ethmoid sinus disease. Electronically Signed   By: Bretta Bang III M.D.   On: 02/08/2017 16:17   Results for orders placed or performed during the hospital encounter of 02/08/17  CBC with Differential  Result Value Ref Range   WBC 5.4 4.0 - 10.5 K/uL   RBC 5.33 4.22 - 5.81 MIL/uL   Hemoglobin 15.6 13.0 - 17.0 g/dL   HCT 40.9 81.1 - 91.4 %   MCV 83.5 78.0 - 100.0 fL   MCH 29.3 26.0 - 34.0 pg   MCHC 35.1 30.0 - 36.0 g/dL   RDW 78.2 (H) 95.6 - 21.3 %   Platelets 124 (L) 150 - 400 K/uL   Neutrophils Relative % 37 %   Neutro Abs 2.0 1.7 - 7.7 K/uL   Lymphocytes Relative 53 %   Lymphs Abs 2.9 0.7 - 4.0 K/uL   Monocytes Relative 9 %   Monocytes Absolute 0.5 0.1 - 1.0 K/uL   Eosinophils Relative 1 %   Eosinophils Absolute 0.0 0.0 - 0.7 K/uL   Basophils  Relative 0 %   Basophils Absolute 0.0 0.0 - 0.1 K/uL  Comprehensive metabolic panel  Result Value Ref Range   Sodium 139 135 - 145 mmol/L   Potassium 3.3 (L) 3.5 - 5.1 mmol/L   Chloride 101 101 - 111 mmol/L   CO2 26 22 -  32 mmol/L   Glucose, Bld 186 (H) 65 - 99 mg/dL   BUN <5 (L) 6 - 20 mg/dL   Creatinine, Ser 7.82 0.61 - 1.24 mg/dL   Calcium 9.1 8.9 - 95.6 mg/dL   Total Protein 7.4 6.5 - 8.1 g/dL   Albumin 4.2 3.5 - 5.0 g/dL   AST 95 (H) 15 - 41 U/L   ALT 71 (H) 17 - 63 U/L   Alkaline Phosphatase 64 38 - 126 U/L   Total Bilirubin 1.0 0.3 - 1.2 mg/dL   GFR calc non Af Amer >60 >60 mL/min   GFR calc Af Amer >60 >60 mL/min   Anion gap 12 5 - 15  POC CBG, ED  Result Value Ref Range   Glucose-Capillary 238 (H) 65 - 99 mg/dL   Comment 1 Notify RN    Comment 2 Document in Chart   I-stat troponin, ED  Result Value Ref Range   Troponin i, poc 0.01 0.00 - 0.08 ng/mL   Comment 3           Ct Head Wo Contrast  Result Date: 02/08/2017 CLINICAL DATA:  Numbness in hands and feet. Hypertension. Diabetes mellitus. EXAM: CT HEAD WITHOUT CONTRAST TECHNIQUE: Contiguous axial images were obtained from the base of the skull through the vertex without intravenous contrast. COMPARISON:  None. FINDINGS: Brain: The ventricles are normal in size and configuration. There is mild cerebellar atrophy. There is no intracranial mass hemorrhage, extra-axial fluid collection, or midline shift. Gray-white compartments appear within normal limits. No acute infarct evident. Vascular: No hyperdense vessel. There is slight calcification in the left carotid siphon. Skull: The bony calvarium appears intact. Sinuses/Orbits: There is mucosal thickening in several ethmoid air cells bilaterally. Other paranasal sinuses are clear. Orbits appear symmetric bilaterally. Other: Mastoid air cells are clear. IMPRESSION: Mild cerebellar atrophy. Ventricles normal in size and configuration. No intracranial mass, hemorrhage, or  extra-axial fluid collection. No evident acute infarct. Slight calcification in the left carotid siphon. Areas of ethmoid sinus disease. Electronically Signed   By: Bretta Bang III M.D.   On: 02/08/2017 16:17   Procedures Procedures (including critical care time)  Medications Ordered in ED Medications  sodium chloride 0.9 % bolus 1,000 mL (not administered)  ketorolac (TORADOL) 30 MG/ML injection 30 mg (not administered)    9:05 PMPatient feels unchanged after treatment with intravenous Toradol and also receivedoral potassium supplementation and normal saline bolus.he is alert and ambulatory Initial Impression / Assessment and Plan / ED Course  I have reviewed the triage vital signs and the nursing notes.  Pertinent labs & imaging results that were available during my care of the patient were reviewed by me and considered in my medical decision making (see chart for details).   there is no signs of this encephalopathy no signs of acute coronary syndrome or stroke Plan referral neurology as outpatient. Blood pressure recheck 3 weeks  Final Clinical Impressions(s) / ED Diagnoses  Diagnosis #1 paresthesias #2 hypokalemia #3 elevated blood pressure #4 hyperglycemia Final diagnoses:  None    New Prescriptions New Prescriptions   No medications on file     Doug Sou, MD 02/08/17 2111    Doug Sou, MD 02/08/17 2112

## 2017-02-08 NOTE — ED Notes (Signed)
MD notified of continued pain, no new orders.

## 2017-02-08 NOTE — Discharge Instructions (Signed)
Call [facility] or [facility] tomorrow to schedule the next available appointment

## 2017-02-08 NOTE — ED Notes (Signed)
EDP at bedside  

## 2017-02-08 NOTE — ED Notes (Signed)
Patient left at this time with all belongings. 

## 2017-02-08 NOTE — ED Triage Notes (Signed)
Pt to ED with c/o bil hand and bil lower leg numbness onset approx 1 hour ago.  Pt st's he is diabetic and did not check his sugar but took 10 units of Lantus before he got here.  Pt very anxious and tearful in triage. Pt also c/o a knot on right side of neck and blurry vision

## 2017-02-17 ENCOUNTER — Encounter (HOSPITAL_COMMUNITY): Payer: Self-pay

## 2017-02-17 ENCOUNTER — Emergency Department (HOSPITAL_COMMUNITY)
Admission: EM | Admit: 2017-02-17 | Discharge: 2017-02-17 | Disposition: A | Discharge status: Home / Self Care | Payer: Self-pay | Attending: Emergency Medicine | Admitting: Emergency Medicine

## 2017-02-17 DIAGNOSIS — Z794 Long term (current) use of insulin: Secondary | ICD-10-CM | POA: Insufficient documentation

## 2017-02-17 DIAGNOSIS — Z79899 Other long term (current) drug therapy: Secondary | ICD-10-CM | POA: Insufficient documentation

## 2017-02-17 DIAGNOSIS — I10 Essential (primary) hypertension: Secondary | ICD-10-CM | POA: Insufficient documentation

## 2017-02-17 DIAGNOSIS — K047 Periapical abscess without sinus: Secondary | ICD-10-CM | POA: Insufficient documentation

## 2017-02-17 DIAGNOSIS — R51 Headache: Secondary | ICD-10-CM | POA: Insufficient documentation

## 2017-02-17 DIAGNOSIS — E119 Type 2 diabetes mellitus without complications: Secondary | ICD-10-CM | POA: Insufficient documentation

## 2017-02-17 DIAGNOSIS — H538 Other visual disturbances: Secondary | ICD-10-CM | POA: Insufficient documentation

## 2017-02-17 DIAGNOSIS — R220 Localized swelling, mass and lump, head: Secondary | ICD-10-CM

## 2017-02-17 DIAGNOSIS — F17200 Nicotine dependence, unspecified, uncomplicated: Secondary | ICD-10-CM | POA: Insufficient documentation

## 2017-02-17 MED ORDER — HYDROCODONE-ACETAMINOPHEN 5-325 MG PO TABS: 1.0000 | ORAL_TABLET | ORAL | 0 refills | Status: DC | PRN

## 2017-02-17 MED ORDER — AMOXICILLIN-POT CLAVULANATE 875-125 MG PO TABS
1.0000 | ORAL_TABLET | Freq: Two times a day (BID) | ORAL | 0 refills | Status: DC
Start: 2017-02-17 — End: 2017-03-18

## 2017-02-17 MED ORDER — AMOXICILLIN-POT CLAVULANATE 875-125 MG PO TABS
1.0000 | ORAL_TABLET | Freq: Once | ORAL | Status: AC
  Administered 2017-02-17: 1 via ORAL
  Filled 2017-02-17: qty 1

## 2017-02-17 MED ORDER — OXYCODONE-ACETAMINOPHEN 5-325 MG PO TABS
ORAL_TABLET | ORAL | Status: AC
  Filled 2017-02-17: qty 1

## 2017-02-17 MED ORDER — OXYCODONE-ACETAMINOPHEN 5-325 MG PO TABS
1.0000 | ORAL_TABLET | ORAL | Status: DC | PRN
  Administered 2017-02-17: 1 via ORAL

## 2017-02-17 MED ORDER — IBUPROFEN 600 MG PO TABS: 600.0000 mg | ORAL_TABLET | Freq: Three times a day (TID) | ORAL | 0 refills | Status: DC | PRN

## 2017-02-17 MED ORDER — IBUPROFEN 400 MG PO TABS
600.0000 mg | ORAL_TABLET | Freq: Once | ORAL | Status: AC
  Administered 2017-02-17: 08:00:00 600 mg via ORAL
  Filled 2017-02-17: qty 1

## 2017-02-17 NOTE — ED Provider Notes (Signed)
[facility] Provider Note   CSN: [id number] Arrival date & time: 02/17/17  0630     History   Chief Complaint Chief Complaint  Patient presents with  . Facial Swelling    HPI Chris Smith is a 41 y.o. male.  HPI  41 year old male with a hx of DM presents with acute right facial swelling and pain. Pain is severe. Started about 1 hour prior. Has chronic mandibular dental problems but denies current dental pain. No fevers. No vomiting, trouble breathing, swallowing or neck pain. Pain is causing a headache. Took 2 tylenol prior to arrival. Given a percocet in the waiting room. States that he has had right blurry vision since this started as well.   Past Medical History:  Diagnosis Date  . Acute respiratory failure (HCC) 09/2014  . Amnesia   . Aspiration pneumonia (HCC) 09/2014  . Chronic back pain   . Diabetes mellitus without complication (HCC)   . Encephalopathy 09/2014  . ETOH abuse   . Hypertension   . Ischemic hepatitis unknown  . Rhabdomyolysis unknown  . Sciatica     Patient Active Problem List   Diagnosis Date Noted  . Dehydration   . Nausea with vomiting 09/07/2016  . Hypokalemia 09/07/2016  . Abdominal pain 04/28/2016  . Enteritis 04/28/2016  . Acidosis 04/28/2016  . Prolonged Q-T interval on ECG 04/28/2016  . Hypomagnesemia 04/28/2016  . ETOH abuse 04/28/2016  . Abdominal pain, bilateral upper quadrant 04/28/2016  . Tachycardia 04/28/2016  . Tobacco abuse 04/28/2016  . Obesity 04/28/2016  . Fatty liver 04/28/2016  . Chronic back pain 04/28/2016  . Memory loss 10/25/2014  . Depression 10/25/2014  . Diabetic mononeuropathy associated with diabetes mellitus due to underlying condition (HCC) 10/25/2014  . Amnesia 10/13/2014  . Hypertension 10/13/2014  . Diabetes mellitus without complication (HCC) 10/13/2014  . Lumbago 10/13/2014    Past Surgical History:  Procedure Laterality Date  . APPENDECTOMY    . BACK SURGERY  2002   Lumbar        Home Medications    Prior to Admission medications   Medication Sig Start Date End Date Taking? Authorizing Provider  acetaminophen (TYLENOL) 500 MG tablet Take 1,000 mg by mouth every 6 (six) hours as needed for mild pain.    [provider]  amoxicillin-clavulanate (AUGMENTIN) 875-125 MG tablet Take 1 tablet by mouth 2 (two) times daily. One po bid x 7 days 02/17/17   Pricilla Loveless, MD  calcium carbonate (TUMS - DOSED IN MG ELEMENTAL CALCIUM) 500 MG chewable tablet Chew 2 tablets by mouth 2 (two) times daily as needed for indigestion or heartburn.    [provider]  erythromycin (E-MYCIN) 250 MG tablet Take 1 tablet (250 mg total) by mouth every 8 (eight) hours as needed (nausea and vomiting). Patient not taking: Reported on 02/08/2017 09/09/16   Vassie Loll, MD  HYDROcodone-acetaminophen [facility]) 5-325 MG tablet Take 1-2 tablets by mouth every 4 (four) hours as needed for severe pain. 02/17/17   Pricilla Loveless, MD  ibuprofen (ADVIL,MOTRIN) 600 MG tablet Take 1 tablet (600 mg total) by mouth every 8 (eight) hours as needed. 02/17/17   Pricilla Loveless, MD  insulin aspart (NOVOLOG) 100 UNIT/ML injection Inject 5-20 Units into the skin 3 (three) times daily before meals. Pt gives units based on how he is feeling    [provider]  insulin detemir (LEVEMIR) 100 UNIT/ML injection Inject 0.35 mLs (35 Units total) into the skin 2 (two) times daily.  Patient taking differently: Inject 5-15 Units into the skin 2 (two) times daily as needed (when not using novolog).  10/13/14   Jaclyn Shaggy, MD  multivitamin (ONE-A-DAY MEN'S) TABS tablet Take 1 tablet by mouth daily. 09/09/16   Vassie Loll, MD  nicotine (NICODERM CQ - DOSED IN MG/24 HOURS) 21 mg/24hr patch Place 1 patch (21 mg total) onto the skin daily. Patient not taking: Reported on 02/08/2017 09/10/16   Vassie Loll, MD  pantoprazole (PROTONIX) 40 MG tablet Take 1 tablet (40 mg total) by mouth daily at 12  noon. Patient not taking: Reported on 02/08/2017 09/10/16   Vassie Loll, MD  potassium chloride SA (K-DUR,KLOR-CON) 20 MEQ tablet Take 2 tablets (40 mEq total) by mouth daily. Patient not taking: Reported on 02/08/2017 09/10/16   Vassie Loll, MD  prochlorperazine (COMPAZINE) 10 MG tablet Take 1 tablet (10 mg total) by mouth every 6 (six) hours as needed for nausea or vomiting. Patient not taking: Reported on 02/08/2017 09/09/16   Vassie Loll, MD  sucralfate (CARAFATE) 1 GM/10ML suspension Take 10 mLs (1 g total) by mouth 4 (four) times daily -  with meals and at bedtime. Patient not taking: Reported on 02/08/2017 09/09/16   Vassie Loll, MD    Family History Family History  Problem Relation Age of Onset  . Cancer Mother        cause of death, ovarian & colon  . Diabetes Father   . Hypertension Father   . Gout Father   . Hyperlipidemia Father     Social History Social History  Substance Use Topics  . Smoking status: Current Every Day Smoker    Packs/day: 0.50    Years: 18.00  . Smokeless tobacco: Never Used  . Alcohol use 0.0 oz/week     Comment: 3 OR 4 DAYS PER WEEK     Allergies   Patient has no known allergies.   Review of Systems Review of Systems  Constitutional: Negative for fever.  HENT: Positive for facial swelling. Negative for dental problem and trouble swallowing.   Eyes: Positive for visual disturbance.  Respiratory: Negative for shortness of breath.   Gastrointestinal: Negative for vomiting.  Neurological: Negative for weakness and numbness.  All other systems reviewed and are negative.    Physical Exam Updated Vital Signs BP (!) 157/121 (BP Location: Right Arm)   Pulse (!) 106   Temp 97.7 F (36.5 C) (Oral)   Resp 20   SpO2 99%   Physical Exam  Constitutional: He is oriented to person, place, and time. He appears well-developed and well-nourished.  HENT:  Head: Normocephalic and atraumatic.    Right Ear: External ear normal.  Left Ear:  External ear normal.  Nose: Nose normal.  Mouth/Throat: Abnormal dentition.  Diffuse dental caries, maxillary and mandibular on the right. Focal tenderness along gum/cheek line without induration/drainable abscess  Eyes: Pupils are equal, round, and reactive to light. EOM are normal. Right eye exhibits no discharge. Left eye exhibits no discharge.  Neck: Normal range of motion. Neck supple.  Cardiovascular: Normal rate, regular rhythm and normal heart sounds.   Pulmonary/Chest: Effort normal and breath sounds normal. No stridor. He has no wheezes.  Musculoskeletal: He exhibits no edema.  Neurological: He is alert and oriented to person, place, and time.  CN 3-12 grossly intact. 5/5 strength in all 4 extremities. Grossly normal sensation.   Skin: Skin is warm and dry.  Nursing note and vitals reviewed.    ED Treatments / Results  Labs (  all labs ordered are listed, but only abnormal results are displayed) Labs Reviewed - No data to display  EKG  EKG Interpretation None       Radiology No results found.  Procedures Procedures (including critical care time)  Medications Ordered in ED Medications  oxyCODONE-acetaminophen (PERCOCET/ROXICET) 5-325 MG per tablet 1 tablet (1 tablet Oral Given 02/17/17 1610)  amoxicillin-clavulanate (AUGMENTIN) 875-125 MG per tablet 1 tablet (1 tablet Oral Given 02/17/17 0745)  ibuprofen (ADVIL,MOTRIN) tablet 600 mg (600 mg Oral Given 02/17/17 0745)     Initial Impression / Assessment and Plan / ED Course  I have reviewed the triage vital signs and the nursing notes.  Pertinent labs & imaging results that were available during my care of the patient were reviewed by me and considered in my medical decision making (see chart for details).     Patient is well appearing. No intra-oral or neck swelling or concern for airway instability. No vomiting. Given the rapid onset and overall poor dentition, this is most likely dental. No palpable abscess. Will  start on antibiotics, pain control. He notes blurry vision but neuro exam is benign and his visual acuity is 20/25 bilaterally. Likely from some swelling but I doubt acute neuro abnormality. Discussed the need for close dental follow up. Strict return precautions.   Final Clinical Impressions(s) / ED Diagnoses   Final diagnoses:  Right facial swelling  Dental abscess    New Prescriptions Discharge Medication List as of 02/17/2017  7:34 AM    START taking these medications   Details  amoxicillin-clavulanate (AUGMENTIN) 875-125 MG tablet Take 1 tablet by mouth 2 (two) times daily. One po bid x 7 days, Starting Sat 02/17/2017, Print    HYDROcodone-acetaminophen (NORCO) 5-325 MG tablet Take 1-2 tablets by mouth every 4 (four) hours as needed for severe pain., Starting Sat 02/17/2017, Print    ibuprofen (ADVIL,MOTRIN) 600 MG tablet Take 1 tablet (600 mg total) by mouth every 8 (eight) hours as needed., Starting Sat 02/17/2017, Print         Pricilla Loveless, MD 02/17/17 [phone]

## 2017-02-17 NOTE — ED Triage Notes (Signed)
Pt states that he woke up about 20 minutes ago with swelling to the R side of his cheek. Poor dental hygiene. Pt states that the swelling is making his vision blurry on the R side.

## 2017-03-18 ENCOUNTER — Emergency Department (HOSPITAL_COMMUNITY)
Admission: EM | Admit: 2017-03-18 | Discharge: 2017-03-18 | Disposition: A | Discharge status: Home / Self Care | Payer: Self-pay | Attending: Emergency Medicine | Admitting: Emergency Medicine

## 2017-03-18 ENCOUNTER — Encounter (HOSPITAL_COMMUNITY): Payer: Self-pay | Admitting: *Deleted

## 2017-03-18 DIAGNOSIS — I10 Essential (primary) hypertension: Secondary | ICD-10-CM | POA: Insufficient documentation

## 2017-03-18 DIAGNOSIS — Z794 Long term (current) use of insulin: Secondary | ICD-10-CM | POA: Insufficient documentation

## 2017-03-18 DIAGNOSIS — F17210 Nicotine dependence, cigarettes, uncomplicated: Secondary | ICD-10-CM | POA: Insufficient documentation

## 2017-03-18 DIAGNOSIS — K0889 Other specified disorders of teeth and supporting structures: Secondary | ICD-10-CM | POA: Insufficient documentation

## 2017-03-18 DIAGNOSIS — E119 Type 2 diabetes mellitus without complications: Secondary | ICD-10-CM | POA: Insufficient documentation

## 2017-03-18 DIAGNOSIS — Z791 Long term (current) use of non-steroidal anti-inflammatories (NSAID): Secondary | ICD-10-CM | POA: Insufficient documentation

## 2017-03-18 DIAGNOSIS — Z79899 Other long term (current) drug therapy: Secondary | ICD-10-CM | POA: Insufficient documentation

## 2017-03-18 DIAGNOSIS — G8929 Other chronic pain: Secondary | ICD-10-CM | POA: Insufficient documentation

## 2017-03-18 MED ORDER — PENICILLIN V POTASSIUM 250 MG PO TABS: 500.0000 mg | ORAL_TABLET | Freq: Four times a day (QID) | ORAL | Status: DC

## 2017-03-18 MED ORDER — NAPROXEN 500 MG PO TABS: 500.0000 mg | ORAL_TABLET | Freq: Two times a day (BID) | ORAL | 0 refills | Status: DC

## 2017-03-18 MED ORDER — PENICILLIN V POTASSIUM 500 MG PO TABS: 500.0000 mg | ORAL_TABLET | Freq: Four times a day (QID) | ORAL | 0 refills | Status: DC

## 2017-03-18 MED ORDER — NAPROXEN 250 MG PO TABS
500.0000 mg | ORAL_TABLET | Freq: Once | ORAL | Status: AC
  Administered 2017-03-18: 500 mg via ORAL
  Filled 2017-03-18: qty 2

## 2017-03-18 MED ORDER — PENICILLIN V POTASSIUM 250 MG PO TABS
500.0000 mg | ORAL_TABLET | Freq: Once | ORAL | Status: AC
  Administered 2017-03-18: 500 mg via ORAL
  Filled 2017-03-18: qty 2

## 2017-03-18 NOTE — ED Triage Notes (Signed)
The pt is c/o a toothache  For several days  He has had a problem with this same tooth another time

## 2017-03-18 NOTE — Discharge Instructions (Signed)
Take the prescribed medication as directed. Follow-up with dentist.  You can see the dentist on call if unable to get in with who you have picked out. Return to the ED for new or worsening symptoms.

## 2017-03-18 NOTE — ED Provider Notes (Signed)
[facility] DEPT Provider Note   CSN: [id number] Arrival date & time: 03/18/17  4098     History   Chief Complaint Chief Complaint  Patient presents with  . Dental Pain    HPI Chris Smith is a 41 y.o. male.  The history is provided by the patient and medical records.  Dental Pain      41 year old male with history of chronic back pain, diabetes, alcohol abuse, hypertension, sciatica, presenting to the ED for right upper dental pain. Patient reports a piece of his tooth broke up this morning. States he actually got tripped up on it and thinks he may have swallowed it. States he has noticed some swelling in his right upper cheek. Report incidents of similar last month and he wanted to come get it checked out before it got too bad.  Reports subjective fever.  No intervention tried PTA.  Not established with dentist at present, however dad is good friends with one who he hopes can see him later this week.  Past Medical History:  Diagnosis Date  . Acute respiratory failure (HCC) 09/2014  . Amnesia   . Aspiration pneumonia (HCC) 09/2014  . Chronic back pain   . Diabetes mellitus without complication (HCC)   . Encephalopathy 09/2014  . ETOH abuse   . Hypertension   . Ischemic hepatitis unknown  . Rhabdomyolysis unknown  . Sciatica     Patient Active Problem List   Diagnosis Date Noted  . Dehydration   . Nausea with vomiting 09/07/2016  . Hypokalemia 09/07/2016  . Abdominal pain 04/28/2016  . Enteritis 04/28/2016  . Acidosis 04/28/2016  . Prolonged Q-T interval on ECG 04/28/2016  . Hypomagnesemia 04/28/2016  . ETOH abuse 04/28/2016  . Abdominal pain, bilateral upper quadrant 04/28/2016  . Tachycardia 04/28/2016  . Tobacco abuse 04/28/2016  . Obesity 04/28/2016  . Fatty liver 04/28/2016  . Chronic back pain 04/28/2016  . Memory loss 10/25/2014  . Depression 10/25/2014  . Diabetic mononeuropathy associated with diabetes mellitus due to underlying condition (HCC)  10/25/2014  . Amnesia 10/13/2014  . Hypertension 10/13/2014  . Diabetes mellitus without complication (HCC) 10/13/2014  . Lumbago 10/13/2014    Past Surgical History:  Procedure Laterality Date  . APPENDECTOMY    . BACK SURGERY  2002   Lumbar       Home Medications    Prior to Admission medications   Medication Sig Start Date End Date Taking? Authorizing Provider  acetaminophen (TYLENOL) 500 MG tablet Take 1,000 mg by mouth every 6 (six) hours as needed for mild pain.    [provider]  amoxicillin-clavulanate (AUGMENTIN) 875-125 MG tablet Take 1 tablet by mouth 2 (two) times daily. One po bid x 7 days 02/17/17   Pricilla Loveless, MD  calcium carbonate (TUMS - DOSED IN MG ELEMENTAL CALCIUM) 500 MG chewable tablet Chew 2 tablets by mouth 2 (two) times daily as needed for indigestion or heartburn.    [provider]  erythromycin (E-MYCIN) 250 MG tablet Take 1 tablet (250 mg total) by mouth every 8 (eight) hours as needed (nausea and vomiting). Patient not taking: Reported on 02/08/2017 09/09/16   Vassie Loll, MD  HYDROcodone-acetaminophen [facility]) 5-325 MG tablet Take 1-2 tablets by mouth every 4 (four) hours as needed for severe pain. 02/17/17   Pricilla Loveless, MD  ibuprofen (ADVIL,MOTRIN) 600 MG tablet Take 1 tablet (600 mg total) by mouth every 8 (eight) hours as needed. 02/17/17   Pricilla Loveless, MD  insulin aspart (NOVOLOG)  100 UNIT/ML injection Inject 5-20 Units into the skin 3 (three) times daily before meals. Pt gives units based on how he is feeling    [provider]  insulin detemir (LEVEMIR) 100 UNIT/ML injection Inject 0.35 mLs (35 Units total) into the skin 2 (two) times daily. Patient taking differently: Inject 5-15 Units into the skin 2 (two) times daily as needed (when not using novolog).  10/13/14   Jaclyn Shaggy, MD  multivitamin (ONE-A-DAY MEN'S) TABS tablet Take 1 tablet by mouth daily. 09/09/16   Vassie Loll, MD  nicotine (NICODERM CQ -  DOSED IN MG/24 HOURS) 21 mg/24hr patch Place 1 patch (21 mg total) onto the skin daily. Patient not taking: Reported on 02/08/2017 09/10/16   Vassie Loll, MD  pantoprazole (PROTONIX) 40 MG tablet Take 1 tablet (40 mg total) by mouth daily at 12 noon. Patient not taking: Reported on 02/08/2017 09/10/16   Vassie Loll, MD  potassium chloride SA (K-DUR,KLOR-CON) 20 MEQ tablet Take 2 tablets (40 mEq total) by mouth daily. Patient not taking: Reported on 02/08/2017 09/10/16   Vassie Loll, MD  prochlorperazine (COMPAZINE) 10 MG tablet Take 1 tablet (10 mg total) by mouth every 6 (six) hours as needed for nausea or vomiting. Patient not taking: Reported on 02/08/2017 09/09/16   Vassie Loll, MD  sucralfate (CARAFATE) 1 GM/10ML suspension Take 10 mLs (1 g total) by mouth 4 (four) times daily -  with meals and at bedtime. Patient not taking: Reported on 02/08/2017 09/09/16   Vassie Loll, MD    Family History Family History  Problem Relation Age of Onset  . Cancer Mother        cause of death, ovarian & colon  . Diabetes Father   . Hypertension Father   . Gout Father   . Hyperlipidemia Father     Social History Social History  Substance Use Topics  . Smoking status: Current Every Day Smoker    Packs/day: 0.50    Years: 18.00  . Smokeless tobacco: Never Used  . Alcohol use 0.0 oz/week     Comment: 3 OR 4 DAYS PER WEEK     Allergies   Patient has no known allergies.   Review of Systems Review of Systems  HENT: Positive for dental problem.   All other systems reviewed and are negative.    Physical Exam Updated Vital Signs BP (!) 160/119   Pulse (!) 106   Temp 99.1 F (37.3 C) (Oral)   Resp 17   Ht  (1.88 m)   Wt 117.9 kg (260 lb)   SpO2 95%   BMI 33.38 kg/m   Physical Exam  Constitutional: He is oriented to person, place, and time. He appears well-developed and well-nourished.  HENT:  Head: Normocephalic and atraumatic.  Mouth/Throat: Oropharynx is clear and  moist.  Teeth largely in poor dentition, right upper molars are all severely decayed with large cavities present, surrounding gingiva swollen along outer aspect with some extension into upper right cheek, handling secretions appropriately, no trismus, no neck swelling, normal phonation without stridor  Eyes: Pupils are equal, round, and reactive to light. Conjunctivae and EOM are normal.  Neck: Normal range of motion.  Cardiovascular: Normal rate, regular rhythm and normal heart sounds.   Pulmonary/Chest: Effort normal and breath sounds normal. No respiratory distress. He has no wheezes.  Abdominal: Soft. Bowel sounds are normal. There is no tenderness. There is no rebound.  Musculoskeletal: Normal range of motion.  Neurological: He is alert and oriented  to person, place, and time.  Skin: Skin is warm and dry.  Psychiatric: He has a normal mood and affect.  Nursing note and vitals reviewed.    ED Treatments / Results  Labs (all labs ordered are listed, but only abnormal results are displayed) Labs Reviewed - No data to display  EKG  EKG Interpretation None       Radiology No results found.  Procedures Procedures (including critical care time)  Medications Ordered in ED Medications  naproxen (NAPROSYN) tablet 500 mg (500 mg Oral Given 03/18/17 0750)  penicillin v potassium (VEETID) tablet 500 mg (500 mg Oral Given 03/18/17 0750)     Initial Impression / Assessment and Plan / ED Course  I have reviewed the triage vital signs and the nursing notes.  Pertinent labs & imaging results that were available during my care of the patient were reviewed by me and considered in my medical decision making (see chart for details).  41 year old male here with right upper dental pain after piece of his tooth broke off this morning. He has generally poor dentition, multiple decayed and broken teeth of the right upper.  Does have some gingival swelling and irritation. Mild swelling of her  right upper cheek. No neck swelling. Tolerating secretions well. No stridor. No clinical signs or symptoms concerning for Ludwig's angina. Will start on antibiotics and have him follow-up closely with dentist. He was given information for dentist on call.  Discussed plan with patient, he acknowledged understanding and agreed with plan of care.  Return precautions given for new or worsening symptoms.  Final Clinical Impressions(s) / ED Diagnoses   Final diagnoses:  Pain, dental    New Prescriptions New Prescriptions   NAPROXEN (NAPROSYN) 500 MG TABLET    Take 1 tablet (500 mg total) by mouth 2 (two) times daily with a meal.   PENICILLIN V POTASSIUM (VEETID) 500 MG TABLET    Take 1 tablet (500 mg total) by mouth 4 (four) times daily.     Garlon Hatchet, PA-C 03/18/17 1610    Linwood Dibbles, MD 03/19/17 [phone]

## 2017-03-26 ENCOUNTER — Emergency Department (HOSPITAL_COMMUNITY): Payer: Self-pay

## 2017-03-26 ENCOUNTER — Emergency Department (HOSPITAL_COMMUNITY)
Admission: EM | Admit: 2017-03-26 | Discharge: 2017-03-26 | Disposition: A | Discharge status: Home / Self Care | Payer: Self-pay | Attending: Emergency Medicine | Admitting: Emergency Medicine

## 2017-03-26 ENCOUNTER — Encounter (HOSPITAL_COMMUNITY): Payer: Self-pay | Admitting: *Deleted

## 2017-03-26 DIAGNOSIS — I10 Essential (primary) hypertension: Secondary | ICD-10-CM | POA: Insufficient documentation

## 2017-03-26 DIAGNOSIS — M6281 Muscle weakness (generalized): Secondary | ICD-10-CM | POA: Insufficient documentation

## 2017-03-26 DIAGNOSIS — E119 Type 2 diabetes mellitus without complications: Secondary | ICD-10-CM | POA: Insufficient documentation

## 2017-03-26 DIAGNOSIS — F17200 Nicotine dependence, unspecified, uncomplicated: Secondary | ICD-10-CM | POA: Insufficient documentation

## 2017-03-26 DIAGNOSIS — R29898 Other symptoms and signs involving the musculoskeletal system: Secondary | ICD-10-CM

## 2017-03-26 DIAGNOSIS — Z794 Long term (current) use of insulin: Secondary | ICD-10-CM | POA: Insufficient documentation

## 2017-03-26 DIAGNOSIS — K7689 Other specified diseases of liver: Secondary | ICD-10-CM | POA: Insufficient documentation

## 2017-03-26 DIAGNOSIS — F10920 Alcohol use, unspecified with intoxication, uncomplicated: Secondary | ICD-10-CM

## 2017-03-26 DIAGNOSIS — F10929 Alcohol use, unspecified with intoxication, unspecified: Secondary | ICD-10-CM | POA: Insufficient documentation

## 2017-03-26 DIAGNOSIS — Z79899 Other long term (current) drug therapy: Secondary | ICD-10-CM | POA: Insufficient documentation

## 2017-03-26 LAB — DIFFERENTIAL
Basophils Absolute: 0 10*3/uL (ref 0.0–0.1)
Basophils Relative: 0 %
EOS PCT: 2 %
Eosinophils Absolute: 0.2 10*3/uL (ref 0.0–0.7)
LYMPHS ABS: 4.8 10*3/uL — AB (ref 0.7–4.0)
LYMPHS PCT: 61 %
MONO ABS: 0.5 10*3/uL (ref 0.1–1.0)
MONOS PCT: 6 %
Neutro Abs: 2.5 10*3/uL (ref 1.7–7.7)
Neutrophils Relative %: 31 %

## 2017-03-26 LAB — COMPREHENSIVE METABOLIC PANEL
ALK PHOS: 57 U/L (ref 38–126)
ALT: 35 U/L (ref 17–63)
ANION GAP: 12 (ref 5–15)
AST: 27 U/L (ref 15–41)
Albumin: 4 g/dL (ref 3.5–5.0)
BILIRUBIN TOTAL: 0.4 mg/dL (ref 0.3–1.2)
BUN: 6 mg/dL (ref 6–20)
CALCIUM: 9 mg/dL (ref 8.9–10.3)
CO2: 24 mmol/L (ref 22–32)
CREATININE: 0.58 mg/dL — AB (ref 0.61–1.24)
Chloride: 99 mmol/L — ABNORMAL LOW (ref 101–111)
GFR calc non Af Amer: >60 mL/min (ref >60)
Glucose, Bld: 340 mg/dL — ABNORMAL HIGH (ref 65–99)
Potassium: 3.8 mmol/L (ref 3.5–5.1)
Sodium: 135 mmol/L (ref 135–145)
TOTAL PROTEIN: 7.3 g/dL (ref 6.5–8.1)

## 2017-03-26 LAB — CBG MONITORING, ED: Glucose-Capillary: 374 mg/dL — ABNORMAL HIGH (ref 65–99)

## 2017-03-26 LAB — I-STAT TROPONIN, ED: Troponin i, poc: 0.01 ng/mL (ref 0.00–0.08)

## 2017-03-26 LAB — CBC
HEMATOCRIT: 46 % (ref 39.0–52.0)
HEMOGLOBIN: 15.8 g/dL (ref 13.0–17.0)
MCH: 29.2 pg (ref 26.0–34.0)
MCHC: 34.3 g/dL (ref 30.0–36.0)
MCV: 84.9 fL (ref 78.0–100.0)
Platelets: 251 10*3/uL (ref 150–400)
RBC: 5.42 MIL/uL (ref 4.22–5.81)
RDW: 13.6 % (ref 11.5–15.5)
WBC: 8 10*3/uL (ref 4.0–10.5)

## 2017-03-26 LAB — I-STAT CHEM 8, ED
BUN: 6 mg/dL (ref 6–20)
CALCIUM ION: 1.03 mmol/L — AB (ref 1.15–1.40)
Chloride: 101 mmol/L (ref 101–111)
Creatinine, Ser: 0.9 mg/dL (ref 0.61–1.24)
GLUCOSE: 360 mg/dL — AB (ref 65–99)
HCT: 49 % (ref 39.0–52.0)
Hemoglobin: 16.7 g/dL (ref 13.0–17.0)
Potassium: 3.8 mmol/L (ref 3.5–5.1)
SODIUM: 137 mmol/L (ref 135–145)
TCO2: 24 mmol/L (ref 22–32)

## 2017-03-26 LAB — PROTIME-INR
INR: 0.96
Prothrombin Time: 12.7 seconds (ref 11.4–15.2)

## 2017-03-26 LAB — APTT: aPTT: 29 seconds (ref 24–36)
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
## 2017-03-26 NOTE — Progress Notes (Addendum)
Seen briefly after activation of code stroke. He has severe pain in his left hand and is having difficulty moving it due to pain. He has no findings in his leg or face, no other symptoms suggestive of stroke. This pain is not typical stroke, I would be concerned for nonneurological causes of this. At this time, I will defer to the emergency department for further evaluation, please call if neurology can be of further assistance or full consultation is needed.  Ritta Slot, MD [facility] [phone]  If 7pm- 7am, please page neurology on call as listed in [facility]ION.

## 2017-03-26 NOTE — ED Notes (Signed)
MD at bedside. 

## 2017-03-26 NOTE — ED Notes (Signed)
Pt transported to CT ?

## 2017-03-26 NOTE — ED Notes (Signed)
Stroke team assessing patient at triage

## 2017-03-26 NOTE — ED Triage Notes (Signed)
Pt states he cannot work left hand or leg that started at 1330 and he is crying and states he cannot move hand.  No pain.  Pt states everything started while watching TV.  Pt smells of etoh.  CBG 374.  Code Stroke called

## 2017-03-26 NOTE — Progress Notes (Signed)
Orthopedic Tech Progress Note Patient Details:  Chris Smith 30-Jun-1976 [id number]  Ortho Devices Type of Ortho Device: Velcro wrist splint Ortho Device/Splint Location: applied velcro wrist splint to pt left hand/wrist. pt tolerated application very well well.  left wrist.  Ortho Device/Splint Interventions: Application, Adjustment   Alvina Chou 03/26/2017, 5:13 PM

## 2017-03-26 NOTE — ED Notes (Signed)
Patient able to ambulate independently  

## 2017-03-26 NOTE — ED Notes (Signed)
Code stroke cancelled 

## 2017-11-16 IMAGING — US US ABDOMEN LIMITED
1 series · 14 of 25 positions shown · non-contrast
Comparison: CT scan 04/28/2016

CLINICAL DATA: Abdominal pain

EXAM:
US ABDOMEN LIMITED - RIGHT UPPER QUADRANT

[Series 1: us abdomen limited · 0.30mm/px · 14 of 48 slices shown]
[im 1/48]
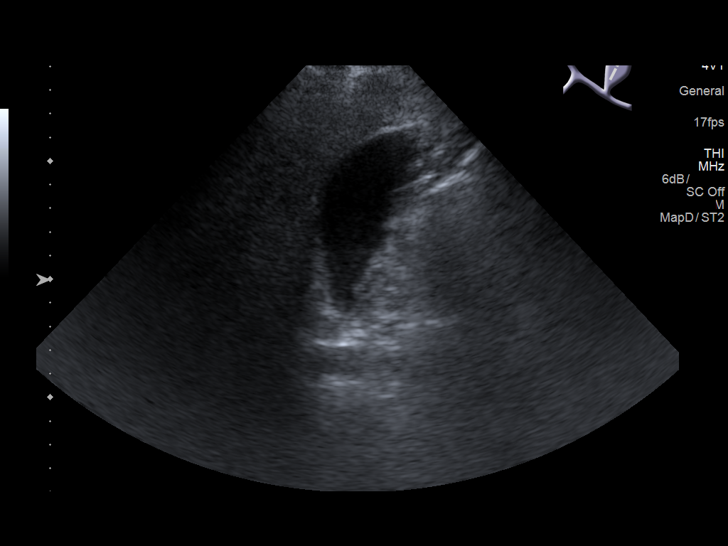
[im 4/48]
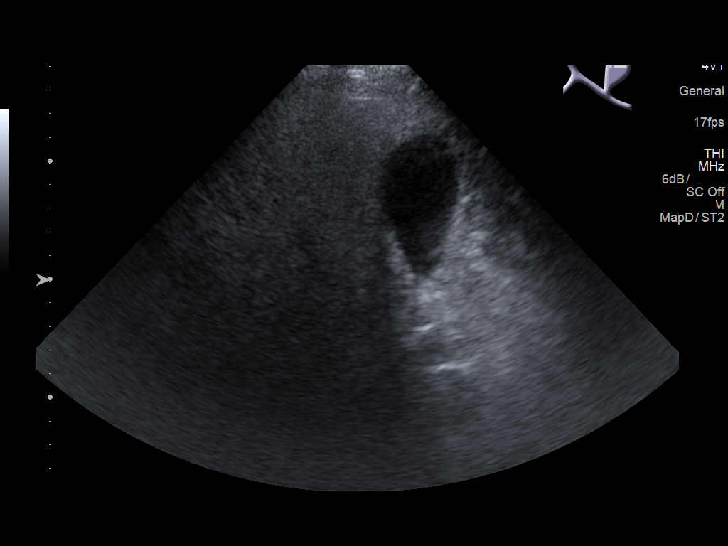
[im 8/48]
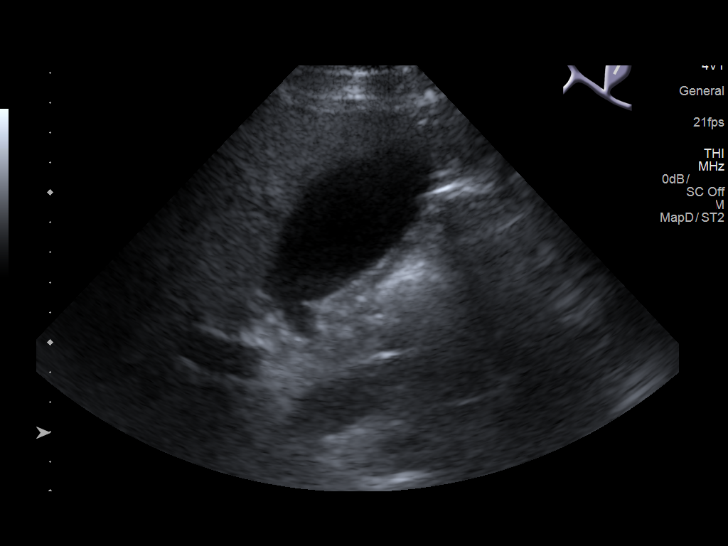
[im 12/48]
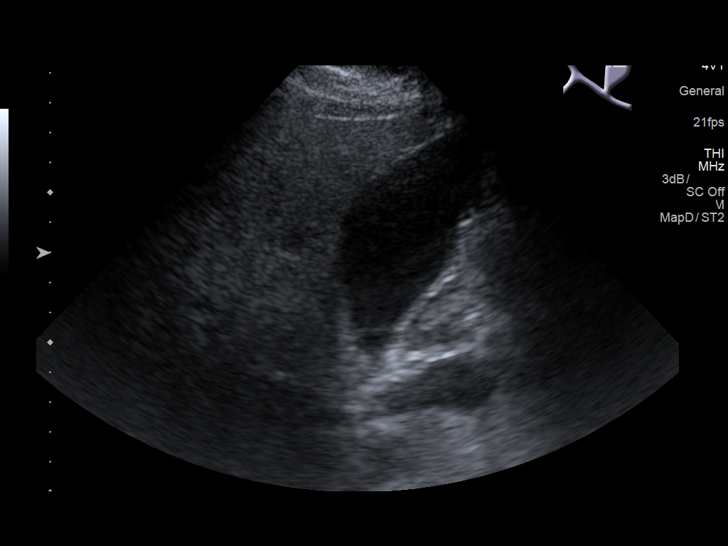
[im 16/48]
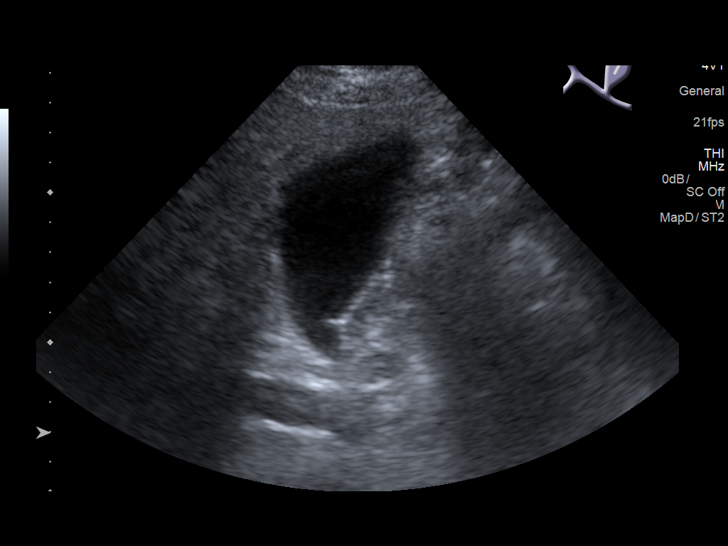
[im 18/48]
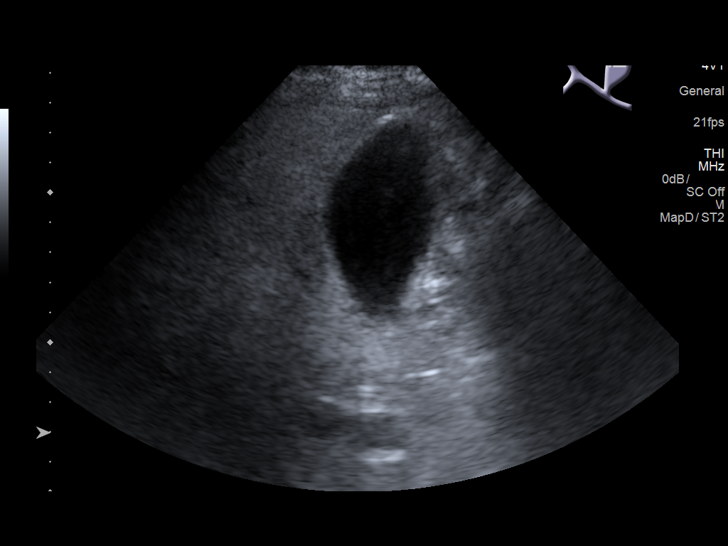
[im 22/48]
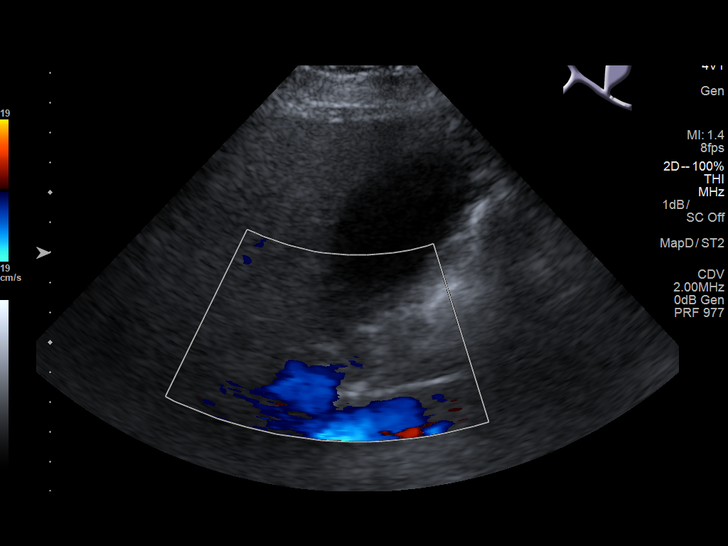
[im 26/48]
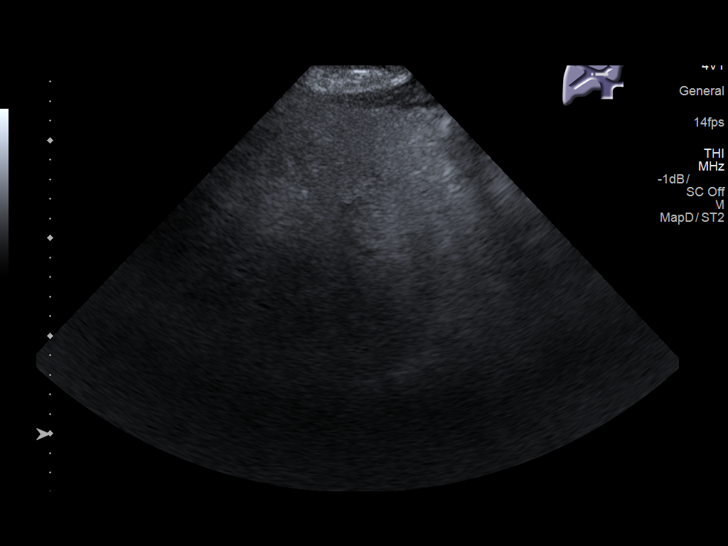
[im 30/48]
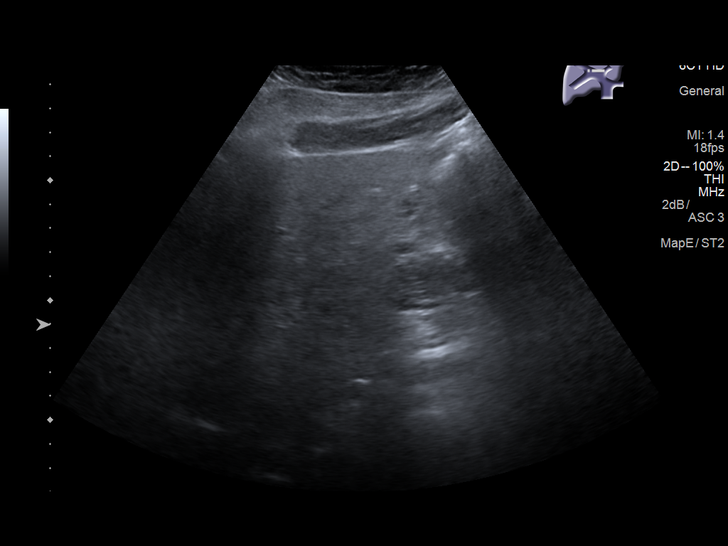
[im 32/48]
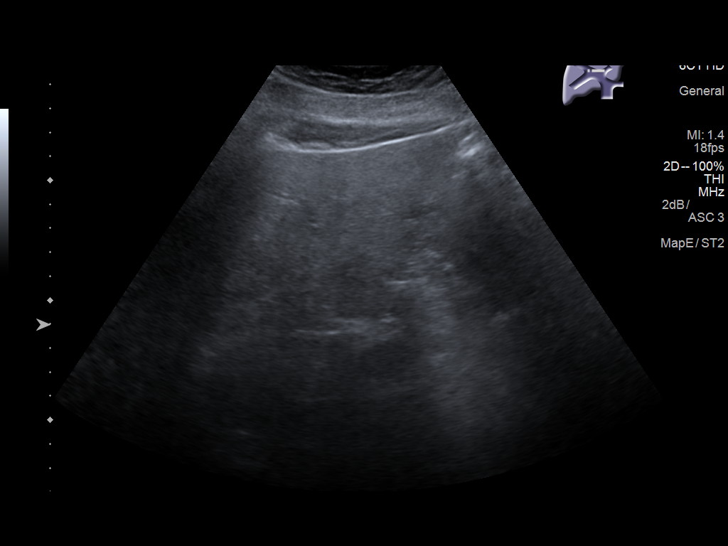
[im 36/48]
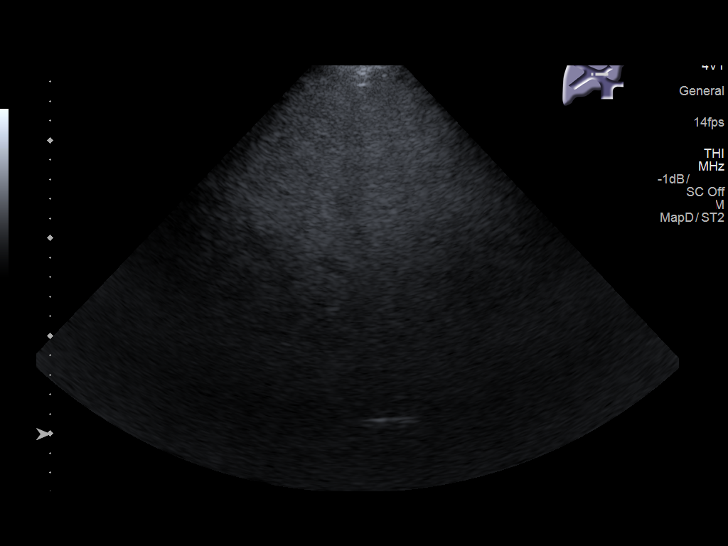
[im 40/48]
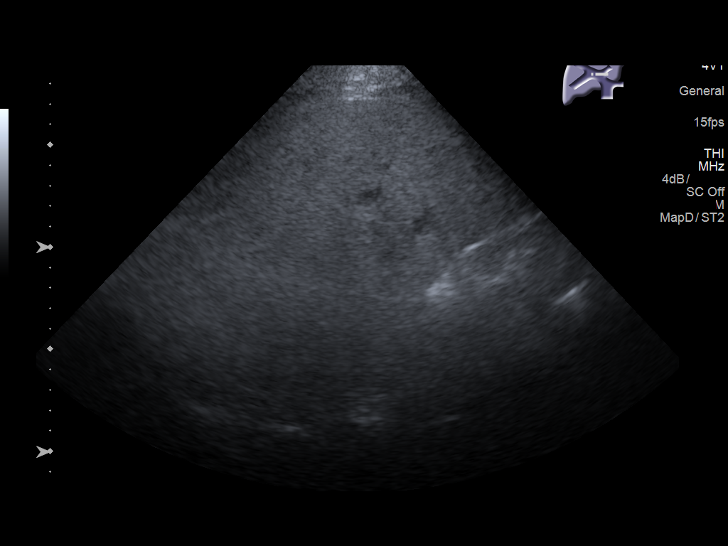
[im 44/48]
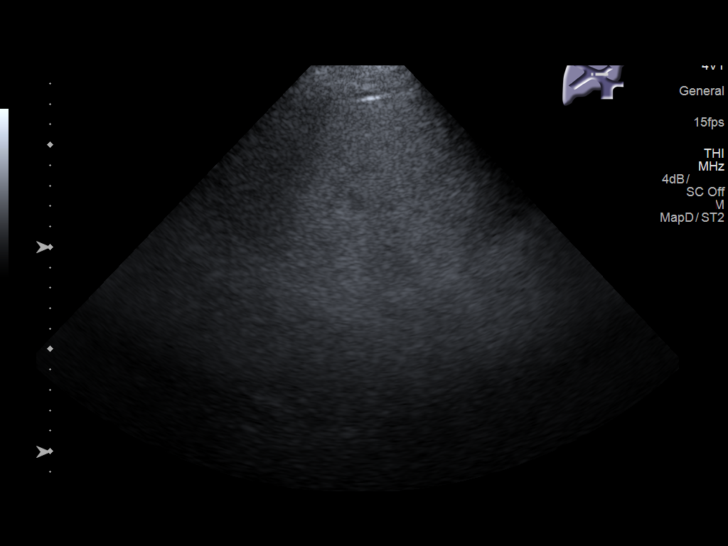
[im 48/48]
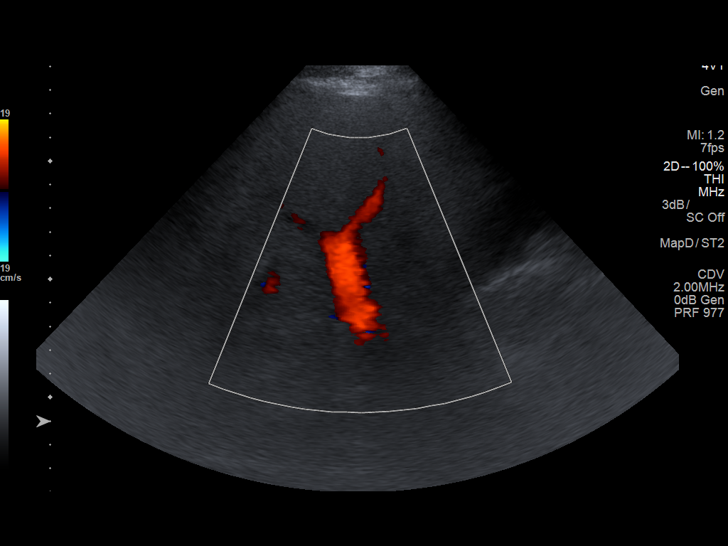

[14 of 25 positions shown; findings below may reference images not displayed]

FINDINGS: Gallbladder:

No gallstones or wall thickening visualized. No sonographic Murphy
sign noted by sonographer. Small a moderate echogenic sludge within
the gallbladder lumen.

Common bile duct:

Diameter: Normal at 4 mm.

Liver:

Increased echogenicity consistent with fatty infiltration. No
ascites.
IMPRESSION: Gallbladder sludge without sonographic evidence for acute
cholecystitis. Normal common bowel duct diameter. Fatty infiltration
of the liver.

## 2019-02-10 ENCOUNTER — Emergency Department (HOSPITAL_COMMUNITY)
Admission: EM | Admit: 2019-02-10 | Discharge: 2019-02-10 | Disposition: A | Discharge status: Home / Self Care | Payer: Self-pay | Attending: Emergency Medicine | Admitting: Emergency Medicine

## 2019-02-10 ENCOUNTER — Encounter (HOSPITAL_COMMUNITY): Payer: Self-pay | Admitting: *Deleted

## 2019-02-10 ENCOUNTER — Emergency Department (HOSPITAL_COMMUNITY): Payer: Self-pay

## 2019-02-10 ENCOUNTER — Other Ambulatory Visit: Payer: Self-pay

## 2019-02-10 DIAGNOSIS — F17210 Nicotine dependence, cigarettes, uncomplicated: Secondary | ICD-10-CM | POA: Insufficient documentation

## 2019-02-10 DIAGNOSIS — E1141 Type 2 diabetes mellitus with diabetic mononeuropathy: Secondary | ICD-10-CM | POA: Insufficient documentation

## 2019-02-10 DIAGNOSIS — Z79899 Other long term (current) drug therapy: Secondary | ICD-10-CM | POA: Insufficient documentation

## 2019-02-10 DIAGNOSIS — I10 Essential (primary) hypertension: Secondary | ICD-10-CM | POA: Insufficient documentation

## 2019-02-10 DIAGNOSIS — L02611 Cutaneous abscess of right foot: Secondary | ICD-10-CM | POA: Insufficient documentation

## 2019-02-10 DIAGNOSIS — Z794 Long term (current) use of insulin: Secondary | ICD-10-CM | POA: Insufficient documentation

## 2019-02-10 DIAGNOSIS — L03115 Cellulitis of right lower limb: Secondary | ICD-10-CM | POA: Insufficient documentation

## 2019-02-10 DIAGNOSIS — S91301A Unspecified open wound, right foot, initial encounter: Secondary | ICD-10-CM

## 2019-02-10 LAB — CBC WITH DIFFERENTIAL/PLATELET
Abs Immature Granulocytes: 0.03 10*3/uL (ref 0.00–0.07)
Basophils Absolute: 0.1 10*3/uL (ref 0.0–0.1)
Basophils Relative: 1 %
Eosinophils Absolute: 0.1 10*3/uL (ref 0.0–0.5)
Eosinophils Relative: 1 %
HCT: 45.4 % (ref 39.0–52.0)
Hemoglobin: 15 g/dL (ref 13.0–17.0)
Immature Granulocytes: 0 %
Lymphocytes Relative: 22 %
Lymphs Abs: 2 10*3/uL (ref 0.7–4.0)
MCH: 30.1 pg (ref 26.0–34.0)
MCHC: 33 g/dL (ref 30.0–36.0)
MCV: 91 fL (ref 80.0–100.0)
Monocytes Absolute: 0.8 10*3/uL (ref 0.1–1.0)
Monocytes Relative: 9 %
Neutro Abs: 5.9 10*3/uL (ref 1.7–7.7)
Neutrophils Relative %: 67 %
Platelets: 188 10*3/uL (ref 150–400)
RBC: 4.99 MIL/uL (ref 4.22–5.81)
RDW: 13.4 % (ref 11.5–15.5)
WBC: 8.8 10*3/uL (ref 4.0–10.5)
nRBC: 0 % (ref 0.0–0.2)

## 2019-02-10 LAB — URINALYSIS, ROUTINE W REFLEX MICROSCOPIC
Bacteria, UA: NONE SEEN
Bilirubin Urine: NEGATIVE
Glucose, UA: >=500 mg/dL — AB
Hgb urine dipstick: NEGATIVE
Ketones, ur: 5 mg/dL — AB
Leukocytes,Ua: NEGATIVE
Nitrite: NEGATIVE
Protein, ur: 30 mg/dL — AB
Specific Gravity, Urine: 1.025 (ref 1.005–1.030)
pH: 6 (ref 5.0–8.0)

## 2019-02-10 LAB — COMPREHENSIVE METABOLIC PANEL
ALT: 69 U/L — ABNORMAL HIGH (ref 0–44)
AST: 92 U/L — ABNORMAL HIGH (ref 15–41)
Albumin: 3.9 g/dL (ref 3.5–5.0)
Alkaline Phosphatase: 75 U/L (ref 38–126)
Anion gap: 13 (ref 5–15)
BUN: 5 mg/dL — ABNORMAL LOW (ref 6–20)
CO2: 26 mmol/L (ref 22–32)
Calcium: 8.9 mg/dL (ref 8.9–10.3)
Chloride: 96 mmol/L — ABNORMAL LOW (ref 98–111)
Creatinine, Ser: 0.6 mg/dL — ABNORMAL LOW (ref 0.61–1.24)
GFR calc Af Amer: >60 mL/min (ref >60)
GFR calc non Af Amer: >60 mL/min (ref >60)
Glucose, Bld: 284 mg/dL — ABNORMAL HIGH (ref 70–99)
Potassium: 3.3 mmol/L — ABNORMAL LOW (ref 3.5–5.1)
Sodium: 135 mmol/L (ref 135–145)
Total Bilirubin: 0.7 mg/dL (ref 0.3–1.2)
Total Protein: 7.5 g/dL (ref 6.5–8.1)

## 2019-02-10 LAB — PROTIME-INR
INR: 1.1 (ref 0.8–1.2)
Prothrombin Time: 14.5 seconds (ref 11.4–15.2)

## 2019-02-10 LAB — LACTIC ACID, PLASMA: Lactic Acid, Venous: 1.6 mmol/L (ref 0.5–1.9)

## 2019-02-10 MED ORDER — CLINDAMYCIN PHOSPHATE 600 MG/50ML IV SOLN
600.0000 mg | Freq: Once | INTRAVENOUS | Status: AC
  Administered 2019-02-10: 600 mg via INTRAVENOUS
  Filled 2019-02-10: qty 50

## 2019-02-10 MED ORDER — OXYCODONE-ACETAMINOPHEN 5-325 MG PO TABS
1.0000 | ORAL_TABLET | Freq: Once | ORAL | Status: AC
  Administered 2019-02-10: 1 via ORAL
  Filled 2019-02-10: qty 1

## 2019-02-10 MED ORDER — CLINDAMYCIN HCL 150 MG PO CAPS: 300.0000 mg | ORAL_CAPSULE | Freq: Three times a day (TID) | ORAL | 0 refills | Status: AC

## 2019-02-10 MED ORDER — HYDROCODONE-ACETAMINOPHEN 5-325 MG PO TABS: 1.0000 | ORAL_TABLET | Freq: Four times a day (QID) | ORAL | 0 refills | Status: DC | PRN

## 2019-02-10 MED ORDER — LIDOCAINE HCL (PF) 1 % IJ SOLN
5.0000 mL | Freq: Once | INTRAMUSCULAR | Status: AC
  Administered 2019-02-10: 5 mL via INTRADERMAL
  Filled 2019-02-10: qty 5

## 2019-02-10 MED ORDER — CIPROFLOXACIN IN D5W 400 MG/200ML IV SOLN
400.0000 mg | Freq: Once | INTRAVENOUS | Status: AC
  Administered 2019-02-10: 13:00:00 400 mg via INTRAVENOUS
  Filled 2019-02-10: qty 200

## 2019-02-10 MED ORDER — SODIUM CHLORIDE 0.9 % IV SOLN
INTRAVENOUS | Status: DC
  Administered 2019-02-10: 13:00:00 via INTRAVENOUS

## 2019-02-10 MED ORDER — CIPROFLOXACIN HCL 500 MG PO TABS: 500.0000 mg | ORAL_TABLET | Freq: Two times a day (BID) | ORAL | 0 refills | Status: DC

## 2019-02-10 NOTE — ED Triage Notes (Signed)
PT states wound to R great toe.  Open ulcer with redness and warmth noted.

## 2019-02-10 NOTE — ED Notes (Signed)
"  Patient verbalizes understanding of discharge instructions. Opportunity for questioning and answers were provided.  pt discharged from ED ."  

## 2019-02-10 NOTE — ED Provider Notes (Signed)
[facility]   CSN: [id number] Arrival date & time: 02/10/19  [id number]     History   Chief Complaint Chief Complaint  Patient presents with  . Wound Infection    HPI Chris Smith is a 43 y.o. male.     HPI Patient presents concern of an open wound, swelling, pain in the right leg. Patient has a history of multiple medical issues, including diabetes, which is poorly controlled. However, he was in his usual state of health until yesterday. After initially noticing a small area of whitish skin in the medial aspect of his right great toe, this has subsequently become an open wound, with grossly enlarged area, drainage, swelling through the distal ankle.  No fever, no chills, no nausea, no vomiting. Patient continues to take her medication as directed, has taken no pain medication. The pain is sharp, severe, nonradiating. Past Medical History:  Diagnosis Date  . Acute respiratory failure (HCC) 09/2014  . Amnesia   . Aspiration pneumonia (HCC) 09/2014  . Chronic back pain   . Diabetes mellitus without complication (HCC)   . Encephalopathy 09/2014  . ETOH abuse   . Hypertension   . Ischemic hepatitis unknown  . Rhabdomyolysis unknown  . Sciatica     Patient Active Problem List   Diagnosis Date Noted  . Dehydration   . Nausea with vomiting 09/07/2016  . Hypokalemia 09/07/2016  . Abdominal pain 04/28/2016  . Enteritis 04/28/2016  . Acidosis 04/28/2016  . Prolonged Q-T interval on ECG 04/28/2016  . Hypomagnesemia 04/28/2016  . ETOH abuse 04/28/2016  . Abdominal pain, bilateral upper quadrant 04/28/2016  . Tachycardia 04/28/2016  . Tobacco abuse 04/28/2016  . Obesity 04/28/2016  . Fatty liver 04/28/2016  . Chronic back pain 04/28/2016  . Memory loss 10/25/2014  . Depression 10/25/2014  . Diabetic mononeuropathy associated with diabetes mellitus due to underlying condition (HCC) 10/25/2014  . Amnesia 10/13/2014  .  Hypertension 10/13/2014  . Diabetes mellitus without complication (HCC) 10/13/2014  . Lumbago 10/13/2014    Past Surgical History:  Procedure Laterality Date  . APPENDECTOMY    . BACK SURGERY  2002   Lumbar        Home Medications    Prior to Admission medications   Medication Sig Start Date End Date Taking? Authorizing Provider  acetaminophen (TYLENOL) 500 MG tablet Take 1,000 mg by mouth every 6 (six) hours as needed for mild pain.    [provider]  HYDROcodone-acetaminophen (NORCO) 5-325 MG tablet Take 1-2 tablets by mouth every 4 (four) hours as needed for severe pain. Patient not taking: Reported on 03/18/2017 02/17/17   Pricilla LovelessGoldston, Scott, MD  ibuprofen (ADVIL,MOTRIN) 600 MG tablet Take 1 tablet (600 mg total) by mouth every 8 (eight) hours as needed. Patient not taking: Reported on 03/18/2017 02/17/17   Pricilla LovelessGoldston, Scott, MD  insulin aspart (NOVOLOG) 100 UNIT/ML injection Inject 10 Units into the skin 3 (three) times daily before meals. Pt gives units based on how he is feeling    [provider]  insulin detemir (LEVEMIR) 100 UNIT/ML injection Inject 0.35 mLs (35 Units total) into the skin 2 (two) times daily. Patient taking differently: Inject 5-15 Units into the skin 2 (two) times daily as needed (when not using novolog).  10/13/14   Hoy RegisterNewlin, Enobong, MD  multivitamin (ONE-A-DAY MEN'S) TABS tablet Take 1 tablet by mouth daily. Patient not taking: Reported on 03/18/2017 09/09/16   Vassie LollMadera, Carlos, MD  naproxen (NAPROSYN) 500 MG  tablet Take 1 tablet (500 mg total) by mouth 2 (two) times daily with a meal. 03/18/17   Allyne GeeSanders, Rosezella FloridaLisa M, PA-C  nicotine (NICODERM CQ - DOSED IN MG/24 HOURS) 21 mg/24hr patch Place 1 patch (21 mg total) onto the skin daily. Patient not taking: Reported on 02/08/2017 09/10/16   Vassie LollMadera, Carlos, MD  pantoprazole (PROTONIX) 40 MG tablet Take 1 tablet (40 mg total) by mouth daily at 12 noon. Patient not taking: Reported on 02/08/2017 09/10/16   Vassie LollMadera,  Carlos, MD  penicillin v potassium (VEETID) 500 MG tablet Take 1 tablet (500 mg total) by mouth 4 (four) times daily. 03/18/17   Garlon HatchetSanders, Lisa M, PA-C  potassium chloride SA (K-DUR,KLOR-CON) 20 MEQ tablet Take 2 tablets (40 mEq total) by mouth daily. Patient not taking: Reported on 02/08/2017 09/10/16   Vassie LollMadera, Carlos, MD  prochlorperazine (COMPAZINE) 10 MG tablet Take 1 tablet (10 mg total) by mouth every 6 (six) hours as needed for nausea or vomiting. Patient not taking: Reported on 02/08/2017 09/09/16   Vassie LollMadera, Carlos, MD  sucralfate (CARAFATE) 1 GM/10ML suspension Take 10 mLs (1 g total) by mouth 4 (four) times daily -  with meals and at bedtime. Patient not taking: Reported on 02/08/2017 09/09/16   Vassie LollMadera, Carlos, MD    Family History Family History  Problem Relation Age of Onset  . Cancer Mother        cause of death, ovarian & colon  . Diabetes Father   . Hypertension Father   . Gout Father   . Hyperlipidemia Father     Social History Social History   Tobacco Use  . Smoking status: Current Every Day Smoker    Packs/day: 0.50    Years: 18.00    Pack years: 9.00  . Smokeless tobacco: Never Used  Substance Use Topics  . Alcohol use: Yes    Alcohol/week: 0.0 standard drinks    Comment: 3 OR 4 DAYS PER WEEK  . Drug use: No     Allergies   Patient has no known allergies.   Review of Systems Review of Systems  Constitutional:       Per HPI, otherwise negative  HENT:       Per HPI, otherwise negative  Respiratory:       Per HPI, otherwise negative  Cardiovascular:       Per HPI, otherwise negative  Gastrointestinal: Negative for vomiting.  Endocrine:       Negative aside from HPI  Genitourinary:       Neg aside from HPI   Musculoskeletal:       Per HPI, otherwise negative  Skin: Positive for wound.  Allergic/Immunologic: Positive for immunocompromised state.  Neurological: Negative for syncope.     Physical Exam Updated Vital Signs BP (!) 161/120 (BP Location:  Right Arm)   Pulse (!) 109   Temp 98.2 F (36.8 C) (Oral)   Resp 19   Ht 6\' 2"  (1.88 m)   Wt 122.5 kg   SpO2 96%   BMI 34.67 kg/m   Physical Exam Vitals signs and nursing note reviewed.  Constitutional:      General: He is not in acute distress.    Appearance: He is well-developed.  HENT:     Head: Normocephalic and atraumatic.  Eyes:     Conjunctiva/sclera: Conjunctivae normal.  Cardiovascular:     Rate and Rhythm: Regular rhythm. Tachycardia present.  Pulmonary:     Effort: Pulmonary effort is normal. No respiratory distress.  Breath sounds: No stridor.  Abdominal:     General: There is no distension.  Musculoskeletal:     Right lower leg: Edema present.  Skin:    General: Skin is warm and dry.       Neurological:     Mental Status: He is alert and oriented to person, place, and time.      ED Treatments / Results  Labs (all labs ordered are listed, but only abnormal results are displayed) Labs Reviewed  COMPREHENSIVE METABOLIC PANEL - Abnormal; Notable for the following components:      Result Value   Potassium 3.3 (*)    Chloride 96 (*)    Glucose, Bld 284 (*)    BUN 5 (*)    Creatinine, Ser 0.60 (*)    AST 92 (*)    ALT 69 (*)    All other components within normal limits  URINALYSIS, ROUTINE W REFLEX MICROSCOPIC - Abnormal; Notable for the following components:   Glucose, UA >=500 (*)    Ketones, ur 5 (*)    Protein, ur 30 (*)    All other components within normal limits  CULTURE, BLOOD (ROUTINE X 2)  CULTURE, BLOOD (ROUTINE X 2)  LACTIC ACID, PLASMA  CBC WITH DIFFERENTIAL/PLATELET  PROTIME-INR  LACTIC ACID, PLASMA    Radiology Dg Toe Great Right  Result Date: 02/10/2019 CLINICAL DATA:  Evaluate for osteomyelitis. Open wound. Diabetes. Diabetic neuropathy. EXAM: RIGHT GREAT TOE COMPARISON:  No recent prior. FINDINGS: Soft tissue ulceration noted over the medial aspect of the distal right great toe. No radiopaque foreign body. No underlying  acute bony abnormality. No bony erosions noted. Mild degenerative changes are noted diffusely. IMPRESSION: Soft tissue ulceration noted over the medial aspect of the right great toe. No radiopaque foreign body. No underlying acute bony abnormality. No bony erosions noted. Electronically Signed   By: Maisie Fushomas  Register   On: 02/10/2019 12:43    Procedures .Marland Kitchen.Incision and Drainage  Date/Time: 02/10/2019 1:54 PM Performed by: Gerhard MunchLockwood, Dudley Mages, MD Authorized by: Gerhard MunchLockwood, Arsenia Goracke, MD   Consent:    Consent obtained:  Verbal   Consent given by:  Patient   Risks discussed:  Incomplete drainage, pain and infection   Alternatives discussed:  Alternative treatment Universal protocol:    Procedure explained and questions answered to patient or proxy's satisfaction: yes     Relevant documents present and verified: yes     Imaging studies available: yes     Required blood products, implants, devices, and special equipment available: yes     Site/side marked: yes     Immediately prior to procedure a time out was called: yes     Patient identity confirmed:  Verbally with patient Location:    Type:  Abscess   Size:  3   Location:  Lower extremity   Lower extremity location:  Toe   Toe location:  R big toe Pre-procedure details:    Skin preparation:  Betadine Anesthesia (see MAR for exact dosages):    Anesthesia method:  Local infiltration   Local anesthetic:  Lidocaine 1% w/o epi Procedure type:    Complexity:  Simple Procedure details:    Needle aspiration: no     Incision types:  Single straight   Incision depth:  Subcutaneous   Scalpel blade:  11   Wound management:  Probed and deloculated   Drainage:  Bloody and serosanguinous   Drainage amount:  Scant   Wound treatment:  Wound left open   Packing materials:  None Post-procedure details:    Patient tolerance of procedure:  Tolerated well, no immediate complications   (including critical care time)  Medications Ordered in ED  Medications  lidocaine (PF) (XYLOCAINE) 1 % injection 5 mL (has no administration in time range)  0.9 %  sodium chloride infusion (has no administration in time range)  clindamycin (CLEOCIN) IVPB 600 mg (has no administration in time range)  ciprofloxacin (CIPRO) IVPB 400 mg (has no administration in time range)     Initial Impression / Assessment and Plan / ED Course  I have reviewed the triage vital signs and the nursing notes.  Pertinent labs & imaging results that were available during my care of the patient were reviewed by me and considered in my medical decision making (see chart for details).    With concern for diabetic foot ulcer, progression, the patient was started on antibiotic, IV, fluids, received analgesia, x-ray performed to exclude osteomyelitis.  1:56 PM Patient in no distress. He is receiving antibiotics, without evidence for reaction. He has tolerated incision and drainage of the fluctuant area surrounding his obvious abscess without complication. The wound has been cleaned, dressed, by myself, with application of topical antibiotic.  This adult male presents with open ulceration of his right great toe, surrounding erythema, fluctuance Patient is afebrile, has no leukocytosis, no lactic acidosis, no with diabetes, no evidence for bacteremia, sepsis. Incision and drainage performed to assist with healing, release of pressure.  IV antibiotics started, with transition to oral anticipated. With no distress, preference for outpatient close follow-up, no alarming findings, the patient was discharged after initial interventions performed without complication.  Final Clinical Impressions(s) / ED Diagnoses   Final diagnoses:  Open wound of right foot, initial encounter  Cellulitis of right lower extremity    ED Discharge Orders         Ordered    ciprofloxacin (CIPRO) 500 MG tablet  2 times daily     02/10/19 1426    clindamycin (CLEOCIN) 150 MG capsule  3 times  daily     02/10/19 1426    HYDROcodone-acetaminophen (NORCO/VICODIN) 5-325 MG tablet  Every 6 hours PRN     02/10/19 1426           Carmin Muskrat, MD 02/10/19 1510

## 2019-02-10 NOTE — Discharge Instructions (Addendum)
As discussed, is very important that you monitor your condition, schedule follow-up with our wound care center, and do not hesitate to return here for any concerning changes in your condition.  Otherwise, please obtain your medication, take as directed, keep your wound clean, dry.

## 2019-02-15 LAB — CULTURE, BLOOD (ROUTINE X 2)
Culture: NO GROWTH
Culture: NO GROWTH
Special Requests: ADEQUATE
Special Requests: ADEQUATE

## 2019-02-27 ENCOUNTER — Other Ambulatory Visit: Payer: Self-pay

## 2019-02-27 ENCOUNTER — Emergency Department (HOSPITAL_COMMUNITY)
Admission: EM | Admit: 2019-02-27 | Discharge: 2019-02-27 | Disposition: A | Discharge status: Home / Self Care | Payer: Self-pay | Attending: Emergency Medicine | Admitting: Emergency Medicine

## 2019-02-27 DIAGNOSIS — R591 Generalized enlarged lymph nodes: Secondary | ICD-10-CM | POA: Insufficient documentation

## 2019-02-27 DIAGNOSIS — F17210 Nicotine dependence, cigarettes, uncomplicated: Secondary | ICD-10-CM | POA: Insufficient documentation

## 2019-02-27 DIAGNOSIS — Z20828 Contact with and (suspected) exposure to other viral communicable diseases: Secondary | ICD-10-CM | POA: Insufficient documentation

## 2019-02-27 DIAGNOSIS — J029 Acute pharyngitis, unspecified: Secondary | ICD-10-CM | POA: Insufficient documentation

## 2019-02-27 DIAGNOSIS — I10 Essential (primary) hypertension: Secondary | ICD-10-CM | POA: Insufficient documentation

## 2019-02-27 DIAGNOSIS — E1141 Type 2 diabetes mellitus with diabetic mononeuropathy: Secondary | ICD-10-CM | POA: Insufficient documentation

## 2019-02-27 DIAGNOSIS — Z794 Long term (current) use of insulin: Secondary | ICD-10-CM | POA: Insufficient documentation

## 2019-02-27 LAB — GROUP A STREP BY PCR: Group A Strep by PCR: NOT DETECTED

## 2019-02-27 MED ORDER — DEXAMETHASONE SODIUM PHOSPHATE 10 MG/ML IJ SOLN
10.0000 mg | Freq: Once | INTRAMUSCULAR | Status: AC
  Administered 2019-02-27: 10 mg via INTRAMUSCULAR
  Filled 2019-02-27: qty 1

## 2019-02-27 NOTE — Discharge Instructions (Addendum)
Your strep test was negative. You likely have a viral infection and the infection could be secondary to the coronavirus.  Today you were were tested for the coronavirus.  The results will be available in the next 3 to 5 days.  If the results are positive the hospital will contact you.  If they are negative the hospital would not contact you.  You will need to self quarantine until you are aware of your results.  If they are positive you will need to self quarantine as directed below.  You should be isolated for at least 7 days since the onset of your symptoms AND >72 hours after symptoms resolution (absence of fever without the use of fever reducing medication and improvement in respiratory symptoms), whichever is longer  For your symptoms you may rotate tylenol and motrin. Stay well hydrated.   Please follow up with your primary care provider within 5-7 days for re-evaluation of your symptoms. If you do not have a primary care provider, information for a healthcare clinic has been provided for you to make arrangements for follow up care. Please return to the emergency department for any new or worsening symptoms.

## 2019-02-27 NOTE — ED Triage Notes (Signed)
Patient reports sore throat, lymphedema onset yesterday and worsening today. Denies fevers but endorses chills. R submandibular swelling noted, bilateral tenderness.

## 2019-02-27 NOTE — ED Provider Notes (Signed)
[facility] Provider Note   CSN: [id number] Arrival date & time: 02/27/19  1648     History   Chief Complaint Chief Complaint  Patient presents with  . Sore Throat  . Lymphedema    HPI Chris Smith is a 43 y.o. male.     HPI   43 year old male with a history of aspiration pneumonia, diabetes, EtOH abuse, hypertension, ischemic hepatitis, rhabdo, who presents to the emergency department today complaining of a sore throat.  States that it started yesterday.  Today he developed lymphadenopathy.  He has had some nasal congestion and a rare cough.  He denies any shortness of breath or chest pain.  Denies abdominal pain nausea vomiting diarrhea.  He has had no known fevers at home.  Denies any known coronavirus exposures.  Denies any sick contacts.  Past Medical History:  Diagnosis Date  . Acute respiratory failure (HCC) 09/2014  . Amnesia   . Aspiration pneumonia (HCC) 09/2014  . Chronic back pain   . Diabetes mellitus without complication (HCC)   . Encephalopathy 09/2014  . ETOH abuse   . Hypertension   . Ischemic hepatitis unknown  . Rhabdomyolysis unknown  . Sciatica     Patient Active Problem List   Diagnosis Date Noted  . Dehydration   . Nausea with vomiting 09/07/2016  . Hypokalemia 09/07/2016  . Abdominal pain 04/28/2016  . Enteritis 04/28/2016  . Acidosis 04/28/2016  . Prolonged Q-T interval on ECG 04/28/2016  . Hypomagnesemia 04/28/2016  . ETOH abuse 04/28/2016  . Abdominal pain, bilateral upper quadrant 04/28/2016  . Tachycardia 04/28/2016  . Tobacco abuse 04/28/2016  . Obesity 04/28/2016  . Fatty liver 04/28/2016  . Chronic back pain 04/28/2016  . Memory loss 10/25/2014  . Depression 10/25/2014  . Diabetic mononeuropathy associated with diabetes mellitus due to underlying condition (HCC) 10/25/2014  . Amnesia 10/13/2014  . Hypertension 10/13/2014  . Diabetes mellitus without complication (HCC) 10/13/2014  .  Lumbago 10/13/2014    Past Surgical History:  Procedure Laterality Date  . APPENDECTOMY    . BACK SURGERY  2002   Lumbar        Home Medications    Prior to Admission medications   Medication Sig Start Date End Date Taking? Authorizing Provider  ciprofloxacin (CIPRO) 500 MG tablet Take 1 tablet (500 mg total) by mouth 2 (two) times daily. 02/10/19  Yes Gerhard MunchLockwood, Robert, MD  insulin glargine (LANTUS) 100 UNIT/ML injection Inject 20-80 Units into the skin daily as needed. Sliding Scale   Yes [provider]    Family History Family History  Problem Relation Age of Onset  . Cancer Mother        cause of death, ovarian & colon  . Diabetes Father   . Hypertension Father   . Gout Father   . Hyperlipidemia Father     Social History Social History   Tobacco Use  . Smoking status: Current Every Day Smoker    Packs/day: 0.50    Years: 18.00    Pack years: 9.00  . Smokeless tobacco: Never Used  Substance Use Topics  . Alcohol use: Yes    Alcohol/week: 0.0 standard drinks    Comment: 3 OR 4 DAYS PER WEEK  . Drug use: No     Allergies   Patient has no known allergies.   Review of Systems Review of Systems  Constitutional: Negative for fever.  HENT: Positive for sore throat. Negative for ear pain and trouble swallowing.  Eyes: Negative for visual disturbance.  Respiratory: Positive for cough. Negative for shortness of breath.   Cardiovascular: Negative for chest pain.  Gastrointestinal: Negative for abdominal pain, constipation, diarrhea, nausea and vomiting.  Genitourinary: Negative for dysuria.  Musculoskeletal: Negative for myalgias.  Skin: Negative for color change and rash.  Neurological: Negative for headaches.  All other systems reviewed and are negative.    Physical Exam Updated Vital Signs BP 131/80   Pulse 63   Temp 98.2 F (36.8 C) (Oral)   Resp 18   SpO2 99%   Physical Exam Vitals signs and nursing note reviewed.  Constitutional:       Appearance: He is well-developed.  HENT:     Head: Normocephalic and atraumatic.     Mouth/Throat:     Mouth: Mucous membranes are moist.     Pharynx: Uvula midline. Oropharyngeal exudate and posterior oropharyngeal erythema present.     Tonsils: Tonsillar exudate present. 3+ on the right. 3+ on the left.  Eyes:     Conjunctiva/sclera: Conjunctivae normal.  Neck:     Musculoskeletal: Neck supple.  Cardiovascular:     Rate and Rhythm: Normal rate and regular rhythm.     Heart sounds: No murmur.  Pulmonary:     Effort: Pulmonary effort is normal. No respiratory distress.     Breath sounds: Normal breath sounds.  Abdominal:     Palpations: Abdomen is soft.     Tenderness: There is no abdominal tenderness.  Skin:    General: Skin is warm and dry.  Neurological:     Mental Status: He is alert.      ED Treatments / Results  Labs (all labs ordered are listed, but only abnormal results are displayed) Labs Reviewed  GROUP A STREP BY PCR  NOVEL CORONAVIRUS, NAA (HOSP ORDER, SEND-OUT TO REF LAB; TAT 18-24 HRS)    EKG None  Radiology No results found.  Procedures Procedures (including critical care time)  Medications Ordered in ED Medications  dexamethasone (DECADRON) injection 10 mg (10 mg Intramuscular Given 02/27/19 1853)     Initial Impression / Assessment and Plan / ED Course  I have reviewed the triage vital signs and the nursing notes.  Pertinent labs & imaging results that were available during my care of the patient were reviewed by me and considered in my medical decision making (see chart for details).  Final Clinical Impressions(s) / ED Diagnoses   Final diagnoses:  Pharyngitis, unspecified etiology   Pt with sore throat, and mild URI sxs. Does have tonsillar edema and exudates. Uvula is midline. No evidence of deep space infection. Strep test is negative. He was given im decadron in the ED.  In light of COVID pandemic, patient was tested for this.  He  was advised self quarantine. Discussed that antibiotics are not indicated for viral infections. Pt will be discharged with symptomatic treatment.  Verbalizes understanding and is agreeable with plan. Pt is hemodynamically stable & in NAD prior to dc.  Chris Smith was evaluated in Emergency Department on 02/27/2019 for the symptoms described in the history of present illness. He was evaluated in the context of the global COVID-19 pandemic, which necessitated consideration that the patient might be at risk for infection with the SARS-CoV-2 virus that causes COVID-19. Institutional protocols and algorithms that pertain to the evaluation of patients at risk for COVID-19 are in a state of rapid change based on information released by regulatory bodies including the CDC and federal and state organizations. These  policies and algorithms were followed during the patient's care in the ED.    ED Discharge Orders    None       Bishop Dublin 02/27/19 2009    Lajean Saver, MD 02/28/19 [phone]

## 2019-03-01 LAB — NOVEL CORONAVIRUS, NAA (HOSP ORDER, SEND-OUT TO REF LAB; TAT 18-24 HRS): SARS-CoV-2, NAA: NOT DETECTED

## 2019-03-11 ENCOUNTER — Inpatient Hospital Stay (HOSPITAL_COMMUNITY): Payer: Self-pay

## 2019-03-11 ENCOUNTER — Inpatient Hospital Stay (HOSPITAL_COMMUNITY)
Admission: EM | Admit: 2019-03-11 | Discharge: 2019-03-13 | DRG: 872 | Discharge status: Against Medical Advice | Payer: Self-pay | Attending: Internal Medicine | Admitting: Internal Medicine

## 2019-03-11 ENCOUNTER — Emergency Department (HOSPITAL_COMMUNITY): Payer: Self-pay

## 2019-03-11 ENCOUNTER — Other Ambulatory Visit: Payer: Self-pay

## 2019-03-11 ENCOUNTER — Encounter (HOSPITAL_COMMUNITY): Payer: Self-pay | Admitting: Pharmacy Technician

## 2019-03-11 DIAGNOSIS — Z20828 Contact with and (suspected) exposure to other viral communicable diseases: Secondary | ICD-10-CM | POA: Diagnosis present

## 2019-03-11 DIAGNOSIS — E1141 Type 2 diabetes mellitus with diabetic mononeuropathy: Secondary | ICD-10-CM | POA: Diagnosis present

## 2019-03-11 DIAGNOSIS — L97512 Non-pressure chronic ulcer of other part of right foot with fat layer exposed: Secondary | ICD-10-CM | POA: Diagnosis present

## 2019-03-11 DIAGNOSIS — Z800 Family history of malignant neoplasm of digestive organs: Secondary | ICD-10-CM

## 2019-03-11 DIAGNOSIS — E1140 Type 2 diabetes mellitus with diabetic neuropathy, unspecified: Secondary | ICD-10-CM | POA: Diagnosis present

## 2019-03-11 DIAGNOSIS — G8929 Other chronic pain: Secondary | ICD-10-CM | POA: Diagnosis present

## 2019-03-11 DIAGNOSIS — E1165 Type 2 diabetes mellitus with hyperglycemia: Secondary | ICD-10-CM

## 2019-03-11 DIAGNOSIS — M86171 Other acute osteomyelitis, right ankle and foot: Secondary | ICD-10-CM | POA: Diagnosis present

## 2019-03-11 DIAGNOSIS — Z794 Long term (current) use of insulin: Secondary | ICD-10-CM

## 2019-03-11 DIAGNOSIS — I10 Essential (primary) hypertension: Secondary | ICD-10-CM | POA: Diagnosis present

## 2019-03-11 DIAGNOSIS — Z8249 Family history of ischemic heart disease and other diseases of the circulatory system: Secondary | ICD-10-CM

## 2019-03-11 DIAGNOSIS — F17210 Nicotine dependence, cigarettes, uncomplicated: Secondary | ICD-10-CM | POA: Diagnosis present

## 2019-03-11 DIAGNOSIS — M869 Osteomyelitis, unspecified: Secondary | ICD-10-CM

## 2019-03-11 DIAGNOSIS — M5430 Sciatica, unspecified side: Secondary | ICD-10-CM | POA: Diagnosis present

## 2019-03-11 DIAGNOSIS — R739 Hyperglycemia, unspecified: Secondary | ICD-10-CM

## 2019-03-11 DIAGNOSIS — K760 Fatty (change of) liver, not elsewhere classified: Secondary | ICD-10-CM | POA: Diagnosis present

## 2019-03-11 DIAGNOSIS — E1169 Type 2 diabetes mellitus with other specified complication: Secondary | ICD-10-CM

## 2019-03-11 DIAGNOSIS — Z8349 Family history of other endocrine, nutritional and metabolic diseases: Secondary | ICD-10-CM

## 2019-03-11 DIAGNOSIS — Z8041 Family history of malignant neoplasm of ovary: Secondary | ICD-10-CM

## 2019-03-11 DIAGNOSIS — Z808 Family history of malignant neoplasm of other organs or systems: Secondary | ICD-10-CM

## 2019-03-11 DIAGNOSIS — E11621 Type 2 diabetes mellitus with foot ulcer: Secondary | ICD-10-CM | POA: Diagnosis present

## 2019-03-11 DIAGNOSIS — Z833 Family history of diabetes mellitus: Secondary | ICD-10-CM

## 2019-03-11 DIAGNOSIS — F1010 Alcohol abuse, uncomplicated: Secondary | ICD-10-CM | POA: Diagnosis present

## 2019-03-11 DIAGNOSIS — R7309 Other abnormal glucose: Secondary | ICD-10-CM

## 2019-03-11 DIAGNOSIS — A419 Sepsis, unspecified organism: Principal | ICD-10-CM

## 2019-03-11 LAB — CBC WITH DIFFERENTIAL/PLATELET
Abs Immature Granulocytes: 0.03 10*3/uL (ref 0.00–0.07)
Basophils Absolute: 0 10*3/uL (ref 0.0–0.1)
Basophils Relative: 0 %
Eosinophils Absolute: 0.1 10*3/uL (ref 0.0–0.5)
Eosinophils Relative: 1 %
HCT: 42.8 % (ref 39.0–52.0)
Hemoglobin: 14.4 g/dL (ref 13.0–17.0)
Immature Granulocytes: 0 %
Lymphocytes Relative: 24 %
Lymphs Abs: 3 10*3/uL (ref 0.7–4.0)
MCH: 30.7 pg (ref 26.0–34.0)
MCHC: 33.6 g/dL (ref 30.0–36.0)
MCV: 91.3 fL (ref 80.0–100.0)
Monocytes Absolute: 0.9 10*3/uL (ref 0.1–1.0)
Monocytes Relative: 7 %
Neutro Abs: 8.2 10*3/uL — ABNORMAL HIGH (ref 1.7–7.7)
Neutrophils Relative %: 68 %
Platelets: 231 10*3/uL (ref 150–400)
RBC: 4.69 MIL/uL (ref 4.22–5.81)
RDW: 12.7 % (ref 11.5–15.5)
WBC: 12.3 10*3/uL — ABNORMAL HIGH (ref 4.0–10.5)
nRBC: 0 % (ref 0.0–0.2)

## 2019-03-11 LAB — SARS CORONAVIRUS 2 BY RT PCR (HOSPITAL ORDER, PERFORMED IN ~~LOC~~ HOSPITAL LAB): SARS Coronavirus 2: NEGATIVE

## 2019-03-11 LAB — CBG MONITORING, ED: Glucose-Capillary: 191 mg/dL — ABNORMAL HIGH (ref 70–99)

## 2019-03-11 LAB — PROTIME-INR
INR: 1 (ref 0.8–1.2)
Prothrombin Time: 13.4 seconds (ref 11.4–15.2)

## 2019-03-11 LAB — COMPREHENSIVE METABOLIC PANEL
ALT: 40 U/L (ref 0–44)
AST: 31 U/L (ref 15–41)
Albumin: 3.6 g/dL (ref 3.5–5.0)
Alkaline Phosphatase: 72 U/L (ref 38–126)
Anion gap: 12 (ref 5–15)
BUN: <5 mg/dL — ABNORMAL LOW (ref 6–20)
CO2: 24 mmol/L (ref 22–32)
Calcium: 9 mg/dL (ref 8.9–10.3)
Chloride: 100 mmol/L (ref 98–111)
Creatinine, Ser: 0.61 mg/dL (ref 0.61–1.24)
GFR calc Af Amer: >60 mL/min (ref >60)
GFR calc non Af Amer: >60 mL/min (ref >60)
Glucose, Bld: 343 mg/dL — ABNORMAL HIGH (ref 70–99)
Potassium: 3.6 mmol/L (ref 3.5–5.1)
Sodium: 136 mmol/L (ref 135–145)
Total Bilirubin: 0.5 mg/dL (ref 0.3–1.2)
Total Protein: 7.3 g/dL (ref 6.5–8.1)

## 2019-03-11 LAB — URINALYSIS, ROUTINE W REFLEX MICROSCOPIC
Bacteria, UA: NONE SEEN
Bilirubin Urine: NEGATIVE
Glucose, UA: >=500 mg/dL — AB
Hgb urine dipstick: NEGATIVE
Ketones, ur: NEGATIVE mg/dL
Leukocytes,Ua: NEGATIVE
Nitrite: NEGATIVE
Protein, ur: NEGATIVE mg/dL
Specific Gravity, Urine: 1.024 (ref 1.005–1.030)
pH: 7 (ref 5.0–8.0)

## 2019-03-11 LAB — LACTIC ACID, PLASMA
Lactic Acid, Venous: 2.2 mmol/L (ref 0.5–1.9)
Lactic Acid, Venous: 2.2 mmol/L (ref 0.5–1.9)

## 2019-03-11 LAB — APTT: aPTT: 34 seconds (ref 24–36)

## 2019-03-11 MED ORDER — VANCOMYCIN HCL IN DEXTROSE 1-5 GM/200ML-% IV SOLN: 1000.0000 mg | Freq: Once | INTRAVENOUS | Status: DC

## 2019-03-11 MED ORDER — FOLIC ACID 1 MG PO TABS
1.0000 mg | ORAL_TABLET | Freq: Every day | ORAL | Status: DC
  Administered 2019-03-11 – 2019-03-13 (×3): 1 mg via ORAL
  Filled 2019-03-11 (×3): qty 1

## 2019-03-11 MED ORDER — ACETAMINOPHEN 325 MG PO TABS: 650.0000 mg | ORAL_TABLET | Freq: Four times a day (QID) | ORAL | Status: DC | PRN

## 2019-03-11 MED ORDER — SODIUM CHLORIDE 0.9 % IV SOLN
INTRAVENOUS | Status: AC
  Administered 2019-03-11: via INTRAVENOUS

## 2019-03-11 MED ORDER — NICOTINE 14 MG/24HR TD PT24
14.0000 mg | MEDICATED_PATCH | Freq: Every day | TRANSDERMAL | Status: DC
  Administered 2019-03-11 – 2019-03-13 (×3): 14 mg via TRANSDERMAL
  Filled 2019-03-11 (×3): qty 1

## 2019-03-11 MED ORDER — ADULT MULTIVITAMIN W/MINERALS CH
1.0000 | ORAL_TABLET | Freq: Every day | ORAL | Status: DC
  Administered 2019-03-11 – 2019-03-13 (×3): 1 via ORAL
  Filled 2019-03-11 (×3): qty 1

## 2019-03-11 MED ORDER — VANCOMYCIN HCL 10 G IV SOLR
3000.0000 mg | Freq: Once | INTRAVENOUS | Status: DC
  Filled 2019-03-11: qty 3000

## 2019-03-11 MED ORDER — LORAZEPAM 2 MG/ML IJ SOLN
1.0000 mg | Freq: Four times a day (QID) | INTRAMUSCULAR | Status: DC | PRN
  Administered 2019-03-12: 1 mg via INTRAVENOUS
  Filled 2019-03-11: qty 1

## 2019-03-11 MED ORDER — SODIUM CHLORIDE 0.9 % IV BOLUS
1000.0000 mL | Freq: Once | INTRAVENOUS | Status: AC
  Administered 2019-03-11: 17:00:00 1000 mL via INTRAVENOUS

## 2019-03-11 MED ORDER — VITAMIN B-1 100 MG PO TABS
100.0000 mg | ORAL_TABLET | Freq: Every day | ORAL | Status: DC
  Administered 2019-03-11 – 2019-03-13 (×3): 100 mg via ORAL
  Filled 2019-03-11 (×3): qty 1

## 2019-03-11 MED ORDER — INSULIN ASPART 100 UNIT/ML ~~LOC~~ SOLN
0.0000 [IU] | Freq: Every day | SUBCUTANEOUS | Status: DC
  Administered 2019-03-12: 22:00:00 3 [IU] via SUBCUTANEOUS

## 2019-03-11 MED ORDER — SODIUM CHLORIDE 0.9 % IV SOLN
2.0000 g | INTRAVENOUS | Status: DC
  Administered 2019-03-12 – 2019-03-13 (×2): 2 g via INTRAVENOUS
  Filled 2019-03-11 (×2): qty 2

## 2019-03-11 MED ORDER — VANCOMYCIN HCL 10 G IV SOLR
2500.0000 mg | Freq: Once | INTRAVENOUS | Status: AC
  Administered 2019-03-11: 2500 mg via INTRAVENOUS
  Filled 2019-03-11: qty 2500

## 2019-03-11 MED ORDER — SODIUM CHLORIDE 0.9 % IV SOLN
2.0000 g | Freq: Once | INTRAVENOUS | Status: AC
  Administered 2019-03-11: 2 g via INTRAVENOUS
  Filled 2019-03-11: qty 20

## 2019-03-11 MED ORDER — VANCOMYCIN HCL 10 G IV SOLR
1750.0000 mg | Freq: Two times a day (BID) | INTRAVENOUS | Status: DC
  Administered 2019-03-12 – 2019-03-13 (×4): 1750 mg via INTRAVENOUS
  Filled 2019-03-11 (×5): qty 1750

## 2019-03-11 MED ORDER — INSULIN ASPART 100 UNIT/ML ~~LOC~~ SOLN
0.0000 [IU] | Freq: Three times a day (TID) | SUBCUTANEOUS | Status: DC
  Administered 2019-03-12 (×2): 3 [IU] via SUBCUTANEOUS
  Administered 2019-03-12: 09:00:00 2 [IU] via SUBCUTANEOUS
  Administered 2019-03-13: 7 [IU] via SUBCUTANEOUS
  Administered 2019-03-13: 3 [IU] via SUBCUTANEOUS
  Administered 2019-03-13: 08:00:00 2 [IU] via SUBCUTANEOUS

## 2019-03-11 MED ORDER — LORAZEPAM 2 MG/ML IJ SOLN
0.5000 mg | Freq: Once | INTRAMUSCULAR | Status: AC
  Administered 2019-03-11: 0.5 mg via INTRAVENOUS
  Filled 2019-03-11: qty 1

## 2019-03-11 MED ORDER — SODIUM CHLORIDE 0.9% FLUSH: 3.0000 mL | Freq: Once | INTRAVENOUS | Status: DC

## 2019-03-11 MED ORDER — MORPHINE SULFATE (PF) 4 MG/ML IV SOLN
4.0000 mg | Freq: Once | INTRAVENOUS | Status: AC
  Administered 2019-03-11: 20:00:00 4 mg via INTRAVENOUS
  Filled 2019-03-11: qty 1

## 2019-03-11 MED ORDER — LORAZEPAM 1 MG PO TABS
1.0000 mg | ORAL_TABLET | Freq: Four times a day (QID) | ORAL | Status: DC | PRN
  Administered 2019-03-11 – 2019-03-12 (×3): 1 mg via ORAL
  Filled 2019-03-11 (×3): qty 1

## 2019-03-11 MED ORDER — SODIUM CHLORIDE 0.9 % IV BOLUS
1000.0000 mL | Freq: Once | INTRAVENOUS | Status: AC
  Administered 2019-03-11: 20:00:00 1000 mL via INTRAVENOUS

## 2019-03-11 MED ORDER — MORPHINE SULFATE (PF) 4 MG/ML IV SOLN
4.0000 mg | Freq: Once | INTRAVENOUS | Status: AC
  Administered 2019-03-11: 4 mg via INTRAVENOUS
  Filled 2019-03-11: qty 1

## 2019-03-11 MED ORDER — ACETAMINOPHEN 650 MG RE SUPP: 650.0000 mg | Freq: Four times a day (QID) | RECTAL | Status: DC | PRN

## 2019-03-11 MED ORDER — MORPHINE SULFATE (PF) 2 MG/ML IV SOLN
1.0000 mg | INTRAVENOUS | Status: DC | PRN
  Administered 2019-03-11 – 2019-03-13 (×10): 1 mg via INTRAVENOUS
  Filled 2019-03-11 (×11): qty 1

## 2019-03-11 MED ORDER — THIAMINE HCL 100 MG/ML IJ SOLN: 100.0000 mg | Freq: Every day | INTRAMUSCULAR | Status: DC

## 2019-03-11 NOTE — ED Triage Notes (Signed)
Pt here for abcess on R toe. States seen here last week for same and was given steroid and no antibiotic. States now he is having fevers.

## 2019-03-11 NOTE — Progress Notes (Addendum)
  Pharmacy Antibiotic Note  Godwin Tedesco is a 43 y.o. male admitted on 03/11/2019 with sepsis - right toe abscess. Afebrile, WBC 12.3. Pharmacy has been consulted for vancomycin dosing.  Plan: Vancomycin 2500 mg IV once Vancomycin 1750 mg IV q12h (estAUC 483, goal AUC 400-550, Scr used 0.8) Follow-up ceftriaxone continuation or gram negative coverage Monitor renal function Monitor C/s  Height: 6\' 2"  (188 cm) Weight: 265 lb (120.2 kg) IBW/kg (Calculated) : 82.2  Temp (24hrs), Avg:99.2 F (37.3 C), Min:99.2 F (37.3 C), Max:99.2 F (37.3 C)  Recent Labs  Lab 03/11/19 1527  WBC 12.3*  CREATININE 0.61  LATICACIDVEN 2.2*    Estimated Creatinine Clearance: 165.7 mL/min (by C-G formula based on SCr of 0.61 mg/dL).    No Known Allergies  Antimicrobials this admission: Ceftriaxone 9/8 >> Vancomycin 9/8 >>   Microbiology results: 9/8 BCx: pending    Thank you for allowing pharmacy to be a part of this patient's care.  Lorel Monaco, PharmD PGY1 Ambulatory Care Resident [employer] # [phone]

## 2019-03-11 NOTE — ED Notes (Signed)
Patient transported to MRI 

## 2019-03-11 NOTE — ED Provider Notes (Addendum)
[facility] EMERGENCY DEPARTMENT Provider Note   CSN: [id number] Arrival date & time: 03/11/19  1517   History   Chief Complaint Wound, fever  HPI Chris Smith is a 43 y.o. male with past medical history significant for chronic back pain, diabetes, alcohol abuse, hypertension, ischemic hepatitis, prolonged QT interval presents for evaluation of wound.  Patient states he has had a wound to the plantar surface of his right great toe times weeks.  Patient noticed over the last several days he has had a fever however has not taken his temperature.  States he has diabetic neuropathy and cannot feel sensation to the bottom of his feet.  Patient states the wound has been worsening however has not been draining or bleeding.  Denies prior history of osteomyelitis.  Denies headache, nausea, vomiting, chest pain, shortness of breath, abdominal pain, diarrhea, dysuria, decreased range of motion, numbness or tingling to his extremities.  He has been ambulatory without difficulty.  Denies additional aggravating or alleviating factors.  Was seen in ED approximately 1 month ago and received IV antibiotics and home antibiotics for 10 days.  Patient states wound has not improved.  He has been trying bacitracin on it without resolve.  PCPDeboraha Sprang MD  History obtained from patient and past medical records.  No interpreter was used.     HPI  Past Medical History:  Diagnosis Date   Acute respiratory failure (HCC) 09/2014   Amnesia    Aspiration pneumonia (HCC) 09/2014   Chronic back pain    Diabetes mellitus without complication (HCC)    Encephalopathy 09/2014   ETOH abuse    Hypertension    Ischemic hepatitis unknown   Rhabdomyolysis unknown   Sciatica     Patient Active Problem List   Diagnosis Date Noted   Diabetic ulcer of right great toe (HCC) 03/11/2019   Dehydration    Nausea with vomiting 09/07/2016   Hypokalemia 09/07/2016   Abdominal pain 04/28/2016    Enteritis 04/28/2016   Acidosis 04/28/2016   Prolonged Q-T interval on ECG 04/28/2016   Hypomagnesemia 04/28/2016   ETOH abuse 04/28/2016   Abdominal pain, bilateral upper quadrant 04/28/2016   Tachycardia 04/28/2016   Tobacco abuse 04/28/2016   Obesity 04/28/2016   Fatty liver 04/28/2016   Chronic back pain 04/28/2016   Memory loss 10/25/2014   Depression 10/25/2014   Diabetic mononeuropathy associated with diabetes mellitus due to underlying condition (HCC) 10/25/2014   Amnesia 10/13/2014   Hypertension 10/13/2014   Diabetes mellitus without complication (HCC) 10/13/2014   Lumbago 10/13/2014    Past Surgical History:  Procedure Laterality Date   APPENDECTOMY     BACK SURGERY  2002   Lumbar        Home Medications    Prior to Admission medications   Medication Sig Start Date End Date Taking? Authorizing Provider  ibuprofen (ADVIL) 200 MG tablet Take 400 mg by mouth every 6 (six) hours as needed for headache or mild pain.   Yes [provider]  insulin glargine (LANTUS) 100 UNIT/ML injection Inject 20-75 Units into the skin See admin instructions. Inject 20-75 units into the skin one to two times a day, per sliding scale   Yes [provider]  ciprofloxacin (CIPRO) 500 MG tablet Take 1 tablet (500 mg total) by mouth 2 (two) times daily. Patient not taking: Reported on 03/11/2019 02/10/19   Gerhard Munch, MD    Family History Family History  Problem Relation Age of Onset   Cancer  Mother        cause of death, ovarian & colon   Diabetes Father    Hypertension Father    Gout Father    Hyperlipidemia Father     Social History Social History   Tobacco Use   Smoking status: Current Every Day Smoker    Packs/day: 0.50    Years: 18.00    Pack years: 9.00   Smokeless tobacco: Never Used  Substance Use Topics   Alcohol use: Yes    Alcohol/week: 0.0 standard drinks    Comment: 3 OR 4 DAYS PER WEEK   Drug use: No      Allergies   Patient has no known allergies.   Review of Systems Review of Systems  Constitutional: Positive for chills and fever (Subjective).  HENT: Negative.   Respiratory: Negative.   Cardiovascular: Negative.   Gastrointestinal: Negative.   Genitourinary: Negative.   Musculoskeletal: Negative for gait problem.  Skin: Positive for wound.  Neurological: Negative.   All other systems reviewed and are negative.   Physical Exam Updated Vital Signs BP (!) 163/111    Pulse (!) 113    Temp 99.2 F (37.3 C) (Oral)    Resp 18    Ht 6\' 2"  (1.88 m)    Wt 120.2 kg    SpO2 96%    BMI 34.02 kg/m   Physical Exam Vitals signs and nursing note reviewed.  Constitutional:      General: He is not in acute distress.    Appearance: He is well-developed. He is not toxic-appearing or diaphoretic.  HENT:     Head: Normocephalic and atraumatic.     Nose: Nose normal.     Mouth/Throat:     Mouth: Mucous membranes are moist.     Pharynx: Oropharynx is clear.  Eyes:     Pupils: Pupils are equal, round, and reactive to light.  Neck:     Musculoskeletal: Normal range of motion and neck supple.  Cardiovascular:     Rate and Rhythm: Normal rate and regular rhythm.     Pulses: Normal pulses.          Dorsalis pedis pulses are 2+ on the right side and 2+ on the left side.       Posterior tibial pulses are 2+ on the right side and 2+ on the left side.     Heart sounds: Normal heart sounds.  Pulmonary:     Effort: Pulmonary effort is normal. No respiratory distress.     Breath sounds: Normal breath sounds. No stridor. No wheezing, rhonchi or rales.  Abdominal:     General: Bowel sounds are normal. There is no distension.     Palpations: Abdomen is soft.     Tenderness: There is no abdominal tenderness. There is no guarding or rebound.  Musculoskeletal: Normal range of motion.     Comments: Moves all 4 extremities without difficulty.  Homans negative.  Skin:    General: Skin is warm and  dry.     Capillary Refill: Capillary refill takes less than 2 seconds.     Comments: 2 cm area of rounded ulceration with exposed fatty tissue  Neurological:     General: No focal deficit present.     Mental Status: He is alert.     Comments: Ambulatory without difficulty.  Unable to feel sensation to bilateral plantar surface of feet at baseline.       ED Treatments / Results  Labs (all labs ordered are listed,  but only abnormal results are displayed) Labs Reviewed  LACTIC ACID, PLASMA - Abnormal; Notable for the following components:      Result Value   Lactic Acid, Venous 2.2 (*)    All other components within normal limits  LACTIC ACID, PLASMA - Abnormal; Notable for the following components:   Lactic Acid, Venous 2.2 (*)    All other components within normal limits  COMPREHENSIVE METABOLIC PANEL - Abnormal; Notable for the following components:   Glucose, Bld 343 (*)    BUN <5 (*)    All other components within normal limits  CBC WITH DIFFERENTIAL/PLATELET - Abnormal; Notable for the following components:   WBC 12.3 (*)    Neutro Abs 8.2 (*)    All other components within normal limits  URINALYSIS, ROUTINE W REFLEX MICROSCOPIC - Abnormal; Notable for the following components:   Color, Urine STRAW (*)    Glucose, UA >=500 (*)    All other components within normal limits  CULTURE, BLOOD (ROUTINE X 2)  CULTURE, BLOOD (ROUTINE X 2)  URINE CULTURE  SARS CORONAVIRUS 2 (HOSPITAL ORDER, [facility])  APTT  PROTIME-INR    EKG EKG Interpretation  Date/Time:  Tuesday March 11 2019 16:57:17 EDT Ventricular Rate:  102 PR Interval:    QRS Duration: 85 QT Interval:  344 QTC Calculation: 446 R Axis:   68 Text Interpretation:  Sinus tachycardia Biatrial enlargement Abnormal R-wave progression, early transition Borderline ST depression, inferior leads background noise Otherwise no significant change Confirmed by Deno Etienne [phone]) on 03/11/2019  7:40:04 PM   Radiology Dg Chest Port 1 View  Result Date: 03/11/2019 CLINICAL DATA:  Fever EXAM: PORTABLE CHEST 1 VIEW COMPARISON:  September 06, 2016 FINDINGS: Lungs are clear. Heart is upper normal in size with pulmonary vascularity normal. No adenopathy. No bone lesions. IMPRESSION: No edema or consolidation.  Heart upper normal in size. Electronically Signed   By: Lowella Grip III M.D.   On: 03/11/2019 16:55   Dg Foot Complete Right  Result Date: 03/11/2019 CLINICAL DATA:  Right great toe ulceration EXAM: RIGHT FOOT COMPLETE - 3+ VIEW COMPARISON:  02/10/2019 FINDINGS: Soft tissue ulceration underlying the medial aspect of the right great toe along the plantar surface. No evidence of periostitis or cortical destruction to suggest acute osteomyelitis. No fracture or malalignment. Tiny plantar calcaneal spur. No radiopaque foreign body. IMPRESSION: 1. No radiographic evidence of acute osteomyelitis. 2. Soft tissue ulceration at the plantar-medial aspect of the right great toe. Electronically Signed   By: Davina Poke M.D.   On: 03/11/2019 17:25    Procedures Procedures (including critical care time)  Medications Ordered in ED Medications  sodium chloride flush (NS) 0.9 % injection 3 mL (has no administration in time range)  vancomycin (VANCOCIN) 2,500 mg in sodium chloride 0.9 % 500 mL IVPB (2,500 mg Intravenous New Bag/Given 03/11/19 1848)  vancomycin (VANCOCIN) 1,750 mg in sodium chloride 0.9 % 500 mL IVPB (has no administration in time range)  cefTRIAXone (ROCEPHIN) 2 g in sodium chloride 0.9 % 100 mL IVPB (0 g Intravenous Stopped 03/11/19 1802)  sodium chloride 0.9 % bolus 1,000 mL (0 mLs Intravenous Stopped 03/11/19 1933)  morphine 4 MG/ML injection 4 mg (4 mg Intravenous Given 03/11/19 1758)  LORazepam (ATIVAN) injection 0.5 mg (0.5 mg Intravenous Given 03/11/19 1758)   Initial Impression / Assessment and Plan / ED Course  I have reviewed the triage vital signs and the nursing  notes.  Pertinent labs &  imaging results that were available during my care of the patient were reviewed by me and considered in my medical decision making (see chart for details).  43 year old male appears otherwise well presents for evaluation of fever and wound to right lower extremity.  Afebrile, nonseptic appearing.  A 2 cm rounded ulceration to right plantar surface of right lower extremity.  He has mild surrounding erythema.  Patient tachycardic to 113, initially evaluation.  Heart and lungs clear.  Has been febrile at home.  Labs obtained from triage. Concern for osteomyelitis given hx DM.  Prior medical records reviewed  CBC with leukocytosis at 12.3 Lactic acid 2.2 Metabolic panel with hyperglycemia at 343 Urinalysis without infection however with greater than 500 glucose. Plain film chest without infiltrates, cardiomegaly, pulmonary edema, pneumothorax Plain film right foot without evidence of osteomyelitis however there is ulceration to medial aspect of his right great toe EKG sinus tach. No ST/T changes. COVID pending on admission  Blood cultures obtained Patient given antibiotics for possible soft tissue infection.  Seen in ED month ago for similar symptoms. Given Cipro, Clinda IV and abx outpatient x 10 days with abscess drainage in ED. Has not followed up outpatient for this.  Symptoms have not improved.  Failed outpatient therapy, no abscess to drain in ED, no evidence of osteomyelitis on x-ray do feel given leukocytosis, tachycardia, elevated lactic acid patient would benefit from IV antibiotics.  May also possibly need MRI to assess for an early osteomyelitis given appearance and history of diabetes.  Patient has had tachycardia however denies chest pain, shortness of breath.  Low suspicion for ACS, PE, dissection as cause of his tachycardia. No evidence of DVT on exam.  1920: Consulted with Dr. Loney Lohathore with [facility] who will evaluate patient for admission. Patient given IV Abx,  IVF per sepsis order set. No evidence of UR sx, low suspicion for COVID as cause of tachycardia.  Derry LoryMark Othman was evaluated in Emergency Department on 03/11/2019 for the symptoms described in the history of present illness. He was evaluated in the context of the global COVID-19 pandemic, which necessitated consideration that the patient might be at risk for infection with the SARS-CoV-2 virus that causes COVID-19. Institutional protocols and algorithms that pertain to the evaluation of patients at risk for COVID-19 are in a state of rapid change based on information released by regulatory bodies including the CDC and federal and state organizations. These policies and algorithms were followed during the patient's care in the ED.   The patient appears reasonably stabilized for admission considering the current resources, flow, and capabilities available in the ED at this time, and I doubt any [facility] further screening and/or treatment in the ED prior to admission.    Final Clinical Impressions(s) / ED Diagnoses   Final diagnoses:  Sepsis, due to unspecified organism, unspecified whether acute organ dysfunction present (HCC)  Skin ulcer of toe of right foot with fat layer exposed (HCC)  Elevated glucose    ED Discharge Orders    None       [place] A, PA-C 03/11/19 1941    Amaurie Wandel A, PA-C 03/11/19 1943    Melene PlanFloyd, Dan, DO 03/11/19 2000

## 2019-03-11 NOTE — H&P (Signed)
History and Physical    Chris LoryMark Shontz ZOX:[id number] DOB: 08/22/1975 DOA: 03/11/2019  PCP: Clayborn Heronankins, Victoria R, MD Patient coming from: Home  Chief Complaint: Right great toe wound  HPI: Chris Smith is a 43 y.o. male with medical history significant of chronic back pain, sciatica, type 2 diabetes, hypertension, alcohol use, tobacco use presenting to the hospital for evaluation of right great toe wound.  Patient states over a month ago he noticed a tiny blood blister at the bottom of his right great toe.  He used nail clippers to pop the blister.  Since then he has had increasing pain and swelling in that area.  States he was seen in the emergency room about a month ago and was given a shot of a steroid and sent home.  States he was not sent home on any antibiotics.  He last saw his primary care physician 4 years ago but since then has not had a follow-up due to lack of medical insurance.  He is currently checking his blood sugars as needed and using his father's diabetes medications given to him by his stepmother.  Reports having fevers and chills at home.  Patient was very upset and stated he does not want to lose his toe. No additional history could be obtained from him.  Patient was seen for similar complaints in the ED on 02/10/2019 and underwent I&D for an abscess underlying the right great toe ulcer.  He was treated with IV clindamycin and ciprofloxacin in the ED.  Discharged on an outpatient course of antibiotics.  ED Course: Afebrile, tachycardic.  Blood pressure elevated.  White count 12.3.  Lactic acid 2.2 >2.2.  Blood glucose 343.  Bicarb 24, anion gap 12.  Creatinine 0.6, at baseline.  Blood culture x2 pending.  UA not suggestive of infection.  SARS-CoV-2 test pending.  Chest x-ray showing no edema or consolidation, heart upper normal in size.  X-ray of right foot without evidence of acute osteomyelitis.  Showing soft tissue ulceration of the plantar medial aspect of the right great toe. Patient  was started on vancomycin and ceftriaxone.  Received 2 L fluid bolus.  Review of Systems:  All systems reviewed and apart from history of presenting illness, are negative.  Past Medical History:  Diagnosis Date  . Acute respiratory failure (HCC) 09/2014  . Amnesia   . Aspiration pneumonia (HCC) 09/2014  . Chronic back pain   . Diabetes mellitus without complication (HCC)   . Encephalopathy 09/2014  . ETOH abuse   . Hypertension   . Ischemic hepatitis unknown  . Rhabdomyolysis unknown  . Sciatica     Past Surgical History:  Procedure Laterality Date  . APPENDECTOMY    . BACK SURGERY  2002   Lumbar     reports that he has been smoking. He has a 9.00 pack-year smoking history. He has never used smokeless tobacco. He reports current alcohol use. He reports that he does not use drugs.  No Known Allergies  Family History  Problem Relation Age of Onset  . Cancer Mother        cause of death, ovarian & colon  . Diabetes Father   . Hypertension Father   . Gout Father   . Hyperlipidemia Father     Prior to Admission medications   Medication Sig Start Date End Date Taking? Authorizing Provider  ibuprofen (ADVIL) 200 MG tablet Take 400 mg by mouth every 6 (six) hours as needed for headache or mild pain.   Yes  [provider]  insulin glargine (LANTUS) 100 UNIT/ML injection Inject 20-80 Units into the skin See admin instructions. Inject 20-75 units into the skin   Yes [provider]  ciprofloxacin (CIPRO) 500 MG tablet Take 1 tablet (500 mg total) by mouth 2 (two) times daily. Patient not taking: Reported on 03/11/2019 02/10/19   Carmin Muskrat, MD    Physical Exam: Vitals:   03/11/19 1520 03/11/19 1521 03/11/19 2325  BP: (!) 163/111  (!) 147/109  Pulse: (!) 113  84  Resp: 18  18  Temp: 99.2 F (37.3 C)  98 F (36.7 C)  TempSrc: Oral  Oral  SpO2: 96%  100%  Weight:  120.2 kg 110 kg  Height:  6\' 2"  (1.88 m) 6' 2.5" (1.892 m)    Physical Exam   Constitutional: He is oriented to person, place, and time. He appears well-developed and well-nourished. No distress.  HENT:  Head: Normocephalic.  Mouth/Throat: Oropharynx is clear and moist.  Eyes: Right eye exhibits no discharge. Left eye exhibits no discharge.  Neck: Neck supple.  Cardiovascular: Normal rate, regular rhythm and intact distal pulses.  Pulmonary/Chest: Breath sounds normal. No respiratory distress. He has no wheezes. He has no rales.  Abdominal: Soft. Bowel sounds are normal. He exhibits no distension. There is no abdominal tenderness. There is no guarding.  Musculoskeletal:     Comments: Significant edema of the right great toe.  Ulcer with mild surrounding erythema noted at the medial plantar aspect of the right great toe.  No active drainage.  Please see image.  Neurological: He is alert and oriented to person, place, and time.  Skin: Skin is warm and dry. He is not diaphoretic.     Labs on Admission: I have personally reviewed following labs and imaging studies  CBC: Recent Labs  Lab 03/11/19 1527  WBC 12.3*  NEUTROABS 8.2*  HGB 14.4  HCT 42.8  MCV 91.3  PLT 144   Basic Metabolic Panel: Recent Labs  Lab 03/11/19 1527  NA 136  K 3.6  CL 100  CO2 24  GLUCOSE 343*  BUN <5*  CREATININE 0.61  CALCIUM 9.0   GFR: Estimated Creatinine Clearance: 159.9 mL/min (by C-G formula based on SCr of 0.61 mg/dL). Liver Function Tests: Recent Labs  Lab 03/11/19 1527  AST 31  ALT 40  ALKPHOS 72  BILITOT 0.5  PROT 7.3  ALBUMIN 3.6   No results for input(s): LIPASE, AMYLASE in the last 168 hours. No results for input(s): AMMONIA in the last 168 hours. Coagulation Profile: Recent Labs  Lab 03/11/19 1527  INR 1.0   Cardiac Enzymes: No results for input(s): CKTOTAL, CKMB, CKMBINDEX, TROPONINI in the last 168 hours. BNP (last 3 results) No results for input(s): PROBNP in the last 8760 hours. HbA1C: No results for input(s): HGBA1C in the last 72 hours.  CBG: Recent Labs  Lab 03/11/19 2158  GLUCAP 191*   Lipid Profile: No results for input(s): CHOL, HDL, LDLCALC, TRIG, CHOLHDL, LDLDIRECT in the last 72 hours. Thyroid Function Tests: No results for input(s): TSH, T4TOTAL, FREET4, T3FREE, THYROIDAB in the last 72 hours. Anemia Panel: No results for input(s): VITAMINB12, FOLATE, FERRITIN, TIBC, IRON, RETICCTPCT in the last 72 hours. Urine analysis:    Component Value Date/Time   COLORURINE STRAW (A) 03/11/2019 1525   APPEARANCEUR CLEAR 03/11/2019 1525   LABSPEC 1.024 03/11/2019 1525   PHURINE 7.0 03/11/2019 1525   GLUCOSEU >=500 (A) 03/11/2019 1525   HGBUR NEGATIVE 03/11/2019 1525  BILIRUBINUR NEGATIVE 03/11/2019 1525   KETONESUR NEGATIVE 03/11/2019 1525   PROTEINUR NEGATIVE 03/11/2019 1525   NITRITE NEGATIVE 03/11/2019 1525   LEUKOCYTESUR NEGATIVE 03/11/2019 1525    Radiological Exams on Admission: Mr Foot Right Wo Contrast  Result Date: 03/11/2019 CLINICAL DATA:  Osteomyelitis, abscess on the right toe EXAM: MRI OF THE RIGHT FOREFOOT WITHOUT CONTRAST TECHNIQUE: Multiplanar, multisequence MR imaging of the right forefoot was performed. No intravenous contrast was administered. COMPARISON:  Radiograph March 11, 2019 FINDINGS: Bones/Joint/Cartilage There is increased T2 signal seen throughout the distal phalanx of the first digit. There is associated T1 hypointensity seen subtly at the distal tuft of the first digit. No definite cortical destruction periosteal reaction is seen. Mild first MTP joint osteoarthritis is seen with subchondral sclerosis. Ligaments The Lisfranc ligament appears to be intact. Muscles and Tendons There is diffuse fatty atrophy of the muscles surrounding the forefoot. The tendons appear to be intact. Soft tissues Area of skin irregularity in ulceration overlying the lateral aspect of the first digit measuring 9 mm. No sinus tract or loculated fluid collection is seen. There is diffuse subcutaneous edema and skin  thickening. IMPRESSION: 1. Area of ulceration seen overlying the lateral aspect of the first toe with findings suggestive of early osteomyelitis involving the distal tuft. Reactive marrow seen throughout the remainder of the distal phalanx of the first digit. No abscess or sinus tract. Electronically Signed   By: Jonna Clark M.D.   On: 03/11/2019 23:23   Dg Chest Port 1 View  Result Date: 03/11/2019 CLINICAL DATA:  Fever EXAM: PORTABLE CHEST 1 VIEW COMPARISON:  September 06, 2016 FINDINGS: Lungs are clear. Heart is upper normal in size with pulmonary vascularity normal. No adenopathy. No bone lesions. IMPRESSION: No edema or consolidation.  Heart upper normal in size. Electronically Signed   By: Bretta Bang III M.D.   On: 03/11/2019 16:55   Dg Foot Complete Right  Result Date: 03/11/2019 CLINICAL DATA:  Right great toe ulceration EXAM: RIGHT FOOT COMPLETE - 3+ VIEW COMPARISON:  02/10/2019 FINDINGS: Soft tissue ulceration underlying the medial aspect of the right great toe along the plantar surface. No evidence of periostitis or cortical destruction to suggest acute osteomyelitis. No fracture or malalignment. Tiny plantar calcaneal spur. No radiopaque foreign body. IMPRESSION: 1. No radiographic evidence of acute osteomyelitis. 2. Soft tissue ulceration at the plantar-medial aspect of the right great toe. Electronically Signed   By: Duanne Guess M.D.   On: 03/11/2019 17:25    EKG: Independently reviewed.  Sinus tachycardia, heart rate 102.  Baseline wander in several leads.  Assessment/Plan Principal Problem:   Osteomyelitis due to type 2 diabetes mellitus (HCC) Active Problems:   Diabetic ulcer of right great toe (HCC)   Sepsis (HCC)   Hyperglycemia   Poorly controlled diabetes mellitus (HCC)   Sepsis secondary to diabetic right great toe ulcer, early osteomyelitis Afebrile.  White count 12.3.  Initially tachycardic, now resolved.  Lactic acid 2.2 >2.2.  MRI right foot showing area of  ulceration overlying the lateral aspect of the first toe with findings suggestive of early osteomyelitis involving the distal tuft.  Reactive marrow seen throughout the remainder of the distal phalanx of the first digit.  No abscess or sinus tract. -Received 2 L IV fluid boluses in the ED.  Continue IV fluid hydration. -Continue vancomycin and ceftriaxone -Blood culture x2 pending -Consult orthopedics in a.m. -Keep n.p.o. -Morphine PRN pain -Trend lactate -Continue to monitor white count  Hyperglycemia, poorly controlled  insulin-dependent type 2 diabetes mellitus Patient has not seen a primary care physician in 4 years.  He is currently checking his blood glucose at home as needed and taking his father's diabetes medications.  Random blood glucose 343.  Labs not suggestive of DKA. -Sliding scale insulin with bedtime coverage and CBG checks.  Patient will likely need long-acting insulin.  Pending A1c.  Poorly controlled hypertension In the setting of lack of medical care outside the hospital.  Not on any antihypertensives at home.  Systolic in the 140s to 160s and diastolic up to 110s. -We will not start an oral antihypertensive at this time in the setting of sepsis.  Hydralazine PRN SBP greater than 180.  Tobacco use -Counseling -NicoDerm patch  Alcohol abuse Patient reports consuming alcohol 3-4 times a week.  No signs of withdrawal at this time. -CIWA protocol; Ativan PRN -Thiamine, folate, multivitamin  DVT prophylaxis: SCDs Code Status: Full code Family Communication: No family available. Disposition Plan: Anticipate discharge after clinical improvement. Consults called: None Admission status: It is my clinical opinion that admission to INPATIENT is reasonable and necessary in this 43 y.o. male . presenting with symptoms of Sepsis secondary to diabetic right great toe ulcer, early osteomyelitis.  Needs IV antibiotics and evaluation by orthopedic surgery in the morning.  Given  the aforementioned, the predictability of an adverse outcome is felt to be significant. I expect that the patient will require at least 2 midnights in the hospital to treat this condition.   The medical decision making on this patient was of high complexity and the patient is at high risk for clinical deterioration, therefore this is a level 3 visit.  John GiovanniVasundhra Juan Olthoff MD [facility] Pager [phone]- [phone]  If 7PM-7AM, please contact night-coverage [email or web address] Password [email or web address]  03/12/2019, 1:12 AM

## 2019-03-11 NOTE — ED Notes (Signed)
Lactic Acid 2.2

## 2019-03-11 NOTE — ED Notes (Signed)
Gave pt urinal for urine collection and culture.

## 2019-03-12 ENCOUNTER — Encounter (HOSPITAL_COMMUNITY): Payer: Self-pay | Admitting: General Practice

## 2019-03-12 DIAGNOSIS — E1165 Type 2 diabetes mellitus with hyperglycemia: Secondary | ICD-10-CM

## 2019-03-12 DIAGNOSIS — L97519 Non-pressure chronic ulcer of other part of right foot with unspecified severity: Secondary | ICD-10-CM

## 2019-03-12 DIAGNOSIS — E1169 Type 2 diabetes mellitus with other specified complication: Secondary | ICD-10-CM

## 2019-03-12 DIAGNOSIS — R739 Hyperglycemia, unspecified: Secondary | ICD-10-CM

## 2019-03-12 DIAGNOSIS — E11621 Type 2 diabetes mellitus with foot ulcer: Secondary | ICD-10-CM

## 2019-03-12 DIAGNOSIS — A419 Sepsis, unspecified organism: Secondary | ICD-10-CM

## 2019-03-12 LAB — CBC
HCT: 41.1 % (ref 39.0–52.0)
Hemoglobin: 13.6 g/dL (ref 13.0–17.0)
MCH: 30.4 pg (ref 26.0–34.0)
MCHC: 33.1 g/dL (ref 30.0–36.0)
MCV: 91.9 fL (ref 80.0–100.0)
Platelets: 199 10*3/uL (ref 150–400)
RBC: 4.47 MIL/uL (ref 4.22–5.81)
RDW: 12.7 % (ref 11.5–15.5)
WBC: 6.2 10*3/uL (ref 4.0–10.5)
nRBC: 0 % (ref 0.0–0.2)

## 2019-03-12 LAB — GLUCOSE, CAPILLARY
Glucose-Capillary: 196 mg/dL — ABNORMAL HIGH (ref 70–99)
Glucose-Capillary: 194 mg/dL — ABNORMAL HIGH (ref 70–99)
Glucose-Capillary: 232 mg/dL — ABNORMAL HIGH (ref 70–99)
Glucose-Capillary: 214 mg/dL — ABNORMAL HIGH (ref 70–99)
Glucose-Capillary: 262 mg/dL — ABNORMAL HIGH (ref 70–99)

## 2019-03-12 LAB — URINE CULTURE: Culture: NO GROWTH

## 2019-03-12 LAB — LACTIC ACID, PLASMA: Lactic Acid, Venous: 1.6 mmol/L (ref 0.5–1.9)

## 2019-03-12 LAB — HIV ANTIBODY (ROUTINE TESTING W REFLEX): HIV Screen 4th Generation wRfx: NONREACTIVE

## 2019-03-12 MED ORDER — LISINOPRIL 10 MG PO TABS
10.0000 mg | ORAL_TABLET | Freq: Every day | ORAL | Status: DC
  Administered 2019-03-12 – 2019-03-13 (×2): 10 mg via ORAL
  Filled 2019-03-12 (×2): qty 1

## 2019-03-12 MED ORDER — INFLUENZA VAC SPLIT QUAD 0.5 ML IM SUSY
0.5000 mL | PREFILLED_SYRINGE | INTRAMUSCULAR | Status: DC
  Filled 2019-03-12: qty 0.5

## 2019-03-12 MED ORDER — SODIUM CHLORIDE 0.9 % IV SOLN
INTRAVENOUS | Status: DC | PRN
  Administered 2019-03-12: 1000 mL via INTRAVENOUS

## 2019-03-12 MED ORDER — AMLODIPINE BESYLATE 5 MG PO TABS
5.0000 mg | ORAL_TABLET | Freq: Every day | ORAL | Status: DC
  Administered 2019-03-12 – 2019-03-13 (×2): 5 mg via ORAL
  Filled 2019-03-12 (×2): qty 1

## 2019-03-12 MED ORDER — HYDROCODONE-ACETAMINOPHEN 10-325 MG PO TABS
1.0000 | ORAL_TABLET | Freq: Four times a day (QID) | ORAL | Status: DC | PRN
  Administered 2019-03-12 – 2019-03-13 (×4): 1 via ORAL
  Filled 2019-03-12 (×4): qty 1

## 2019-03-12 MED ORDER — HYDRALAZINE HCL 20 MG/ML IJ SOLN: 5.0000 mg | INTRAMUSCULAR | Status: DC | PRN

## 2019-03-12 NOTE — Consult Note (Addendum)
ORTHOPAEDIC CONSULTATION  REQUESTING PHYSICIAN: Elgergawy, Silver Huguenin, MD  Chief Complaint: Right great toe ulcer  HPI: Chris Smith is a 43 y.o. male who presents with right great toe ulcer to the ED last night.  He noticed a blood blister a month ago and popped it. Since then he's had increasing pain and swelling in the area.  Recently had it debrided in the ED and discharged on po abx.  He has h/o diabetes, HTN, alcohol use, tobacco use.    Past Medical History:  Diagnosis Date  . Acute respiratory failure ([place]) 09/2014  . Amnesia   . Aspiration pneumonia ([place]) 09/2014  . Chronic back pain   . Diabetes mellitus without complication ([place])   . Encephalopathy 09/2014  . ETOH abuse   . Hypertension   . Ischemic hepatitis unknown  . Rhabdomyolysis unknown  . Sciatica    Past Surgical History:  Procedure Laterality Date  . APPENDECTOMY    . BACK SURGERY  2002   Lumbar   Social History   Socioeconomic History  . Marital status: Single    Spouse name: Not on file  . Number of children: 2  . Years of education: Not on file  . Highest education level: Not on file  Occupational History  . Occupation: Disability  Social Needs  . Financial resource strain: Not on file  . Food insecurity    Worry: Not on file    Inability: Not on file  . Transportation needs    Medical: Not on file    Non-medical: Not on file  Tobacco Use  . Smoking status: Current Every Day Smoker    Packs/day: 0.50    Years: 18.00    Pack years: 9.00    Types: Cigarettes  . Smokeless tobacco: Never Used  Substance and Sexual Activity  . Alcohol use: Yes    Alcohol/week: 0.0 standard drinks    Comment: 3 OR 4 DAYS PER WEEK  . Drug use: No  . Sexual activity: Not on file  Lifestyle  . Physical activity    Days per week: Not on file    Minutes per session: Not on file  . Stress: Not on file  Relationships  . Social Herbalist on phone: Not on file    Gets together: Not on file   Attends religious service: Not on file    Active member of club or organization: Not on file    Attends meetings of clubs or organizations: Not on file    Relationship status: Not on file  Other Topics Concern  . Not on file  Social History Narrative  . Not on file   Family History  Problem Relation Age of Onset  . Cancer Mother        cause of death, ovarian & colon  . Diabetes Father   . Hypertension Father   . Gout Father   . Hyperlipidemia Father    - negative except otherwise stated in the family history section No Known Allergies Prior to Admission medications   Medication Sig Start Date End Date Taking? Authorizing Provider  ibuprofen (ADVIL) 200 MG tablet Take 400 mg by mouth every 6 (six) hours as needed for headache or mild pain.   Yes [provider]  insulin glargine (LANTUS) 100 UNIT/ML injection Inject 20-75 Units into the skin See admin instructions. Inject 20-75 units into the skin one to two times a day, per sliding scale   Yes [provider]  ciprofloxacin (CIPRO) 500 MG tablet Take 1 tablet (500 mg total) by mouth 2 (two) times daily. Patient not taking: Reported on 03/11/2019 02/10/19   Gerhard Munch, MD   Mr Foot Right Wo Contrast  Result Date: 03/11/2019 CLINICAL DATA:  Osteomyelitis, abscess on the right toe EXAM: MRI OF THE RIGHT FOREFOOT WITHOUT CONTRAST TECHNIQUE: Multiplanar, multisequence MR imaging of the right forefoot was performed. No intravenous contrast was administered. COMPARISON:  Radiograph March 11, 2019 FINDINGS: Bones/Joint/Cartilage There is increased T2 signal seen throughout the distal phalanx of the first digit. There is associated T1 hypointensity seen subtly at the distal tuft of the first digit. No definite cortical destruction periosteal reaction is seen. Mild first MTP joint osteoarthritis is seen with subchondral sclerosis. Ligaments The Lisfranc ligament appears to be intact. Muscles and Tendons There is diffuse  fatty atrophy of the muscles surrounding the forefoot. The tendons appear to be intact. Soft tissues Area of skin irregularity in ulceration overlying the lateral aspect of the first digit measuring 9 mm. No sinus tract or loculated fluid collection is seen. There is diffuse subcutaneous edema and skin thickening. IMPRESSION: 1. Area of ulceration seen overlying the lateral aspect of the first toe with findings suggestive of early osteomyelitis involving the distal tuft. Reactive marrow seen throughout the remainder of the distal phalanx of the first digit. No abscess or sinus tract. Electronically Signed   By: Jonna Clark M.D.   On: 03/11/2019 23:23   Dg Chest Port 1 View  Result Date: 03/11/2019 CLINICAL DATA:  Fever EXAM: PORTABLE CHEST 1 VIEW COMPARISON:  September 06, 2016 FINDINGS: Lungs are clear. Heart is upper normal in size with pulmonary vascularity normal. No adenopathy. No bone lesions. IMPRESSION: No edema or consolidation.  Heart upper normal in size. Electronically Signed   By: Bretta Bang III M.D.   On: 03/11/2019 16:55   Dg Foot Complete Right  Result Date: 03/11/2019 CLINICAL DATA:  Right great toe ulceration EXAM: RIGHT FOOT COMPLETE - 3+ VIEW COMPARISON:  02/10/2019 FINDINGS: Soft tissue ulceration underlying the medial aspect of the right great toe along the plantar surface. No evidence of periostitis or cortical destruction to suggest acute osteomyelitis. No fracture or malalignment. Tiny plantar calcaneal spur. No radiopaque foreign body. IMPRESSION: 1. No radiographic evidence of acute osteomyelitis. 2. Soft tissue ulceration at the plantar-medial aspect of the right great toe. Electronically Signed   By: Duanne Guess M.D.   On: 03/11/2019 17:25   - pertinent xrays, CT, MRI studies were reviewed and independently interpreted  Positive ROS: All other systems have been reviewed and were otherwise negative with the exception of those mentioned in the HPI and as above.   Physical Exam: General: Alert, no acute distress Cardiovascular: No pedal edema Respiratory: No cyanosis, no use of accessory musculature GI: No organomegaly, abdomen is soft and non-tender Skin: No lesions in the area of chief complaint Neurologic: Sensation intact distally Psychiatric: Patient is competent for consent with normal mood and affect Lymphatic: No axillary or cervical lymphadenopathy  MUSCULOSKELETAL:      Wound probes down to phalanx There is mild purulent drainage  Assessment: Right great toe ulcer, possible early osteo  Plan: - discussed findings in details with patient and discussed nonoperative vs operative treatments and their associated r/b/a.  He would like to try to save the toe. - recommend ID consultation for recommendations on abx course - hydrotherapy - dial soap soaks BID - follow up with wound center post hospital discharge -  he understands that it may not heal and eventually require toe amputation  Thank you for the consult and the opportunity to see Chris Smith  N. Glee ArvinMichael Aleila Syverson, MD 12:49 PM

## 2019-03-12 NOTE — TOC Initial Note (Signed)
Transition of Care [facility]) - Initial/Assessment Note    Patient Details  Name: Chris Smith MRN: [id number] Date of Birth: 11-25-1975  Transition of Care [facility]) CM/SW Contact:    Marilu Favre, RN Phone Number: 03/12/2019, 11:20 AM  Clinical Narrative:                  Confirmed face sheet information with patient.   Patient does not want ETOH resources at this time.   He does not have a PCP. Discussed [facility] and provided information. Also discussed [facility] and TOC pharmacy. Patient voiced understanding. Will continue to follow for dc needs. Expected Discharge Plan: Home/Self Care Barriers to Discharge: Continued Medical Work up   Patient Goals and CMS Choice Patient states their goals for this hospitalization and ongoing recovery are:: to go home CMS Medicare.gov Compare Post Acute Care list provided to:: Patient Choice offered to / list presented to : Patient  Expected Discharge Plan and Services Expected Discharge Plan: Home/Self Care In-house Referral: Financial Counselor Discharge Planning Services: CM Consult, [facility], [facility], Medication Assistance Post Acute Care Choice: [place] arrangements for the past 2 months: Single Family Home                 DME Arranged: N/A         HH Arranged: NA          Prior Living Arrangements/Services Living arrangements for the past 2 months: Single Family Home Lives with:: Self Patient language and need for interpreter reviewed:: Yes        Need for Family Participation in Patient Care: No (Comment) Care giver support system in place?: Yes (comment)   Criminal Activity/Legal Involvement Pertinent to Current Situation/Hospitalization: No - Comment as needed  Activities of Daily Living Home Assistive Devices/Equipment: CPAP, CBG Meter ADL Screening (condition at time of admission) Patient's cognitive ability adequate to safely complete daily activities?: Yes Is the  patient deaf or have difficulty hearing?: No Does the patient have difficulty seeing, even when wearing glasses/contacts?: No Does the patient have difficulty concentrating, remembering, or making decisions?: No Patient able to express need for assistance with ADLs?: Yes Does the patient have difficulty dressing or bathing?: No Independently performs ADLs?: Yes (appropriate for developmental age) Does the patient have difficulty walking or climbing stairs?: No Weakness of Legs: Right Weakness of Arms/Hands: None  Permission Sought/Granted   Permission granted to share information with : No              Emotional Assessment Appearance:: Appears stated age Attitude/Demeanor/Rapport: Engaged Affect (typically observed): Accepting Orientation: : Oriented to Self, Oriented to Place, Oriented to  Time, Oriented to Situation Alcohol / Substance Use: Alcohol Use Psych Involvement: No (comment)  Admission diagnosis:  Elevated glucose [R73.09] Skin ulcer of toe of right foot with fat layer exposed ([place]) [L97.512] Sepsis, due to unspecified organism, unspecified whether acute organ dysfunction present [facility]) [A41.9] Patient Active Problem List   Diagnosis Date Noted  . Osteomyelitis due to type 2 diabetes mellitus ([place]) 03/12/2019  . Sepsis ([place]) 03/12/2019  . Hyperglycemia 03/12/2019  . Poorly controlled diabetes mellitus ([place]) 03/12/2019  . Diabetic ulcer of right great toe ([place]) 03/11/2019  . Dehydration   . Nausea with vomiting 09/07/2016  . Hypokalemia 09/07/2016  . Abdominal pain 04/28/2016  . Enteritis 04/28/2016  . Acidosis 04/28/2016  . Prolonged Q-T interval on ECG 04/28/2016  . Hypomagnesemia 04/28/2016  . ETOH abuse 04/28/2016  .  Abdominal pain, bilateral upper quadrant 04/28/2016  . Tachycardia 04/28/2016  . Tobacco abuse 04/28/2016  . Obesity 04/28/2016  . Fatty liver 04/28/2016  . Chronic back pain 04/28/2016  . Memory loss 10/25/2014  . Depression 10/25/2014   . Diabetic mononeuropathy associated with diabetes mellitus due to underlying condition (HCC) 10/25/2014  . Amnesia 10/13/2014  . Hypertension 10/13/2014  . Diabetes mellitus without complication (HCC) 10/13/2014  . Lumbago 10/13/2014   PCP:  Clayborn Heronankins, Victoria R, MD Pharmacy:   [facility] [phone] - [place] - [address] [facility] [address] & [address] [place] [address] Phone: [phone] Fax: [phone]  [facility] [phone] [place] - [phone] [place] [id number] Phone: [phone] Fax: [phone]  [facility] - [address] - [address] [place] [address] Phone: [phone] Fax: [phone]  [facility] [place], [place] - [address] [place] [address] Phone: [phone] Fax: [phone]     Social Determinants of Health (SDOH) Interventions    Readmission Risk Interventions No flowsheet data found.

## 2019-03-12 NOTE — Progress Notes (Signed)
Dr. Erlinda Hong notified that the dial antibacterial soap has been ordered from purchasing (outside the hospital).

## 2019-03-12 NOTE — Progress Notes (Signed)
PROGRESS NOTE                                                                                                                                                                                                             Patient Demographics:    Chris Smith, is a 43 y.o. male, DOB - 1976/01/30, NWG:[id number]  Admit date - 03/11/2019   Admitting Physician John Giovanni, MD  Outpatient Primary MD for the patient is Rankins, Fanny Dance, MD  LOS - 1   No chief complaint on file.      Brief Narrative    Chris Smith is a 43 y.o. male with medical history significant of chronic back pain, sciatica, type 2 diabetes, hypertension, alcohol use, tobacco use presenting to the hospital for evaluation of right great toe wound.  Patient states over a month ago he noticed a tiny blood blister at the bottom of his right great toe.  He used nail clippers to pop the blister.  Since then he has had increasing pain and swelling in that area.  States he was seen in the emergency room about a month ago and was given a shot of a steroid and sent home.  States he was not sent home on any antibiotics.  He last saw his primary care physician 4 years ago but since then has not had a follow-up due to lack of medical insurance.  He is currently checking his blood sugars as needed and using his father's diabetes medications given to him by his stepmother.  Reports having fevers and chills at home.  Patient was very upset and stated he does not want to lose his toe. No additional history could be obtained from him.  Patient was seen for similar complaints in the ED on 02/10/2019 and underwent I&D for an abscess underlying the right great toe ulcer.  He was treated with IV clindamycin and ciprofloxacin in the ED.  Discharged on an outpatient course of antibiotics.  ED Course: Afebrile, tachycardic.  Blood pressure elevated.  White count 12.3.  Lactic acid 2.2 >2.2.  Blood glucose 343.   Bicarb 24, anion gap 12.  Creatinine 0.6, at baseline.  Blood culture x2 pending.  UA not suggestive of infection.  SARS-CoV-2 test pending.  Chest x-ray showing no edema or consolidation, heart upper normal in size.  X-ray of  right foot without evidence of acute osteomyelitis.  Showing soft tissue ulceration of the plantar medial aspect of the right great toe. Patient was started on vancomycin and ceftriaxone.  Received 2 L fluid bolus.   Subjective:    Chris LoryMark Smith today has, No headache, No chest pain, No abdominal pain - No Nausea, No new weakness tingling or numbness, No Cough - SOB.   Assessment  & Plan :    Principal Problem:   Osteomyelitis due to type 2 diabetes mellitus (HCC) Active Problems:   Diabetic ulcer of right great toe (HCC)   Sepsis (HCC)   Hyperglycemia   Poorly controlled diabetes mellitus (HCC)   Sepsis secondary to diabetic right great toe ulcer, early osteomyelitis - Afebrile.  White count 12.3.  Initially tachycardic, now resolved.  Lactic acid 2.2 >2.2.  MRI right foot showing area of ulceration overlying the lateral aspect of the first toe with findings suggestive of early osteomyelitis involving the distal tuft.  Reactive marrow seen throughout the remainder of the distal phalanx of the first digit.  No abscess or sinus tract. -Continue with IV antibiotics, will discuss with ID appropriate antibiotic regimen on discharge. -Therapeutic consult greatly appreciated, patient opted for nonoperative management, continue with wound care, hydrotherapy and foot soak -For now continue with broad-spectrum antibiotic including vancomycin and Rocephin  Hyperglycemia, poorly controlled insulin-dependent type 2 diabetes mellitus Patient has not seen a primary care physician in 4 years.  He is currently checking his blood glucose at home as needed and taking his father's diabetes medications.  Random blood glucose 343.  Labs not suggestive of DKA. -Sliding scale insulin with  bedtime coverage and CBG checks.  Patient will likely need long-acting insulin.  Pending A1c.  Poorly controlled hypertension In the setting of lack of medical care outside the hospital.  Not on any antihypertensives at home.  Systolic in the 140s to 160s and diastolic up to 110s.  Blood pressure remains significantly uncontrolled, I will start on lisinopril and Norvasc  .  Tobacco use -Counseling -NicoDerm patch  Alcohol abuse Patient reports consuming alcohol 3-4 times a week.  No signs of withdrawal at this time. -CIWA protocol; Ativan PRN -Thiamine, folate, multivitamin  COVID-19 Labs  No results for input(s): DDIMER, FERRITIN, LDH, CRP in the last 72 hours.  Lab Results  Component Value Date   SARSCOV2NAA NEGATIVE 03/11/2019   SARSCOV2NAA NOT DETECTED 02/27/2019     Code Status : Full  Family Communication  : D/W patient  Disposition Plan  : Home  Barriers For Discharge : Remains on IV antibiotics, will need prolonged course of antibiotics  Consults  :  Orthopedic  Procedures  : None  DVT Prophylaxis  :  [place] lovenox Lab Results  Component Value Date   PLT 199 03/12/2019    Antibiotics  :    Anti-infectives (From admission, onward)   Start     Dose/Rate Route Frequency Ordered Stop   03/12/19 1730  cefTRIAXone (ROCEPHIN) 2 g in sodium chloride 0.9 % 100 mL IVPB     2 g 200 mL/hr over 30 Minutes Intravenous Every 24 hours 03/11/19 2021     03/12/19 0600  vancomycin (VANCOCIN) 1,750 mg in sodium chloride 0.9 % 500 mL IVPB     1,750 mg 250 mL/hr over 120 Minutes Intravenous Every 12 hours 03/11/19 1723     03/11/19 1700  vancomycin (VANCOCIN) 3,000 mg in sodium chloride 0.9 % 500 mL IVPB  Status:  Discontinued  3,000 mg 250 mL/hr over 120 Minutes Intravenous  Once 03/11/19 1649 03/11/19 1652   03/11/19 1700  vancomycin (VANCOCIN) 2,500 mg in sodium chloride 0.9 % 500 mL IVPB     2,500 mg 250 mL/hr over 120 Minutes Intravenous  Once 03/11/19 1652  03/11/19 2154   03/11/19 1645  vancomycin (VANCOCIN) IVPB 1000 mg/200 mL premix  Status:  Discontinued     1,000 mg 200 mL/hr over 60 Minutes Intravenous  Once 03/11/19 1642 03/11/19 1649   03/11/19 1645  cefTRIAXone (ROCEPHIN) 2 g in sodium chloride 0.9 % 100 mL IVPB     2 g 200 mL/hr over 30 Minutes Intravenous  Once 03/11/19 1642 03/11/19 1802        Objective:   Vitals:   03/12/19 0450 03/12/19 0530 03/12/19 0629 03/12/19 1300  BP: (!) 164/104 (!) 160/101 (!) 154/98 (!) 173/97  Pulse: 82   82  Resp: 16   18  Temp: 98.2 F (36.8 C)   97.9 F (36.6 C)  TempSrc: Oral   Oral  SpO2: 96%   99%  Weight:      Height:        Wt Readings from Last 3 Encounters:  03/11/19 110 kg  02/10/19 122.5 kg  03/18/17 117.9 kg     Intake/Output Summary (Last 24 hours) at 03/12/2019 1602 Last data filed at 03/12/2019 1219 Gross per 24 hour  Intake 2420.88 ml  Output 1025 ml  Net 1395.88 ml     Physical Exam  Awake Alert, Oriented X 3, No new F.N deficits, Normal affect Symmetrical Chest wall movement, Good air movement bilaterally, CTAB RRR,No Gallops,Rubs or new Murmurs, No Parasternal Heave +ve B.Sounds, Abd Soft, No tenderness, No rebound - guarding or rigidity. No Cyanosis, Clubbing or edema, No new Rash or bruise       Data Review:    CBC Recent Labs  Lab 03/11/19 1527 03/12/19 0026  WBC 12.3* 6.2  HGB 14.4 13.6  HCT 42.8 41.1  PLT 231 199  MCV 91.3 91.9  MCH 30.7 30.4  MCHC 33.6 33.1  RDW 12.7 12.7  LYMPHSABS 3.0  --   MONOABS 0.9  --   EOSABS 0.1  --   BASOSABS 0.0  --     Chemistries  Recent Labs  Lab 03/11/19 1527  NA 136  K 3.6  CL 100  CO2 24  GLUCOSE 343*  BUN <5*  CREATININE 0.61  CALCIUM 9.0  AST 31  ALT 40  ALKPHOS 72  BILITOT 0.5   ------------------------------------------------------------------------------------------------------------------ No results for input(s): CHOL, HDL, LDLCALC, TRIG, CHOLHDL, LDLDIRECT in the last 72  hours.  Lab Results  Component Value Date   HGBA1C 11.0 (H) 04/28/2016   ------------------------------------------------------------------------------------------------------------------ No results for input(s): TSH, T4TOTAL, T3FREE, THYROIDAB in the last 72 hours.  Invalid input(s): FREET3 ------------------------------------------------------------------------------------------------------------------ No results for input(s): VITAMINB12, FOLATE, FERRITIN, TIBC, IRON, RETICCTPCT in the last 72 hours.  Coagulation profile Recent Labs  Lab 03/11/19 1527  INR 1.0    No results for input(s): DDIMER in the last 72 hours.  Cardiac Enzymes No results for input(s): CKMB, TROPONINI, MYOGLOBIN in the last 168 hours.  Invalid input(s): CK ------------------------------------------------------------------------------------------------------------------ No results found for: BNP  Inpatient Medications  Scheduled Meds: . folic acid  1 mg Oral Daily  . [START ON 03/13/2019] influenza vac split quadrivalent PF  0.5 mL Intramuscular Tomorrow-1000  . insulin aspart  0-5 Units Subcutaneous QHS  . insulin aspart  0-9 Units Subcutaneous TID WC  .  multivitamin with minerals  1 tablet Oral Daily  . nicotine  14 mg Transdermal Daily  . sodium chloride flush  3 mL Intravenous Once  . thiamine  100 mg Oral Daily   Or  . thiamine  100 mg Intravenous Daily   Continuous Infusions: . sodium chloride 1,000 mL (03/12/19 0846)  . cefTRIAXone (ROCEPHIN)  IV    . vancomycin 1,750 mg (03/12/19 0606)   PRN Meds:.sodium chloride, acetaminophen **OR** acetaminophen, hydrALAZINE, HYDROcodone-acetaminophen, LORazepam **OR** LORazepam, morphine injection  Micro Results Recent Results (from the past 240 hour(s))  Blood Culture (routine x 2)     Status: None (Preliminary result)   Collection Time: 03/11/19  3:26 PM   Specimen: BLOOD  Result Value Ref Range Status   Specimen Description BLOOD RIGHT  ANTECUBITAL  Final   Special Requests   Final    BOTTLES DRAWN AEROBIC AND ANAEROBIC Blood Culture results may not be optimal due to an inadequate volume of blood received in culture bottles   Culture   Final    NO GROWTH < 24 HOURS Performed at [facility], [address] [place], [place] [address]    Report Status PENDING  Incomplete  Blood Culture (routine x 2)     Status: None (Preliminary result)   Collection Time: 03/11/19  6:05 PM   Specimen: BLOOD  Result Value Ref Range Status   Specimen Description BLOOD RIGHT ANTECUBITAL  Final   Special Requests   Final    BOTTLES DRAWN AEROBIC AND ANAEROBIC Blood Culture adequate volume   Culture   Final    NO GROWTH < 24 HOURS Performed at [facility], [address] [place], [place] [address]    Report Status PENDING  Incomplete  SARS Coronavirus 2 [facility](Hospital order, Performed in [facility] lab) Nasopharyngeal Nasopharyngeal Swab     Status: None   Collection Time: 03/11/19  7:39 PM   Specimen: Nasopharyngeal Swab  Result Value Ref Range Status   SARS Coronavirus 2 NEGATIVE NEGATIVE Final    Comment: (NOTE) If result is NEGATIVE SARS-CoV-2 target nucleic acids are NOT DETECTED. The SARS-CoV-2 RNA is generally detectable in upper and lower  respiratory specimens during the acute phase of infection. The lowest  concentration of SARS-CoV-2 viral copies this assay can detect is 250  copies / mL. A negative result does not preclude SARS-CoV-2 infection  and should not be used as the sole basis for treatment or other  patient management decisions.  A negative result may occur with  improper specimen collection / handling, submission of specimen other  than nasopharyngeal swab, presence of viral mutation(s) within the  areas targeted by this assay, and inadequate number of viral copies  (<250 copies / mL). A negative result must be combined with clinical  observations, patient history, and epidemiological  information. If result is POSITIVE SARS-CoV-2 target nucleic acids are DETECTED. The SARS-CoV-2 RNA is generally detectable in upper and lower  respiratory specimens dur ing the acute phase of infection.  Positive  results are indicative of active infection with SARS-CoV-2.  Clinical  correlation with patient history and other diagnostic information is  necessary to determine patient infection status.  Positive results do  not rule out bacterial infection or co-infection with other viruses. If result is PRESUMPTIVE POSTIVE SARS-CoV-2 nucleic acids MAY BE PRESENT.   A presumptive positive result was obtained on the submitted specimen  and confirmed on repeat testing.  While 2019 novel coronavirus  (SARS-CoV-2) nucleic  acids may be present in the submitted sample  additional confirmatory testing may be necessary for epidemiological  and / or clinical management purposes  to differentiate between  SARS-CoV-2 and other Sarbecovirus currently known to infect humans.  If clinically indicated additional testing with an alternate test  methodology [phone]) is advised. The SARS-CoV-2 RNA is generally  detectable in upper and lower respiratory sp ecimens during the acute  phase of infection. The expected result is Negative. Fact Sheet for Patients:  StrictlyIdeas.no Fact Sheet for Healthcare Providers: BankingDealers.co.za This test is not yet approved or cleared by the [place] FDA and has been authorized for detection and/or diagnosis of SARS-CoV-2 by FDA under an Emergency Use Authorization (EUA).  This EUA will remain in effect (meaning this test can be used) for the duration of the COVID-19 declaration under Section 564(b)(1) of the Act, 21 U.S.C. section 360bbb-3(b)(1), unless the authorization is terminated or revoked sooner. Performed at [facility], [place] [address], [place], [place] [address]     Radiology Reports Mr  Foot Right Wo Contrast  Result Date: 03/11/2019 CLINICAL DATA:  Osteomyelitis, abscess on the right toe EXAM: MRI OF THE RIGHT FOREFOOT WITHOUT CONTRAST TECHNIQUE: Multiplanar, multisequence MR imaging of the right forefoot was performed. No intravenous contrast was administered. COMPARISON:  Radiograph March 11, 2019 FINDINGS: Bones/Joint/Cartilage There is increased T2 signal seen throughout the distal phalanx of the first digit. There is associated T1 hypointensity seen subtly at the distal tuft of the first digit. No definite cortical destruction periosteal reaction is seen. Mild first MTP joint osteoarthritis is seen with subchondral sclerosis. Ligaments The Lisfranc ligament appears to be intact. Muscles and Tendons There is diffuse fatty atrophy of the muscles surrounding the forefoot. The tendons appear to be intact. Soft tissues Area of skin irregularity in ulceration overlying the lateral aspect of the first digit measuring 9 mm. No sinus tract or loculated fluid collection is seen. There is diffuse subcutaneous edema and skin thickening. IMPRESSION: 1. Area of ulceration seen overlying the lateral aspect of the first toe with findings suggestive of early osteomyelitis involving the distal tuft. Reactive marrow seen throughout the remainder of the distal phalanx of the first digit. No abscess or sinus tract. Electronically Signed   By: Prudencio Pair M.D.   On: 03/11/2019 23:23   Dg Chest Port 1 View  Result Date: 03/11/2019 CLINICAL DATA:  Fever EXAM: PORTABLE CHEST 1 VIEW COMPARISON:  September 06, 2016 FINDINGS: Lungs are clear. Heart is upper normal in size with pulmonary vascularity normal. No adenopathy. No bone lesions. IMPRESSION: No edema or consolidation.  Heart upper normal in size. Electronically Signed   By: Lowella Grip III M.D.   On: 03/11/2019 16:55   Dg Foot Complete Right  Result Date: 03/11/2019 CLINICAL DATA:  Right great toe ulceration EXAM: RIGHT FOOT COMPLETE - 3+ VIEW  COMPARISON:  02/10/2019 FINDINGS: Soft tissue ulceration underlying the medial aspect of the right great toe along the plantar surface. No evidence of periostitis or cortical destruction to suggest acute osteomyelitis. No fracture or malalignment. Tiny plantar calcaneal spur. No radiopaque foreign body. IMPRESSION: 1. No radiographic evidence of acute osteomyelitis. 2. Soft tissue ulceration at the plantar-medial aspect of the right great toe. Electronically Signed   By: Davina Poke M.D.   On: 03/11/2019 17:25     Phillips Climes M.D on 03/12/2019 at 4:02 PM  Between 7am to 7pm - Pager - [phone]  After 7pm go to [email or web address] - password  [facility] -  Office  [phone]

## 2019-03-12 NOTE — Progress Notes (Signed)
Inpatient Diabetes Program Recommendations  AACE/ADA: New Consensus Statement on Inpatient Glycemic Control (2015)  Target Ranges:  Prepandial:   less than 140 mg/dL      Peak postprandial:   less than 180 mg/dL (1-2 hours)      Critically ill patients:  140 - 180 mg/dL   Lab Results  Component Value Date   GLUCAP 232 (H) 03/12/2019   HGBA1C 11.0 (H) 04/28/2016    Review of Glycemic Control Results for Chris Smith, Chris Smith (MRN [id number]) as of 03/12/2019 14:08  Ref. Range 03/11/2019 23:22 03/12/2019 08:08 03/12/2019 12:18  Glucose-Capillary Latest Ref Range: 70 - 99 mg/dL 196 (H) 194 (H) 232 (H)   Diabetes history: Tyep 2 DM Outpatient Diabetes medications: Lantus 20 -75 units QD per sliding scale? Current orders for Inpatient glycemic control: Novolog 0-9 units TID, Novolog 0-5 units QHS  Inpatient Diabetes Program Recommendations:    Last A1C from 2017, A1C in process.  Also, would recommend adding a portion of basal insulin. Consider Lantus 18 units QHS.   Will place consult for CM as patient has no insurance and will need PCP visit following hospitalization.  Thanks, Bronson Curb, MSN, RNC-OB Diabetes Coordinator [phone] (8a-5p)

## 2019-03-13 ENCOUNTER — Other Ambulatory Visit: Payer: Self-pay

## 2019-03-13 ENCOUNTER — Emergency Department (HOSPITAL_COMMUNITY)
Admission: EM | Admit: 2019-03-13 | Discharge: 2019-03-14 | Disposition: A | Discharge status: Home / Self Care | Payer: Self-pay | Attending: Emergency Medicine | Admitting: Emergency Medicine

## 2019-03-13 ENCOUNTER — Encounter (HOSPITAL_COMMUNITY): Payer: Self-pay | Admitting: Emergency Medicine

## 2019-03-13 DIAGNOSIS — E1165 Type 2 diabetes mellitus with hyperglycemia: Secondary | ICD-10-CM

## 2019-03-13 DIAGNOSIS — I10 Essential (primary) hypertension: Secondary | ICD-10-CM | POA: Insufficient documentation

## 2019-03-13 DIAGNOSIS — Z79899 Other long term (current) drug therapy: Secondary | ICD-10-CM | POA: Insufficient documentation

## 2019-03-13 DIAGNOSIS — E1169 Type 2 diabetes mellitus with other specified complication: Secondary | ICD-10-CM | POA: Insufficient documentation

## 2019-03-13 DIAGNOSIS — F17210 Nicotine dependence, cigarettes, uncomplicated: Secondary | ICD-10-CM | POA: Insufficient documentation

## 2019-03-13 DIAGNOSIS — E119 Type 2 diabetes mellitus without complications: Secondary | ICD-10-CM | POA: Insufficient documentation

## 2019-03-13 DIAGNOSIS — Z794 Long term (current) use of insulin: Secondary | ICD-10-CM | POA: Insufficient documentation

## 2019-03-13 DIAGNOSIS — M86171 Other acute osteomyelitis, right ankle and foot: Secondary | ICD-10-CM | POA: Insufficient documentation

## 2019-03-13 DIAGNOSIS — L089 Local infection of the skin and subcutaneous tissue, unspecified: Secondary | ICD-10-CM | POA: Insufficient documentation

## 2019-03-13 LAB — CBC
HCT: 43.4 % (ref 39.0–52.0)
Hemoglobin: 14.6 g/dL (ref 13.0–17.0)
MCH: 30.5 pg (ref 26.0–34.0)
MCHC: 33.6 g/dL (ref 30.0–36.0)
MCV: 90.8 fL (ref 80.0–100.0)
Platelets: 227 10*3/uL (ref 150–400)
RBC: 4.78 MIL/uL (ref 4.22–5.81)
RDW: 12.4 % (ref 11.5–15.5)
WBC: 7.6 10*3/uL (ref 4.0–10.5)
nRBC: 0 % (ref 0.0–0.2)

## 2019-03-13 LAB — BASIC METABOLIC PANEL
Anion gap: 8 (ref 5–15)
BUN: <5 mg/dL — ABNORMAL LOW (ref 6–20)
CO2: 26 mmol/L (ref 22–32)
Calcium: 8.8 mg/dL — ABNORMAL LOW (ref 8.9–10.3)
Chloride: 101 mmol/L (ref 98–111)
Creatinine, Ser: 0.63 mg/dL (ref 0.61–1.24)
GFR calc Af Amer: >60 mL/min (ref >60)
GFR calc non Af Amer: >60 mL/min (ref >60)
Glucose, Bld: 170 mg/dL — ABNORMAL HIGH (ref 70–99)
Potassium: 3.8 mmol/L (ref 3.5–5.1)
Sodium: 135 mmol/L (ref 135–145)

## 2019-03-13 LAB — GLUCOSE, CAPILLARY
Glucose-Capillary: 171 mg/dL — ABNORMAL HIGH (ref 70–99)
Glucose-Capillary: 206 mg/dL — ABNORMAL HIGH (ref 70–99)
Glucose-Capillary: 304 mg/dL — ABNORMAL HIGH (ref 70–99)

## 2019-03-13 LAB — MRSA PCR SCREENING: MRSA by PCR: NEGATIVE

## 2019-03-13 LAB — HEMOGLOBIN A1C
Hgb A1c MFr Bld: 8.8 % — ABNORMAL HIGH (ref 4.8–5.6)
Mean Plasma Glucose: 206 mg/dL

## 2019-03-13 MED ORDER — LISINOPRIL 20 MG PO TABS: 20.0000 mg | ORAL_TABLET | Freq: Every day | ORAL | Status: DC

## 2019-03-13 MED ORDER — KETOROLAC TROMETHAMINE 30 MG/ML IJ SOLN
30.0000 mg | Freq: Three times a day (TID) | INTRAMUSCULAR | Status: DC | PRN
  Administered 2019-03-13: 30 mg via INTRAVENOUS
  Filled 2019-03-13: qty 1

## 2019-03-13 MED ORDER — INSULIN ASPART PROT & ASPART (70-30 MIX) 100 UNIT/ML ~~LOC~~ SUSP
10.0000 [IU] | Freq: Two times a day (BID) | SUBCUTANEOUS | Status: DC
  Administered 2019-03-13: 10 [IU] via SUBCUTANEOUS
  Filled 2019-03-13: qty 10

## 2019-03-13 NOTE — Progress Notes (Signed)
Physical Therapy Wound Treatment Patient Details  Name: Shadow Schedler MRN: [id number] Date of Birth: 1976-07-02  Today's Date: 03/13/2019 Time: 1006-1029 Time Calculation (min): 23 min  Subjective  Subjective: Pt asking appropriate questions Patient and Family Stated Goals: Pt wants to get toe better Date of Onset: (several weeks ago)  Pain Score: Pain Score: 8. Constant throbbing at baseline. No change in pain with hydrotherapy.  Wound Assessment  Wound / Incision (Open or Dehisced) 03/11/19 Diabetic ulcer Toe (Comment  which one) (Active)  Wound Image   03/13/19 1013  Dressing Type Gauze (Comment);Moist to dry 03/13/19 1013  Dressing Changed Changed 03/13/19 1013  Dressing Status Clean;Dry;Intact 03/13/19 1013  Dressing Change Frequency Daily 03/13/19 1013  Site / Wound Assessment Pink;Red;Yellow;Brown 03/13/19 1013  % Wound base Red or Granulating 10% 03/13/19 1013  % Wound base Yellow/Fibrinous Exudate 85% 03/13/19 1013  % Wound base Black/Eschar 5% 03/13/19 1013  % Wound base Other/Granulation Tissue (Comment) 0% 03/13/19 1013  Peri-wound Assessment Other (Comment) 03/13/19 1013  Wound Length (cm) 1.8 cm 03/13/19 1013  Wound Width (cm) 1.5 cm 03/13/19 1013  Wound Depth (cm) 0.6 cm 03/13/19 1013  Wound Volume (cm^3) 1.62 cm^3 03/13/19 1013  Wound Surface Area (cm^2) 2.7 cm^2 03/13/19 1013  Margins Unattached edges (unapproximated) 03/13/19 1013  Closure None 03/13/19 1013  Drainage Amount Scant 03/13/19 1013  Drainage Description Serous 03/13/19 1013  Treatment Debridement (Selective);Hydrotherapy (Pulse lavage);Packing (Saline gauze) 03/13/19 1013   Hydrotherapy Pulsed lavage therapy - wound location: rt great toe Pulsed Lavage with Suction (psi): 12 psi Pulsed Lavage with Suction - Normal Saline Used: 1000 mL Pulsed Lavage Tip: Tip with splash shield Selective Debridement Selective Debridement - Location: rt great toe Selective Debridement - Tools Used: Forceps;Scissors  Selective Debridement - Tissue Removed: yellow and brown necrotic tissue   Wound Assessment and Plan  Wound Therapy - Assess/Plan/Recommendations Wound Therapy - Clinical Statement: Pt presents with rt great toe wound. Can benefit from hydrotherapy for removal of nonviable tissue to promote wound healing.  Wound Therapy - Functional Problem List: high risk for loss of great toe Factors Delaying/Impairing Wound Healing: Diabetes Mellitus;Infection - systemic/local;Tobacco use;Substance abuse Hydrotherapy Plan: Debridement;Dressing change;Patient/family education;Pulsatile lavage with suction Wound Therapy - Frequency: 6X / week Wound Therapy - Follow Up Recommendations: [place] Wound Plan: See above  Wound Therapy Goals- Improve the function of patient's integumentary system by progressing the wound(s) through the phases of wound healing (inflammation - proliferation - remodeling) by: Decrease Necrotic Tissue to: 50 Decrease Necrotic Tissue - Progress: Goal set today Increase Granulation Tissue to: 50 Increase Granulation Tissue - Progress: Goal set today Goals/treatment plan/discharge plan were made with and agreed upon by patient/family: Yes Time For Goal Achievement: 7 days Wound Therapy - Potential for Goals: Fair  Goals will be updated until maximal potential achieved or discharge criteria met.  Discharge criteria: when goals achieved, discharge from hospital, MD decision/surgical intervention, no progress towards goals, refusal/missing three consecutive treatments without notification or medical reason.  GP     Shary Decamp [facility] 03/13/2019, 10:51 AM [place] Pager [phone] Office [phone]

## 2019-03-13 NOTE — ED Notes (Signed)
[phone] Crosby Oyster states pt needs to be stabilized, feels the patient is having a psychotic break; has more information on how the pt has been acting lately if needed call. Relation: family friend

## 2019-03-13 NOTE — Progress Notes (Signed)
Went to patients room to do shift assessment. Patient was agitated and in a hurry to leave stating that he had to go take care of an emergent situation. Patient was unattached from IV which had been removed by patient. Pt arm bleeding, pressure was applied to stop bleeding. Asked patient what was wrong. Pt just kept stating that he had to leave. Made patient aware of the policy to leave against medical advice and patient agreed. Patient signed AMA form and stated that he will be back. Told patient he would have to be re-admitted again through ED. Paged MD on call to make aware of situation.

## 2019-03-13 NOTE — Progress Notes (Signed)
PROGRESS NOTE                                                                                                                                                                                                             Patient Demographics:    Chris Smith, is a 43 y.o. male, DOB - 06-04-1976, YIF:[id number]  Admit date - 03/11/2019   Admitting Physician Shela Leff, MD  Outpatient Primary MD for the patient is Rankins, Bill Salinas, MD  LOS - 2   No chief complaint on file.      Brief Narrative    Chris Smith is a 43 y.o. male with medical history significant of chronic back pain, sciatica, type 2 diabetes, hypertension, alcohol use, tobacco use presenting to the hospital for evaluation of right great toe wound.  Patient states over a month ago he noticed a tiny blood blister at the bottom of his right great toe.  He used nail clippers to pop the blister.  Since then he has had increasing pain and swelling in that area.  States he was seen in the emergency room about a month ago and was given a shot of a steroid and sent home.  States he was not sent home on any antibiotics.  He last saw his primary care physician 4 years ago but since then has not had a follow-up due to lack of medical insurance.  He is currently checking his blood sugars as needed and using his father's diabetes medications given to him by his stepmother.  Reports having fevers and chills at home.  Patient was very upset and stated he does not want to lose his toe. No additional history could be obtained from him.  Patient was seen for similar complaints in the ED on 02/10/2019 and underwent I&D for an abscess underlying the right great toe ulcer.  He was treated with IV clindamycin and ciprofloxacin in the ED.  Discharged on an outpatient course of antibiotics.  ED Course: Afebrile, tachycardic.  Blood pressure elevated.  White count 12.3.  Lactic acid 2.2 >2.2.  Blood glucose 343.   Bicarb 24, anion gap 12.  Creatinine 0.6, at baseline.  Blood culture x2 pending.  UA not suggestive of infection.  SARS-CoV-2 test pending.  Chest x-ray showing no edema or consolidation, heart upper normal in size.  X-ray of  right foot without evidence of acute osteomyelitis.  Showing soft tissue ulceration of the plantar medial aspect of the right great toe. Patient was started on vancomycin and ceftriaxone.  Received 2 L fluid bolus.   Subjective:    Trejon Smith today has, No headache, No chest pain, No abdominal pain - No Nausea, ports some pain in his great toe.   Assessment  & Plan :    Principal Problem:   Osteomyelitis due to type 2 diabetes mellitus (HCC) Active Problems:   Diabetic ulcer of right great toe (HCC)   Sepsis (HCC)   Hyperglycemia   Poorly controlled diabetes mellitus (HCC)   Sepsis secondary to diabetic right great toe ulcer, early osteomyelitis - Afebrile.  White count 12.3.  Initially tachycardic, now resolved.  Lactic acid 2.2 >2.2.  MRI right foot showing area of ulceration overlying the lateral aspect of the first toe with findings suggestive of early osteomyelitis involving the distal tuft.  Reactive marrow seen throughout the remainder of the distal phalanx of the first digit.  No abscess or sinus tract. -Continue with IV antibiotics during hospital stay, patient was seen by orthopedic, and he would like to try nonoperative approach, and would like to try to save the toe, I have discussed with ID Dr power about appropriate regimen for discharge, given his MRI showing osteomyelitis only in the distal tuft area, conservative approach with antibiotic can be tried including 6 weeks of antibiotic regimen, can be oral regimen as it only involves 1 joint, regimen would include quinolone, if MRSA positive, will need doxycycline as well.   Hyperglycemia, poorly controlled insulin-dependent type 2 diabetes mellitus Patient has not seen a primary care physician in 4 years.   He is currently checking his blood glucose at home as needed and taking his father's diabetes medications.  Random blood glucose 343.  Labs not suggestive of DKA. -Sliding scale insulin with bedtime coverage and CBG checks.    Will start on insulin 70/30, 10 units twice daily, as this would be more affordable option on discharge. -A1c elevated at 8.8   poorly controlled hypertension In the setting of lack of medical care outside the hospital.  Not on any antihypertensives at home.  Started on lisinopril 10 mg oral daily, frequently improved, but remains on the higher side, I will increase his lisinopril dose to 20 mg oral daily .  Continue with Norvasc.  Tobacco use -Counseling -NicoDerm patch  Alcohol abuse Patient reports consuming alcohol 3-4 times a week.  No signs of withdrawal at this time. -CIWA protocol; Ativan PRN -Thiamine, folate, multivitamin  COVID-19 Labs  No results for input(s): DDIMER, FERRITIN, LDH, CRP in the last 72 hours.  Lab Results  Component Value Date   SARSCOV2NAA NEGATIVE 03/11/2019   SARSCOV2NAA NOT DETECTED 02/27/2019     Code Status : Full  Family Communication  : D/W patient  Disposition Plan  : Home  Barriers For Discharge : Remains on IV antibiotics, will need prolonged course of antibiotics  Consults  :  Orthopedic  Procedures  : None  DVT Prophylaxis  :  [place] lovenox Lab Results  Component Value Date   PLT 227 03/13/2019    Antibiotics  :    Anti-infectives (From admission, onward)   Start     Dose/Rate Route Frequency Ordered Stop   03/12/19 1730  cefTRIAXone (ROCEPHIN) 2 g in sodium chloride 0.9 % 100 mL IVPB     2 g 200 mL/hr over 30 Minutes Intravenous Every  24 hours 03/11/19 2021     03/12/19 0600  vancomycin (VANCOCIN) 1,750 mg in sodium chloride 0.9 % 500 mL IVPB     1,750 mg 250 mL/hr over 120 Minutes Intravenous Every 12 hours 03/11/19 1723     03/11/19 1700  vancomycin (VANCOCIN) 3,000 mg in sodium chloride 0.9 % 500  mL IVPB  Status:  Discontinued     3,000 mg 250 mL/hr over 120 Minutes Intravenous  Once 03/11/19 1649 03/11/19 1652   03/11/19 1700  vancomycin (VANCOCIN) 2,500 mg in sodium chloride 0.9 % 500 mL IVPB     2,500 mg 250 mL/hr over 120 Minutes Intravenous  Once 03/11/19 1652 03/11/19 2154   03/11/19 1645  vancomycin (VANCOCIN) IVPB 1000 mg/200 mL premix  Status:  Discontinued     1,000 mg 200 mL/hr over 60 Minutes Intravenous  Once 03/11/19 1642 03/11/19 1649   03/11/19 1645  cefTRIAXone (ROCEPHIN) 2 g in sodium chloride 0.9 % 100 mL IVPB     2 g 200 mL/hr over 30 Minutes Intravenous  Once 03/11/19 1642 03/11/19 1802        Objective:   Vitals:   03/12/19 1813 03/13/19 0035 03/13/19 0549 03/13/19 1355  BP: (!) 165/116 (!) 148/114 (!) 147/103 (!) 149/95  Pulse: 80 78 78 85  Resp: 18  16 18   Temp: 98 F (36.7 C)  97.7 F (36.5 C) 98.8 F (37.1 C)  TempSrc: Oral  Oral Oral  SpO2: 99%  98% 97%  Weight:      Height:        Wt Readings from Last 3 Encounters:  03/11/19 110 kg  02/10/19 122.5 kg  03/18/17 117.9 kg     Intake/Output Summary (Last 24 hours) at 03/13/2019 1617 Last data filed at 03/13/2019 1529 Gross per 24 hour  Intake 1724.73 ml  Output 1225 ml  Net 499.73 ml     Physical Exam  Awake Alert, Oriented X 3, No new F.N deficits, Normal affect Symmetrical Chest wall movement, Good air movement bilaterally, CTAB RRR,No Gallops,Rubs or new Murmurs, No Parasternal Heave +ve B.Sounds, Abd Soft, No tenderness, No rebound - guarding or rigidity. No Cyanosis, Clubbing or edema, No new Rash or bruise           Data Review:    CBC Recent Labs  Lab 03/11/19 1527 03/12/19 0026 03/13/19 0306  WBC 12.3* 6.2 7.6  HGB 14.4 13.6 14.6  HCT 42.8 41.1 43.4  PLT 231 199 227  MCV 91.3 91.9 90.8  MCH 30.7 30.4 30.5  MCHC 33.6 33.1 33.6  RDW 12.7 12.7 12.4  LYMPHSABS 3.0  --   --   MONOABS 0.9  --   --   EOSABS 0.1  --   --   BASOSABS 0.0  --   --      Chemistries  Recent Labs  Lab 03/11/19 1527 03/13/19 0306  NA 136 135  K 3.6 3.8  CL 100 101  CO2 24 26  GLUCOSE 343* 170*  BUN <5* <5*  CREATININE 0.61 0.63  CALCIUM 9.0 8.8*  AST 31  --   ALT 40  --   ALKPHOS 72  --   BILITOT 0.5  --    ------------------------------------------------------------------------------------------------------------------ No results for input(s): CHOL, HDL, LDLCALC, TRIG, CHOLHDL, LDLDIRECT in the last 72 hours.  Lab Results  Component Value Date   HGBA1C 8.8 (H) 03/12/2019   ------------------------------------------------------------------------------------------------------------------ No results for input(s): TSH, T4TOTAL, T3FREE, THYROIDAB in the last 72 hours.  Invalid input(s): FREET3 ------------------------------------------------------------------------------------------------------------------ No results for input(s): VITAMINB12, FOLATE, FERRITIN, TIBC, IRON, RETICCTPCT in the last 72 hours.  Coagulation profile Recent Labs  Lab 03/11/19 1527  INR 1.0    No results for input(s): DDIMER in the last 72 hours.  Cardiac Enzymes No results for input(s): CKMB, TROPONINI, MYOGLOBIN in the last 168 hours.  Invalid input(s): CK ------------------------------------------------------------------------------------------------------------------ No results found for: BNP  Inpatient Medications  Scheduled Meds: . amLODipine  5 mg Oral Daily  . folic acid  1 mg Oral Daily  . influenza vac split quadrivalent PF  0.5 mL Intramuscular Tomorrow-1000  . insulin aspart  0-5 Units Subcutaneous QHS  . insulin aspart  0-9 Units Subcutaneous TID WC  . lisinopril  10 mg Oral Daily  . multivitamin with minerals  1 tablet Oral Daily  . nicotine  14 mg Transdermal Daily  . sodium chloride flush  3 mL Intravenous Once  . thiamine  100 mg Oral Daily   Or  . thiamine  100 mg Intravenous Daily   Continuous Infusions: . sodium chloride 1,000 mL  (03/12/19 0846)  . cefTRIAXone (ROCEPHIN)  IV 2 g (03/12/19 1620)  . vancomycin 1,750 mg (03/13/19 0552)   PRN Meds:.sodium chloride, acetaminophen **OR** acetaminophen, hydrALAZINE, HYDROcodone-acetaminophen, ketorolac, LORazepam **OR** LORazepam, morphine injection  Micro Results Recent Results (from the past 240 hour(s))  Blood Culture (routine x 2)     Status: None (Preliminary result)   Collection Time: 03/11/19  3:26 PM   Specimen: BLOOD  Result Value Ref Range Status   Specimen Description BLOOD RIGHT ANTECUBITAL  Final   Special Requests   Final    BOTTLES DRAWN AEROBIC AND ANAEROBIC Blood Culture results may not be optimal due to an inadequate volume of blood received in culture bottles   Culture   Final    NO GROWTH < 24 HOURS Performed at [facility], [address], [place], [place] [address]    Report Status PENDING  Incomplete  Blood Culture (routine x 2)     Status: None (Preliminary result)   Collection Time: 03/11/19  6:05 PM   Specimen: BLOOD  Result Value Ref Range Status   Specimen Description BLOOD RIGHT ANTECUBITAL  Final   Special Requests   Final    BOTTLES DRAWN AEROBIC AND ANAEROBIC Blood Culture adequate volume   Culture   Final    NO GROWTH < 24 HOURS Performed at [facility], [address] [place], [place] [address]    Report Status PENDING  Incomplete  SARS Coronavirus 2 [facility] order, Performed in [facility] hospital lab) Nasopharyngeal Nasopharyngeal Swab     Status: None   Collection Time: 03/11/19  7:39 PM   Specimen: Nasopharyngeal Swab  Result Value Ref Range Status   SARS Coronavirus 2 NEGATIVE NEGATIVE Final    Comment: (NOTE) If result is NEGATIVE SARS-CoV-2 target nucleic acids are NOT DETECTED. The SARS-CoV-2 RNA is generally detectable in upper and lower  respiratory specimens during the acute phase of infection. The lowest  concentration of SARS-CoV-2 viral copies this assay can detect is 250  copies / mL. A  negative result does not preclude SARS-CoV-2 infection  and should not be used as the sole basis for treatment or other  patient management decisions.  A negative result may occur with  improper specimen collection / handling, submission of specimen other  than nasopharyngeal swab, presence of viral mutation(s) within the  areas targeted by this assay, and inadequate number  of viral copies  (<250 copies / mL). A negative result must be combined with clinical  observations, patient history, and epidemiological information. If result is POSITIVE SARS-CoV-2 target nucleic acids are DETECTED. The SARS-CoV-2 RNA is generally detectable in upper and lower  respiratory specimens dur ing the acute phase of infection.  Positive  results are indicative of active infection with SARS-CoV-2.  Clinical  correlation with patient history and other diagnostic information is  necessary to determine patient infection status.  Positive results do  not rule out bacterial infection or co-infection with other viruses. If result is PRESUMPTIVE POSTIVE SARS-CoV-2 nucleic acids MAY BE PRESENT.   A presumptive positive result was obtained on the submitted specimen  and confirmed on repeat testing.  While 2019 novel coronavirus  (SARS-CoV-2) nucleic acids may be present in the submitted sample  additional confirmatory testing may be necessary for epidemiological  and / or clinical management purposes  to differentiate between  SARS-CoV-2 and other Sarbecovirus currently known to infect humans.  If clinically indicated additional testing with an alternate test  methodology [phone]([id number]) is advised. The SARS-CoV-2 RNA is generally  detectable in upper and lower respiratory sp ecimens during the acute  phase of infection. The expected result is Negative. Fact Sheet for Patients:  BoilerBrush.com.cyhttps://www.fda.gov/media/136312/download Fact Sheet for Healthcare Providers: https://pope.com/https://www.fda.gov/media/136313/download This test is not  yet approved or cleared by the [place] FDA and has been authorized for detection and/or diagnosis of SARS-CoV-2 by FDA under an Emergency Use Authorization (EUA).  This EUA will remain in effect (meaning this test can be used) for the duration of the COVID-19 declaration under Section 564(b)(1) of the Act, 21 U.S.C. section 360bbb-3(b)(1), unless the authorization is terminated or revoked sooner. Performed at [facility], [address] [place], [place] [address]   Urine culture     Status: None   Collection Time: 03/11/19 10:01 PM   Specimen: In/Out Cath Urine  Result Value Ref Range Status   Specimen Description IN/OUT CATH URINE  Final   Special Requests NONE  Final   Culture   Final    NO GROWTH Performed at [facility], [address] [place], [place] [address]    Report Status 03/12/2019 FINAL  Final  MRSA PCR Screening     Status: None   Collection Time: 03/13/19 11:50 AM   Specimen: Nasopharyngeal  Result Value Ref Range Status   MRSA by PCR NEGATIVE NEGATIVE Final    Comment:        The GeneXpert MRSA Assay (FDA approved for NASAL specimens only), is one component of a comprehensive MRSA colonization surveillance program. It is not intended to diagnose MRSA infection nor to guide or monitor treatment for MRSA infections. Performed at [facility], [address] [place], [place] [address]     Radiology Reports Mr Foot Right Wo Contrast  Result Date: 03/11/2019 CLINICAL DATA:  Osteomyelitis, abscess on the right toe EXAM: MRI OF THE RIGHT FOREFOOT WITHOUT CONTRAST TECHNIQUE: Multiplanar, multisequence MR imaging of the right forefoot was performed. No intravenous contrast was administered. COMPARISON:  Radiograph March 11, 2019 FINDINGS: Bones/Joint/Cartilage There is increased T2 signal seen throughout the distal phalanx of the first digit. There is associated T1 hypointensity seen subtly at the distal tuft of the first digit. No  definite cortical destruction periosteal reaction is seen. Mild first MTP joint osteoarthritis is seen with subchondral sclerosis. Ligaments The Lisfranc ligament appears to be intact. Muscles and Tendons There is  diffuse fatty atrophy of the muscles surrounding the forefoot. The tendons appear to be intact. Soft tissues Area of skin irregularity in ulceration overlying the lateral aspect of the first digit measuring 9 mm. No sinus tract or loculated fluid collection is seen. There is diffuse subcutaneous edema and skin thickening. IMPRESSION: 1. Area of ulceration seen overlying the lateral aspect of the first toe with findings suggestive of early osteomyelitis involving the distal tuft. Reactive marrow seen throughout the remainder of the distal phalanx of the first digit. No abscess or sinus tract. Electronically Signed   By: Jonna Clark M.D.   On: 03/11/2019 23:23   Dg Chest Port 1 View  Result Date: 03/11/2019 CLINICAL DATA:  Fever EXAM: PORTABLE CHEST 1 VIEW COMPARISON:  September 06, 2016 FINDINGS: Lungs are clear. Heart is upper normal in size with pulmonary vascularity normal. No adenopathy. No bone lesions. IMPRESSION: No edema or consolidation.  Heart upper normal in size. Electronically Signed   By: Bretta Bang III M.D.   On: 03/11/2019 16:55   Dg Foot Complete Right  Result Date: 03/11/2019 CLINICAL DATA:  Right great toe ulceration EXAM: RIGHT FOOT COMPLETE - 3+ VIEW COMPARISON:  02/10/2019 FINDINGS: Soft tissue ulceration underlying the medial aspect of the right great toe along the plantar surface. No evidence of periostitis or cortical destruction to suggest acute osteomyelitis. No fracture or malalignment. Tiny plantar calcaneal spur. No radiopaque foreign body. IMPRESSION: 1. No radiographic evidence of acute osteomyelitis. 2. Soft tissue ulceration at the plantar-medial aspect of the right great toe. Electronically Signed   By: Duanne Guess M.D.   On: 03/11/2019 17:25     Huey Bienenstock M.D on 03/13/2019 at 4:17 PM  Between 7am to 7pm - Pager - [phone]  After 7pm go to [email or web address] - password [facility]  [facility] -  Office  [phone]

## 2019-03-13 NOTE — ED Triage Notes (Signed)
Pt admitted for infection to right great toe, decided to [place] around 2030, states he had an emergent situation to tend to. Pt has been receiving IV rocephin and vancomycin x 3 days.

## 2019-03-14 LAB — CBC WITH DIFFERENTIAL/PLATELET
Abs Immature Granulocytes: 0.01 10*3/uL (ref 0.00–0.07)
Basophils Absolute: 0.1 10*3/uL (ref 0.0–0.1)
Basophils Relative: 1 %
Eosinophils Absolute: 0.1 10*3/uL (ref 0.0–0.5)
Eosinophils Relative: 2 %
HCT: 41 % (ref 39.0–52.0)
Hemoglobin: 13.7 g/dL (ref 13.0–17.0)
Immature Granulocytes: 0 %
Lymphocytes Relative: 38 %
Lymphs Abs: 2.1 10*3/uL (ref 0.7–4.0)
MCH: 30.9 pg (ref 26.0–34.0)
MCHC: 33.4 g/dL (ref 30.0–36.0)
MCV: 92.3 fL (ref 80.0–100.0)
Monocytes Absolute: 0.6 10*3/uL (ref 0.1–1.0)
Monocytes Relative: 10 %
Neutro Abs: 2.7 10*3/uL (ref 1.7–7.7)
Neutrophils Relative %: 49 %
Platelets: 224 10*3/uL (ref 150–400)
RBC: 4.44 MIL/uL (ref 4.22–5.81)
RDW: 12.7 % (ref 11.5–15.5)
WBC: 5.6 10*3/uL (ref 4.0–10.5)
nRBC: 0 % (ref 0.0–0.2)

## 2019-03-14 LAB — BASIC METABOLIC PANEL
Anion gap: 10 (ref 5–15)
BUN: <5 mg/dL — ABNORMAL LOW (ref 6–20)
CO2: 23 mmol/L (ref 22–32)
Calcium: 8.5 mg/dL — ABNORMAL LOW (ref 8.9–10.3)
Chloride: 106 mmol/L (ref 98–111)
Creatinine, Ser: 0.7 mg/dL (ref 0.61–1.24)
GFR calc Af Amer: >60 mL/min (ref >60)
GFR calc non Af Amer: >60 mL/min (ref >60)
Glucose, Bld: 243 mg/dL — ABNORMAL HIGH (ref 70–99)
Potassium: 3.8 mmol/L (ref 3.5–5.1)
Sodium: 139 mmol/L (ref 135–145)

## 2019-03-14 MED ORDER — INSULIN ASPART PROT & ASPART (70-30 MIX) 100 UNIT/ML ~~LOC~~ SUSP: 10.0000 [IU] | Freq: Two times a day (BID) | SUBCUTANEOUS | 0 refills | Status: DC

## 2019-03-14 MED ORDER — LISINOPRIL 20 MG PO TABS: 20.0000 mg | ORAL_TABLET | Freq: Every day | ORAL | 0 refills | Status: DC

## 2019-03-14 MED ORDER — LEVOFLOXACIN 750 MG PO TABS: 750.0000 mg | ORAL_TABLET | Freq: Every day | ORAL | 0 refills | Status: DC

## 2019-03-14 MED ORDER — PEN NEEDLES 31G X 8 MM MISC: 0 refills | Status: DC

## 2019-03-14 MED ORDER — HYDROCODONE-ACETAMINOPHEN 5-325 MG PO TABS
1.0000 | ORAL_TABLET | Freq: Once | ORAL | Status: AC
  Administered 2019-03-14: 1 via ORAL
  Filled 2019-03-14: qty 1

## 2019-03-14 MED FILL — LISINOPRIL 20 MG TABLET: 20 | 30 days supply | Qty: 30 | Fill #0

## 2019-03-14 MED FILL — PENTIPS 31G X 8 MM MISC: 31G X 8 MM | 30 days supply | Qty: 100 | Fill #0

## 2019-03-14 MED FILL — NOVOLOG MIX 70-30 FLEXPEN S: (70-30) 100 | 16 days supply | Qty: 6 | Fill #0

## 2019-03-14 MED FILL — levoFLOXacin 750 MG TABS: 750 | 30 days supply | Qty: 42 | Fill #0

## 2019-03-14 NOTE — TOC Initial Note (Signed)
Transition of Care [facility]) - Initial/Assessment Note    Patient Details  Name: Chris Smith MRN: [id number] Date of Birth: 08-25-1975  Transition of Care [facility]) CM/SW Contact:    Fuller Mandril, RN Phone Number: 03/14/2019, 11:13 AM  Clinical Narrative:                 [facility] consulted regarding uninsured pt PCP follow-up and medication assistance.  Expected Discharge Plan: Home/Self Care Barriers to Discharge: Barriers Resolved   Patient Goals and CMS Choice Patient states their goals for this hospitalization and ongoing recovery are:: get medication I need      Expected Discharge Plan and Services Expected Discharge Plan: Home/Self Care   Discharge Planning Services: CM Consult, Follow-up appt scheduled, MATCH Program, [facility]   Living arrangements for the past 2 months: Single Family Home                                      Prior Living Arrangements/Services Living arrangements for the past 2 months: Single Family Home Lives with:: Self                   Activities of Daily Living      Permission Sought/Granted                  Emotional Assessment              Admission diagnosis:  abscess Patient Active Problem List   Diagnosis Date Noted  . Osteomyelitis due to type 2 diabetes mellitus ([place]) 03/12/2019  . Sepsis ([place]) 03/12/2019  . Hyperglycemia 03/12/2019  . Poorly controlled diabetes mellitus ([place]) 03/12/2019  . Diabetic ulcer of right great toe ([place]) 03/11/2019  . Dehydration   . Nausea with vomiting 09/07/2016  . Hypokalemia 09/07/2016  . Abdominal pain 04/28/2016  . Enteritis 04/28/2016  . Acidosis 04/28/2016  . Prolonged Q-T interval on ECG 04/28/2016  . Hypomagnesemia 04/28/2016  . ETOH abuse 04/28/2016  . Abdominal pain, bilateral upper quadrant 04/28/2016  . Tachycardia 04/28/2016  . Tobacco abuse 04/28/2016  . Obesity 04/28/2016  . Fatty liver 04/28/2016  . Chronic back pain 04/28/2016  . Memory loss  10/25/2014  . Depression 10/25/2014  . Diabetic mononeuropathy associated with diabetes mellitus due to underlying condition ([place]) 10/25/2014  . Amnesia 10/13/2014  . Hypertension 10/13/2014  . Diabetes mellitus without complication ([place]) 62/70/3500  . Lumbago 10/13/2014   PCP:  Aretta Nip, MD Pharmacy:   [facility] [place], [place] [address] Phone: [phone] Fax: [phone]  [facility] [phone] [place] - [address] [place] [address] Phone: [phone] Fax: [phone]  [place] ([place]), [place] - [address] [place] [address] Phone: [phone] Fax: [place], [place] - [address] [place] [address] Phone: [phone] Fax: [phone]     Social Determinants of Health (SDOH) Interventions    Readmission Risk Interventions No flowsheet data found.

## 2019-03-14 NOTE — TOC Transition Note (Signed)
Transition of Care [facility]) - CM/SW Discharge Note   Patient Details  Name: Chris Smith MRN: [id number] Date of Birth: 05/17/1976  Transition of Care [facility]) CM/SW Contact:  Chris Mandril, RN Phone Number: 03/14/2019, 11:14 AM   Clinical Narrative:    ED CM consulted by EDP Ward for medication assistance. EDCM reviewed chart and spoke with the pt about [facility] MATCH program ($3 co pay for each Rx through [facility] program, does not include refills, 7 day expiration of [place] letter and choice of pharmacies). Pt is eligible for [facility] MATCH program (unable to find pt listed in PROCARE per cardholder name inquiry) and has agreed to accept [facility] with co-pay override.  PROCARE information entered. [facility] notified to deliver Rx to pt prior to discharge home today. EDCM updated EDP.   Chris Sturgeon J. Clydene Laming, RN, BSN, [place]  [facility] set up appointment with Chris Smith on 9/22 @ 2:30.  Spoke with pt at bedside and advised to please arrive 15 min early and take a picture ID and your current medications.  Pt verbalizes understanding of keeping appointment.    Final next level of care: Home/Self Care Barriers to Discharge: Barriers Resolved   Patient Goals and CMS Choice Patient states their goals for this hospitalization and ongoing recovery are:: get medication I need      Discharge Placement                       Discharge Plan and Services   Discharge Planning Services: CM Consult, Follow-up appt scheduled, [place], [facility]                                 Social Determinants of Health (SDOH) Interventions     Readmission Risk Interventions No flowsheet data found.

## 2019-03-14 NOTE — ED Provider Notes (Signed)
[facility] Provider Note   CSN: [id number] Arrival date & time: 03/13/19  2128     History   Chief Complaint Chief Complaint  Patient presents with   Wound Infection    HPI Chris Smith is a 43 y.o. male.     The history is provided by the patient and medical records. No language interpreter was used.   Chris Smith is a 43 y.o. male  with a PMH as listed below including osteomyelitis of great toe who presents to the Emergency Department complaining of right great toe pain.  Patient was recently admitted for osteo-of the toe a couple of days ago and was receiving IV antibiotics.  Yesterday afternoon, he left AMA, stating that his wife found out that her mother was dying and he needed to be there for her.  He had all intentions of coming back, but could not be in the hospital during such time.  He states that he left in such a hurry that he did not receive any paperwork or prescriptions for antibiotics.  He has no primary care doctor or follow-up plan.  He feels as if things are overall improving. Denies fevers.   Past Medical History:  Diagnosis Date   Acute respiratory failure (HCC) 09/2014   Amnesia    Aspiration pneumonia (HCC) 09/2014   Chronic back pain    Diabetes mellitus without complication (HCC)    Encephalopathy 09/2014   ETOH abuse    Hypertension    Ischemic hepatitis unknown   Rhabdomyolysis unknown   Sciatica     Patient Active Problem List   Diagnosis Date Noted   Osteomyelitis due to type 2 diabetes mellitus (HCC) 03/12/2019   Sepsis (HCC) 03/12/2019   Hyperglycemia 03/12/2019   Poorly controlled diabetes mellitus (HCC) 03/12/2019   Diabetic ulcer of right great toe (HCC) 03/11/2019   Dehydration    Nausea with vomiting 09/07/2016   Hypokalemia 09/07/2016   Abdominal pain 04/28/2016   Enteritis 04/28/2016   Acidosis 04/28/2016   Prolonged Q-T interval on ECG 04/28/2016   Hypomagnesemia  04/28/2016   ETOH abuse 04/28/2016   Abdominal pain, bilateral upper quadrant 04/28/2016   Tachycardia 04/28/2016   Tobacco abuse 04/28/2016   Obesity 04/28/2016   Fatty liver 04/28/2016   Chronic back pain 04/28/2016   Memory loss 10/25/2014   Depression 10/25/2014   Diabetic mononeuropathy associated with diabetes mellitus due to underlying condition (HCC) 10/25/2014   Amnesia 10/13/2014   Hypertension 10/13/2014   Diabetes mellitus without complication (HCC) 10/13/2014   Lumbago 10/13/2014    Past Surgical History:  Procedure Laterality Date   APPENDECTOMY     BACK SURGERY  2002   Lumbar        Home Medications    Prior to Admission medications   Medication Sig Start Date End Date Taking? Authorizing Provider  ciprofloxacin (CIPRO) 500 MG tablet Take 1 tablet (500 mg total) by mouth 2 (two) times daily. Patient not taking: Reported on 03/11/2019 02/10/19   Gerhard MunchLockwood, Robert, MD  ibuprofen (ADVIL) 200 MG tablet Take 400 mg by mouth every 6 (six) hours as needed for headache or mild pain.    [provider]  insulin aspart protamine- aspart (NOVOLOG MIX 70/30) (70-30) 100 UNIT/ML injection Inject 0.1 mLs (10 Units total) into the skin 2 (two) times daily with a meal. 03/14/19   Meko Bellanger, Chris PicketJaime Pilcher, Chris Smith  insulin glargine (LANTUS) 100 UNIT/ML injection Inject 20-75 Units into the skin See admin instructions.  Inject 20-75 units into the skin one to two times a day, per sliding scale    [provider]  Insulin Pen Needle (PEN NEEDLES) 31G X 8 MM MISC Use as directed 03/14/19   Lokelani Lutes, Ozella Almond, Chris Smith  levofloxacin (LEVAQUIN) 750 MG tablet Take 1 tablet (750 mg total) by mouth daily. X 6 weeks 03/14/19   Auriel Kist, Ozella Almond, Chris Smith  lisinopril (ZESTRIL) 20 MG tablet Take 1 tablet (20 mg total) by mouth daily. 03/14/19   Amira Podolak, Ozella Almond, Chris Smith    Family History Family History  Problem Relation Age of Onset   Cancer Mother        cause of death,  ovarian & colon   Diabetes Father    Hypertension Father    Gout Father    Hyperlipidemia Father     Social History Social History   Tobacco Use   Smoking status: Current Every Day Smoker    Packs/day: 0.50    Years: 18.00    Pack years: 9.00    Types: Cigarettes   Smokeless tobacco: Never Used  Substance Use Topics   Alcohol use: Yes    Alcohol/week: 0.0 standard drinks    Comment: 3 OR 4 DAYS PER WEEK   Drug use: No     Allergies   Patient has no known allergies.   Review of Systems Review of Systems  Musculoskeletal: Positive for arthralgias.  Skin: Positive for wound.  All other systems reviewed and are negative.    Physical Exam Updated Vital Signs BP (!) 146/85    Pulse 86    Temp 97.8 F (36.6 C) (Oral)    Resp 17    SpO2 94%   Physical Exam Vitals signs and nursing note reviewed.  Constitutional:      General: He is not in acute distress.    Appearance: He is well-developed.  HENT:     Head: Normocephalic and atraumatic.  Neck:     Musculoskeletal: Neck supple.  Cardiovascular:     Rate and Rhythm: Normal rate and regular rhythm.     Heart sounds: Normal heart sounds. No murmur.  Pulmonary:     Effort: Pulmonary effort is normal. No respiratory distress.     Breath sounds: Normal breath sounds.  Musculoskeletal:     Comments: Wound to great toe. No surrounding erythema. Full ROM. NVI.   Skin:    General: Skin is warm and dry.  Neurological:     Mental Status: He is alert and oriented to person, place, and time.  Psychiatric:        Mood and Affect: Mood normal.        Thought Content: Thought content normal.      ED Treatments / Results  Labs (all labs ordered are listed, but only abnormal results are displayed) Labs Reviewed  BASIC METABOLIC PANEL - Abnormal; Notable for the following components:      Result Value   Glucose, Bld 243 (*)    BUN <5 (*)    Calcium 8.5 (*)    All other components within normal limits  CBC  WITH DIFFERENTIAL/PLATELET    EKG None  Radiology No results found.  Procedures Procedures (including critical care time)  Medications Ordered in ED Medications  HYDROcodone-acetaminophen (NORCO/VICODIN) 5-325 MG per tablet 1 tablet (1 tablet Oral Given 03/14/19 1121)     Initial Impression / Assessment and Plan / ED Course  I have reviewed the triage vital signs and the nursing notes.  Pertinent  labs & imaging results that were available during my care of the patient were reviewed by me and considered in my medical decision making (see chart for details).       Gottfried Gionfriddo is a 43 y.o. male who presents to ED for wound to the great toe.  Patient was recently hospitalized for osteomyelitis of this digit and chart was thoroughly reviewed from this encounter. He left AMA yesterday afternoon.  He has already received several days of antibiotics and it appeared, from chart review, as if plan was to transition to oral meds soon.  On my exam, he is afebrile and hemodynamically stable.  Labs reviewed and reassuring including normal white count. Wound improving per patient. I discussed case with Dr. Randol Kern, the hospitalist who was taking care of the patient the last several days.  He notes that patient was indeed going to be discharged soon. The main concern was getting him follow-up and his medications as he does not have insurance.  Dr. Randol Kern told me that if we were able to arrange close follow-up as well as medication assistance, patient would be able to be discharged and would not need further inpatient treatment.  I discussed care with case management and very much appreciate their assistance today with patient.  He has a follow-up appointment scheduled with [facility].  We have arranged for him to receive his insulin, blood pressure medication as well as 6-week course of Levaquin per medicine recommendations.  Patient would very much like to go home and feels  comfortable with this plan.  Reasons to return to the ER were discussed.  He understands that he has a follow-up appointment.  All questions answered.   Final Clinical Impressions(s) / ED Diagnoses   Final diagnoses:  Wound infection    ED Discharge Orders         Ordered    levofloxacin (LEVAQUIN) 750 MG tablet  Daily     03/14/19 1057    insulin aspart protamine- aspart (NOVOLOG MIX 70/30) (70-30) 100 UNIT/ML injection  2 times daily with meals     03/14/19 1057    lisinopril (ZESTRIL) 20 MG tablet  Daily     03/14/19 1057    Insulin Pen Needle (PEN NEEDLES) 31G X 8 MM MISC     03/14/19 1114           Chris Smith, Chris Picket, Chris Smith 03/14/19 1210    Pricilla Loveless, MD 03/14/19 1455

## 2019-03-14 NOTE — ED Notes (Signed)
Cleaned wound on pt's 1st digit of right foot, applied a wet to dry dressing.

## 2019-03-14 NOTE — Discharge Instructions (Addendum)
It was my pleasure taking care of you today!  Take medication as directed.

## 2019-03-14 NOTE — ED Notes (Signed)
PT asked this tech "how long he could go to car and sleep without being taken out". This tech stated if he left this lobby and his name is called he will be removed from our system. PT has left ama multiple times today. PT had alcohol on his breath when this tech obtained triage vitals. Stated he had left for family emergencies.

## 2019-03-16 LAB — CULTURE, BLOOD (ROUTINE X 2)
Culture: NO GROWTH
Culture: NO GROWTH
Special Requests: ADEQUATE

## 2019-03-18 NOTE — Discharge Summary (Signed)
Chris Smith, is a 43 y.o. male  DOB Nov 08, 1975  MRN [id number].  Admission date:  03/11/2019  Admitting Physician  John Giovanni, MD  Discharge Date:  03/18/2019   Primary MD  Rankins, Fanny Dance, MD  Recommendations for primary care physician for things to follow:  - Patient left AMA at night of 9/11  Admission Diagnosis  Elevated glucose [R73.09] Skin ulcer of toe of right foot with fat layer exposed (HCC) [L97.512] Sepsis, due to unspecified organism, unspecified whether acute organ dysfunction present [facility]) [A41.9]   Discharge Diagnosis  Elevated glucose [R73.09] Skin ulcer of toe of right foot with fat layer exposed (HCC) [L97.512] Sepsis, due to unspecified organism, unspecified whether acute organ dysfunction present [facility]) [A41.9]    Principal Problem:   Osteomyelitis due to type 2 diabetes mellitus (HCC) Active Problems:   Diabetic ulcer of right great toe (HCC)   Sepsis (HCC)   Hyperglycemia   Poorly controlled diabetes mellitus (HCC)      Past Medical History:  Diagnosis Date  . Acute respiratory failure (HCC) 09/2014  . Amnesia   . Aspiration pneumonia (HCC) 09/2014  . Chronic back pain   . Diabetes mellitus without complication (HCC)   . Encephalopathy 09/2014  . ETOH abuse   . Hypertension   . Ischemic hepatitis unknown  . Rhabdomyolysis unknown  . Sciatica     Past Surgical History:  Procedure Laterality Date  . APPENDECTOMY    . BACK SURGERY  2002   Lumbar       History of present illness and  Hospital Course:     Kindly see H&P for history of present illness and admission details, please review complete Labs, Consult reports and Test reports for all details in brief  HPI  from the history and physical done on the day of admission 03/11/2019  HPI: Chris Smith is a 43 y.o. male with medical history significant of chronic back pain, sciatica, type 2 diabetes,  hypertension, alcohol use, tobacco use presenting to the hospital for evaluation of right great toe wound.  Patient states over a month ago he noticed a tiny blood blister at the bottom of his right great toe.  He used nail clippers to pop the blister.  Since then he has had increasing pain and swelling in that area.  States he was seen in the emergency room about a month ago and was given a shot of a steroid and sent home.  States he was not sent home on any antibiotics.  He last saw his primary care physician 4 years ago but since then has not had a follow-up due to lack of medical insurance.  He is currently checking his blood sugars as needed and using his father's diabetes medications given to him by his stepmother.  Reports having fevers and chills at home.  Patient was very upset and stated he does not want to lose his toe. No additional history could be obtained from him.  Patient was seen for similar complaints in the ED  on 02/10/2019 and underwent I&D for an abscess underlying the right great toe ulcer.  He was treated with IV clindamycin and ciprofloxacin in the ED.  Discharged on an outpatient course of antibiotics.  ED Course: Afebrile, tachycardic.  Blood pressure elevated.  White count 12.3.  Lactic acid 2.2 >2.2.  Blood glucose 343.  Bicarb 24, anion gap 12.  Creatinine 0.6, at baseline.  Blood culture x2 pending.  UA not suggestive of infection.  SARS-CoV-2 test pending.  Chest x-ray showing no edema or consolidation, heart upper normal in size.  X-ray of right foot without evidence of acute osteomyelitis.  Showing soft tissue ulceration of the plantar medial aspect of the right great toe. Patient was started on vancomycin and ceftriaxone.  Received 2 L fluid bolus.  Hospital Course    Sepsis secondary to diabeticright great toeulcer, earlyosteomyelitis - Afebrile. White count 12.3. Initially tachycardic.Lactic acid 2.2 >2.2.MRI right foot showing area of ulceration overlying the  lateral aspect of the first toe with findings suggestive of early osteomyelitis involving the distal tuft. Reactive marrow seen throughout the remainder of the distal phalanx of the first digit. No abscess or sinus tract. -he was with IV antibiotics during hospital stay, patient was seen by orthopedic, and patient  would like to try nonoperative approach, se he wouldd would like to try to save the toe, case  discussed with ID Dr power about appropriate regimen for discharge, given his MRI showing osteomyelitis only in the distal tuft area, conservative approach with antibiotic can be tried including 6 weeks of antibiotic regimen, can be oral regimen as it only involves 1 bone, he was treated with IV regimen during hospital stay, and plan was for quinolone on discharge to finish total of 6 weeks, and if MRSA screen was positive was to be initiated on doxycycline as well, patient signed AMA 9/11 night .   Hyperglycemia, poorly controlled insulin-dependent type 2 diabetes mellitus Patient has not seen a primary care physician in 4 years. He used to check his blood glucose at home as needed and taking his father's diabetes medications. Random blood glucose 343. Labs not suggestive of DKA. -Sliding scale insulin with bedtime coverage and CBG checks. started on insulin 70/30, 10 units twice daily, as this would be more affordable option on discharge. -A1c elevated at 8.8   poorly controlled hypertension In the setting of lack of medical care outside the hospital. Not on any antihypertensives at home.  Started on lisinopril and Norvasc .  Tobacco use -Counseling -NicoDerm patch  Alcohol abuse Patient reports consuming alcohol 3-4 times a week.  He was kept on CIWA protocol during hospital stay with no evidence of withdrawals.  Discharge Condition:  Left AMA   Follow UP  Follow-up Information    [facility]. Schedule an appointment as soon as possible for a  visit.   Contact information: [address] [place] [phone]            Discharge Instructions  and  Discharge Medications     Consults: Orthopedic, discussed with ID via phone   Major procedures and Radiology Reports - PLEASE review detailed and final reports for all details, in brief -      Mr Foot Right Wo Contrast  Result Date: 03/11/2019 CLINICAL DATA:  Osteomyelitis, abscess on the right toe EXAM: MRI OF THE RIGHT FOREFOOT WITHOUT CONTRAST TECHNIQUE: Multiplanar, multisequence MR imaging of the right forefoot was performed. No intravenous contrast was  administered. COMPARISON:  Radiograph March 11, 2019 FINDINGS: Bones/Joint/Cartilage There is increased T2 signal seen throughout the distal phalanx of the first digit. There is associated T1 hypointensity seen subtly at the distal tuft of the first digit. No definite cortical destruction periosteal reaction is seen. Mild first MTP joint osteoarthritis is seen with subchondral sclerosis. Ligaments The Lisfranc ligament appears to be intact. Muscles and Tendons There is diffuse fatty atrophy of the muscles surrounding the forefoot. The tendons appear to be intact. Soft tissues Area of skin irregularity in ulceration overlying the lateral aspect of the first digit measuring 9 mm. No sinus tract or loculated fluid collection is seen. There is diffuse subcutaneous edema and skin thickening. IMPRESSION: 1. Area of ulceration seen overlying the lateral aspect of the first toe with findings suggestive of early osteomyelitis involving the distal tuft. Reactive marrow seen throughout the remainder of the distal phalanx of the first digit. No abscess or sinus tract. Electronically Signed   By: Jonna Clark M.D.   On: 03/11/2019 23:23   Dg Chest Port 1 View  Result Date: 03/11/2019 CLINICAL DATA:  Fever EXAM: PORTABLE CHEST 1 VIEW COMPARISON:  September 06, 2016 FINDINGS: Lungs are clear. Heart is upper normal  in size with pulmonary vascularity normal. No adenopathy. No bone lesions. IMPRESSION: No edema or consolidation.  Heart upper normal in size. Electronically Signed   By: Bretta Bang III M.D.   On: 03/11/2019 16:55   Dg Foot Complete Right  Result Date: 03/11/2019 CLINICAL DATA:  Right great toe ulceration EXAM: RIGHT FOOT COMPLETE - 3+ VIEW COMPARISON:  02/10/2019 FINDINGS: Soft tissue ulceration underlying the medial aspect of the right great toe along the plantar surface. No evidence of periostitis or cortical destruction to suggest acute osteomyelitis. No fracture or malalignment. Tiny plantar calcaneal spur. No radiopaque foreign body. IMPRESSION: 1. No radiographic evidence of acute osteomyelitis. 2. Soft tissue ulceration at the plantar-medial aspect of the right great toe. Electronically Signed   By: Duanne Guess M.D.   On: 03/11/2019 17:25    Micro Results     Recent Results (from the past 240 hour(s))  Blood Culture (routine x 2)     Status: None   Collection Time: 03/11/19  3:26 PM   Specimen: BLOOD  Result Value Ref Range Status   Specimen Description BLOOD RIGHT ANTECUBITAL  Final   Special Requests   Final    BOTTLES DRAWN AEROBIC AND ANAEROBIC Blood Culture results may not be optimal due to an inadequate volume of blood received in culture bottles   Culture   Final    NO GROWTH 5 DAYS Performed at [facility], [address] [place], [place] [address]    Report Status 03/16/2019 FINAL  Final  Blood Culture (routine x 2)     Status: None   Collection Time: 03/11/19  6:05 PM   Specimen: BLOOD  Result Value Ref Range Status   Specimen Description BLOOD RIGHT ANTECUBITAL  Final   Special Requests   Final    BOTTLES DRAWN AEROBIC AND ANAEROBIC Blood Culture adequate volume   Culture   Final    NO GROWTH 5 DAYS Performed at [facility], [address] [place], [place] [address]    Report Status 03/16/2019 FINAL  Final  SARS Coronavirus 2 [facility]  order, Performed in [facility] hospital lab) Nasopharyngeal Nasopharyngeal Swab     Status: None   Collection Time: 03/11/19  7:39 PM   Specimen: Nasopharyngeal  Swab  Result Value Ref Range Status   SARS Coronavirus 2 NEGATIVE NEGATIVE Final    Comment: (NOTE) If result is NEGATIVE SARS-CoV-2 target nucleic acids are NOT DETECTED. The SARS-CoV-2 RNA is generally detectable in upper and lower  respiratory specimens during the acute phase of infection. The lowest  concentration of SARS-CoV-2 viral copies this assay can detect is 250  copies / mL. A negative result does not preclude SARS-CoV-2 infection  and should not be used as the sole basis for treatment or other  patient management decisions.  A negative result may occur with  improper specimen collection / handling, submission of specimen other  than nasopharyngeal swab, presence of viral mutation(s) within the  areas targeted by this assay, and inadequate number of viral copies  (<250 copies / mL). A negative result must be combined with clinical  observations, patient history, and epidemiological information. If result is POSITIVE SARS-CoV-2 target nucleic acids are DETECTED. The SARS-CoV-2 RNA is generally detectable in upper and lower  respiratory specimens dur ing the acute phase of infection.  Positive  results are indicative of active infection with SARS-CoV-2.  Clinical  correlation with patient history and other diagnostic information is  necessary to determine patient infection status.  Positive results do  not rule out bacterial infection or co-infection with other viruses. If result is PRESUMPTIVE POSTIVE SARS-CoV-2 nucleic acids MAY BE PRESENT.   A presumptive positive result was obtained on the submitted specimen  and confirmed on repeat testing.  While 2019 novel coronavirus  (SARS-CoV-2) nucleic acids may be present in the submitted sample  additional confirmatory testing may be necessary for epidemiological  and  / or clinical management purposes  to differentiate between  SARS-CoV-2 and other Sarbecovirus currently known to infect humans.  If clinically indicated additional testing with an alternate test  methodology [phone]([id number]) is advised. The SARS-CoV-2 RNA is generally  detectable in upper and lower respiratory sp ecimens during the acute  phase of infection. The expected result is Negative. Fact Sheet for Patients:  BoilerBrush.com.cyhttps://www.fda.gov/media/136312/download Fact Sheet for Healthcare Providers: https://pope.com/https://www.fda.gov/media/136313/download This test is not yet approved or cleared by the [place] FDA and has been authorized for detection and/or diagnosis of SARS-CoV-2 by FDA under an Emergency Use Authorization (EUA).  This EUA will remain in effect (meaning this test can be used) for the duration of the COVID-19 declaration under Section 564(b)(1) of the Act, 21 U.S.C. section 360bbb-3(b)(1), unless the authorization is terminated or revoked sooner. Performed at [facility], [address] [place], [place] [address]   Urine culture     Status: None   Collection Time: 03/11/19 10:01 PM   Specimen: In/Out Cath Urine  Result Value Ref Range Status   Specimen Description IN/OUT CATH URINE  Final   Special Requests NONE  Final   Culture   Final    NO GROWTH Performed at [facility], [address] [place], [place] [address]    Report Status 03/12/2019 FINAL  Final  MRSA PCR Screening     Status: None   Collection Time: 03/13/19 11:50 AM   Specimen: Nasopharyngeal  Result Value Ref Range Status   MRSA by PCR NEGATIVE NEGATIVE Final    Comment:        The GeneXpert MRSA Assay (FDA approved for NASAL specimens only), is one component of a comprehensive MRSA colonization surveillance program. It is not intended to diagnose MRSA infection nor to guide or monitor treatment for MRSA infections.  Performed at [facility], [place] [address], [place], [place] [address]        Data Review   CBC w Diff:  Lab Results  Component Value Date   WBC 5.6 03/14/2019   HGB 13.7 03/14/2019   HCT 41.0 03/14/2019   HCT 44.1 04/28/2016   PLT 224 03/14/2019   LYMPHOPCT 38 03/14/2019   MONOPCT 10 03/14/2019   EOSPCT 2 03/14/2019   BASOPCT 1 03/14/2019    CMP:  Lab Results  Component Value Date   NA 139 03/14/2019   K 3.8 03/14/2019   CL 106 03/14/2019   CO2 23 03/14/2019   BUN <5 (L) 03/14/2019   CREATININE 0.70 03/14/2019   CREATININE 0.67 10/13/2014   PROT 7.3 03/11/2019   ALBUMIN 3.6 03/11/2019   BILITOT 0.5 03/11/2019   ALKPHOS 72 03/11/2019   AST 31 03/11/2019   ALT 40 03/11/2019  .    Phillips Climes M.D on 03/18/2019 at 7:03 AM  [facility]   Office  [phone]

## 2019-04-01 ENCOUNTER — Telehealth (INDEPENDENT_AMBULATORY_CARE_PROVIDER_SITE_OTHER): Payer: Self-pay | Admitting: Primary Care

## 2019-04-11 ENCOUNTER — Emergency Department (HOSPITAL_COMMUNITY): Payer: Self-pay

## 2019-04-11 ENCOUNTER — Encounter (HOSPITAL_COMMUNITY): Payer: Self-pay | Admitting: Emergency Medicine

## 2019-04-11 ENCOUNTER — Other Ambulatory Visit: Payer: Self-pay

## 2019-04-11 ENCOUNTER — Inpatient Hospital Stay (HOSPITAL_COMMUNITY)
Admission: EM | Admit: 2019-04-11 | Discharge: 2019-04-17 | DRG: 638 | Disposition: A | Discharge status: Home Health | Payer: Self-pay | Attending: Internal Medicine | Admitting: Internal Medicine

## 2019-04-11 DIAGNOSIS — L03031 Cellulitis of right toe: Secondary | ICD-10-CM | POA: Diagnosis present

## 2019-04-11 DIAGNOSIS — E11621 Type 2 diabetes mellitus with foot ulcer: Secondary | ICD-10-CM | POA: Diagnosis present

## 2019-04-11 DIAGNOSIS — L97509 Non-pressure chronic ulcer of other part of unspecified foot with unspecified severity: Secondary | ICD-10-CM | POA: Diagnosis present

## 2019-04-11 DIAGNOSIS — Z79899 Other long term (current) drug therapy: Secondary | ICD-10-CM

## 2019-04-11 DIAGNOSIS — E1165 Type 2 diabetes mellitus with hyperglycemia: Secondary | ICD-10-CM | POA: Diagnosis present

## 2019-04-11 DIAGNOSIS — Z20828 Contact with and (suspected) exposure to other viral communicable diseases: Secondary | ICD-10-CM | POA: Diagnosis present

## 2019-04-11 DIAGNOSIS — Z8701 Personal history of pneumonia (recurrent): Secondary | ICD-10-CM

## 2019-04-11 DIAGNOSIS — F1010 Alcohol abuse, uncomplicated: Secondary | ICD-10-CM | POA: Diagnosis present

## 2019-04-11 DIAGNOSIS — Z23 Encounter for immunization: Secondary | ICD-10-CM

## 2019-04-11 DIAGNOSIS — Z794 Long term (current) use of insulin: Secondary | ICD-10-CM

## 2019-04-11 DIAGNOSIS — E08621 Diabetes mellitus due to underlying condition with foot ulcer: Secondary | ICD-10-CM

## 2019-04-11 DIAGNOSIS — E11628 Type 2 diabetes mellitus with other skin complications: Secondary | ICD-10-CM | POA: Diagnosis present

## 2019-04-11 DIAGNOSIS — M549 Dorsalgia, unspecified: Secondary | ICD-10-CM | POA: Diagnosis present

## 2019-04-11 DIAGNOSIS — I10 Essential (primary) hypertension: Secondary | ICD-10-CM | POA: Diagnosis present

## 2019-04-11 DIAGNOSIS — E1142 Type 2 diabetes mellitus with diabetic polyneuropathy: Secondary | ICD-10-CM | POA: Diagnosis present

## 2019-04-11 DIAGNOSIS — E1169 Type 2 diabetes mellitus with other specified complication: Principal | ICD-10-CM | POA: Diagnosis present

## 2019-04-11 DIAGNOSIS — Z833 Family history of diabetes mellitus: Secondary | ICD-10-CM

## 2019-04-11 DIAGNOSIS — Z8249 Family history of ischemic heart disease and other diseases of the circulatory system: Secondary | ICD-10-CM

## 2019-04-11 DIAGNOSIS — M869 Osteomyelitis, unspecified: Secondary | ICD-10-CM | POA: Diagnosis present

## 2019-04-11 DIAGNOSIS — G8929 Other chronic pain: Secondary | ICD-10-CM | POA: Diagnosis present

## 2019-04-11 DIAGNOSIS — L97519 Non-pressure chronic ulcer of other part of right foot with unspecified severity: Secondary | ICD-10-CM | POA: Diagnosis present

## 2019-04-11 DIAGNOSIS — F17210 Nicotine dependence, cigarettes, uncomplicated: Secondary | ICD-10-CM | POA: Diagnosis present

## 2019-04-11 LAB — COMPREHENSIVE METABOLIC PANEL
ALT: 33 U/L (ref 0–44)
AST: 48 U/L — ABNORMAL HIGH (ref 15–41)
Albumin: 3.5 g/dL (ref 3.5–5.0)
Alkaline Phosphatase: 71 U/L (ref 38–126)
Anion gap: 10 (ref 5–15)
BUN: <5 mg/dL — ABNORMAL LOW (ref 6–20)
CO2: 28 mmol/L (ref 22–32)
Calcium: 8.8 mg/dL — ABNORMAL LOW (ref 8.9–10.3)
Chloride: 101 mmol/L (ref 98–111)
Creatinine, Ser: 0.73 mg/dL (ref 0.61–1.24)
GFR calc Af Amer: >60 mL/min (ref >60)
GFR calc non Af Amer: >60 mL/min (ref >60)
Glucose, Bld: 294 mg/dL — ABNORMAL HIGH (ref 70–99)
Potassium: 3.9 mmol/L (ref 3.5–5.1)
Sodium: 139 mmol/L (ref 135–145)
Total Bilirubin: 0.4 mg/dL (ref 0.3–1.2)
Total Protein: 6.9 g/dL (ref 6.5–8.1)

## 2019-04-11 LAB — CBC WITH DIFFERENTIAL/PLATELET
Abs Immature Granulocytes: 0.03 10*3/uL (ref 0.00–0.07)
Basophils Absolute: 0.1 10*3/uL (ref 0.0–0.1)
Basophils Relative: 1 %
Eosinophils Absolute: 0.1 10*3/uL (ref 0.0–0.5)
Eosinophils Relative: 1 %
HCT: 51 % (ref 39.0–52.0)
Hemoglobin: 16.5 g/dL (ref 13.0–17.0)
Immature Granulocytes: 0 %
Lymphocytes Relative: 37 %
Lymphs Abs: 3.5 10*3/uL (ref 0.7–4.0)
MCH: 30.3 pg (ref 26.0–34.0)
MCHC: 32.4 g/dL (ref 30.0–36.0)
MCV: 93.8 fL (ref 80.0–100.0)
Monocytes Absolute: 0.7 10*3/uL (ref 0.1–1.0)
Monocytes Relative: 8 %
Neutro Abs: 4.9 10*3/uL (ref 1.7–7.7)
Neutrophils Relative %: 53 %
Platelets: 235 10*3/uL (ref 150–400)
RBC: 5.44 MIL/uL (ref 4.22–5.81)
RDW: 13.2 % (ref 11.5–15.5)
WBC: 9.3 10*3/uL (ref 4.0–10.5)
nRBC: 0 % (ref 0.0–0.2)

## 2019-04-11 LAB — CBG MONITORING, ED: Glucose-Capillary: 231 mg/dL — ABNORMAL HIGH (ref 70–99)

## 2019-04-11 LAB — LACTIC ACID, PLASMA
Lactic Acid, Venous: 1.7 mmol/L (ref 0.5–1.9)
Lactic Acid, Venous: 1.6 mmol/L (ref 0.5–1.9)

## 2019-04-11 LAB — GLUCOSE, CAPILLARY
Glucose-Capillary: 226 mg/dL — ABNORMAL HIGH (ref 70–99)
Glucose-Capillary: 251 mg/dL — ABNORMAL HIGH (ref 70–99)

## 2019-04-11 LAB — SARS CORONAVIRUS 2 (TAT 6-24 HRS): SARS Coronavirus 2: NEGATIVE

## 2019-04-11 MED ORDER — SODIUM CHLORIDE 0.9% FLUSH
3.0000 mL | Freq: Two times a day (BID) | INTRAVENOUS | Status: DC
  Administered 2019-04-12 – 2019-04-16 (×5): 3 mL via INTRAVENOUS

## 2019-04-11 MED ORDER — VANCOMYCIN HCL IN DEXTROSE 1-5 GM/200ML-% IV SOLN
1000.0000 mg | Freq: Once | INTRAVENOUS | Status: AC
  Administered 2019-04-11: 1000 mg via INTRAVENOUS
  Filled 2019-04-11: qty 200

## 2019-04-11 MED ORDER — SODIUM CHLORIDE 0.9 % IV SOLN
250.0000 mL | INTRAVENOUS | Status: DC | PRN
  Administered 2019-04-15: 250 mL via INTRAVENOUS

## 2019-04-11 MED ORDER — PIPERACILLIN-TAZOBACTAM 3.375 G IVPB 30 MIN
3.3750 g | Freq: Once | INTRAVENOUS | Status: AC
  Administered 2019-04-11: 3.375 g via INTRAVENOUS
  Filled 2019-04-11: qty 50

## 2019-04-11 MED ORDER — VANCOMYCIN HCL 10 G IV SOLR
1500.0000 mg | Freq: Once | INTRAVENOUS | Status: AC
  Administered 2019-04-11: 1500 mg via INTRAVENOUS
  Filled 2019-04-11: qty 1500

## 2019-04-11 MED ORDER — ONDANSETRON HCL 4 MG/2ML IJ SOLN
4.0000 mg | Freq: Four times a day (QID) | INTRAMUSCULAR | Status: DC | PRN
  Filled 2019-04-11: qty 2

## 2019-04-11 MED ORDER — INSULIN ASPART 100 UNIT/ML ~~LOC~~ SOLN
0.0000 [IU] | Freq: Every day | SUBCUTANEOUS | Status: DC
  Administered 2019-04-11 – 2019-04-14 (×3): 3 [IU] via SUBCUTANEOUS

## 2019-04-11 MED ORDER — PIPERACILLIN-TAZOBACTAM 3.375 G IVPB
3.3750 g | Freq: Three times a day (TID) | INTRAVENOUS | Status: DC
  Administered 2019-04-11 – 2019-04-15 (×11): 3.375 g via INTRAVENOUS
  Filled 2019-04-11 (×10): qty 50

## 2019-04-11 MED ORDER — ONDANSETRON HCL 4 MG PO TABS: 4.0000 mg | ORAL_TABLET | Freq: Four times a day (QID) | ORAL | Status: DC | PRN

## 2019-04-11 MED ORDER — INSULIN ASPART PROT & ASPART (70-30 MIX) 100 UNIT/ML ~~LOC~~ SUSP
10.0000 [IU] | Freq: Two times a day (BID) | SUBCUTANEOUS | Status: DC
  Administered 2019-04-11 – 2019-04-13 (×4): 10 [IU] via SUBCUTANEOUS
  Filled 2019-04-11 (×2): qty 10

## 2019-04-11 MED ORDER — ACETAMINOPHEN 325 MG PO TABS
650.0000 mg | ORAL_TABLET | Freq: Four times a day (QID) | ORAL | Status: DC | PRN
  Administered 2019-04-11: 650 mg via ORAL
  Filled 2019-04-11 (×2): qty 2

## 2019-04-11 MED ORDER — ENOXAPARIN SODIUM 40 MG/0.4ML ~~LOC~~ SOLN
40.0000 mg | SUBCUTANEOUS | Status: DC
  Administered 2019-04-11 – 2019-04-16 (×6): 40 mg via SUBCUTANEOUS
  Filled 2019-04-11 (×6): qty 0.4

## 2019-04-11 MED ORDER — VANCOMYCIN HCL 10 G IV SOLR
2000.0000 mg | Freq: Two times a day (BID) | INTRAVENOUS | Status: DC
  Administered 2019-04-12 – 2019-04-15 (×7): 2000 mg via INTRAVENOUS
  Filled 2019-04-11 (×8): qty 2000

## 2019-04-11 MED ORDER — INSULIN ASPART 100 UNIT/ML ~~LOC~~ SOLN
0.0000 [IU] | Freq: Three times a day (TID) | SUBCUTANEOUS | Status: DC
  Administered 2019-04-12: 2 [IU] via SUBCUTANEOUS
  Administered 2019-04-13: 1 [IU] via SUBCUTANEOUS
  Administered 2019-04-13: 5 [IU] via SUBCUTANEOUS
  Administered 2019-04-13: 3 [IU] via SUBCUTANEOUS
  Administered 2019-04-14: 5 [IU] via SUBCUTANEOUS
  Administered 2019-04-14: 3 [IU] via SUBCUTANEOUS
  Administered 2019-04-15: 1 [IU] via SUBCUTANEOUS
  Administered 2019-04-16: 2 [IU] via SUBCUTANEOUS
  Administered 2019-04-16: 1 [IU] via SUBCUTANEOUS
  Administered 2019-04-17: 2 [IU] via SUBCUTANEOUS

## 2019-04-11 MED ORDER — MORPHINE SULFATE (PF) 2 MG/ML IV SOLN
2.0000 mg | INTRAVENOUS | Status: DC | PRN
  Administered 2019-04-11: 2 mg via INTRAVENOUS
  Filled 2019-04-11: qty 1

## 2019-04-11 MED ORDER — LISINOPRIL 20 MG PO TABS
20.0000 mg | ORAL_TABLET | Freq: Every day | ORAL | Status: DC
  Administered 2019-04-12 – 2019-04-15 (×4): 20 mg via ORAL
  Filled 2019-04-11 (×4): qty 1

## 2019-04-11 MED ORDER — SODIUM CHLORIDE 0.9% FLUSH
3.0000 mL | INTRAVENOUS | Status: DC | PRN
  Administered 2019-04-16: 3 mL via INTRAVENOUS
  Filled 2019-04-11: qty 3

## 2019-04-11 MED ORDER — HYDROCODONE-ACETAMINOPHEN 5-325 MG PO TABS
1.0000 | ORAL_TABLET | ORAL | Status: DC | PRN
  Administered 2019-04-11 – 2019-04-17 (×21): 1 via ORAL
  Filled 2019-04-11 (×21): qty 1

## 2019-04-11 MED ORDER — INSULIN GLARGINE 100 UNIT/ML ~~LOC~~ SOLN
20.0000 [IU] | Freq: Every day | SUBCUTANEOUS | Status: DC
  Administered 2019-04-11 – 2019-04-12 (×2): 20 [IU] via SUBCUTANEOUS
  Filled 2019-04-11 (×3): qty 0.2

## 2019-04-11 MED ORDER — OXYCODONE-ACETAMINOPHEN 5-325 MG PO TABS
2.0000 | ORAL_TABLET | Freq: Once | ORAL | Status: AC
  Administered 2019-04-11: 2 via ORAL
  Filled 2019-04-11: qty 2

## 2019-04-11 NOTE — ED Notes (Signed)
ED TO INPATIENT HANDOFF REPORT  ED Nurse Name and Phone #: Mickeal Skinner [phone]  S Name/Age/Gender Chris Smith 43 y.o. male Room/Bed: 026C/026C  Code Status   Code Status: Prior  Home/SNF/Other Home Patient oriented to: self, place, time and situation Is this baseline? Yes   Triage Complete: Triage complete  Chief Complaint wound check  Triage Note Pt reports diabetic ulcer to R great toe that he has an appt on the 9th to see a doctor for possible hyperbaric therapy, states he now has more sores on the bottom of his foot that are painful. Hx of DM, reports blood sugars are well maintained. Reports he does not have insurance, no pcp and has been using insulin that he got from his father after he passed away.    Allergies No Known Allergies  Level of Care/Admitting Diagnosis ED Disposition    ED Disposition Condition Comment   Admit  Hospital Area: [facility] [[id number]]  Level of Care: Med-Surg [16]  Covid Evaluation: Asymptomatic Screening Protocol (No Symptoms)  Diagnosis: Diabetic foot ulcer (HCC) [176160]  Admitting Physician: Carron Curie Bai.Lain  Attending Physician: Sharyon Medicus, ALI Bai.Lain  Estimated length of stay: past midnight tomorrow  Certification:: I certify this patient will need inpatient services for at least 2 midnights  PT Class (Do Not Modify): Inpatient [101]  PT Acc Code (Do Not Modify): Private [1]       B Medical/Surgery History Past Medical History:  Diagnosis Date  . Acute respiratory failure (HCC) 09/2014  . Amnesia   . Aspiration pneumonia (HCC) 09/2014  . Chronic back pain   . Diabetes mellitus without complication (HCC)   . Encephalopathy 09/2014  . ETOH abuse   . Hypertension   . Ischemic hepatitis unknown  . Rhabdomyolysis unknown  . Sciatica    Past Surgical History:  Procedure Laterality Date  . APPENDECTOMY    . BACK SURGERY  2002   Lumbar     A IV Location/Drains/Wounds Patient Lines/Drains/Airways Status    Active Line/Drains/Airways    Name:   Placement date:   Placement time:   Site:   Days:   Peripheral IV 03/11/19 Left Antecubital   03/11/19    1722    Antecubital   31   Peripheral IV 03/14/19 Left;Posterior Forearm   03/14/19    0836    Forearm   28   Peripheral IV 04/11/19 Left Forearm   04/11/19    1530    Forearm   less than 1   Peripheral IV 04/11/19 Right Antecubital   04/11/19    1525    Antecubital   less than 1   Pressure Injury 03/14/19 Toe (Comment  which one) Right Stage II -  Partial thickness loss of dermis presenting as a shallow open ulcer with a red, pink wound bed without slough.   03/14/19    0821     28   Wound / Incision (Open or Dehisced) 03/11/19 Diabetic ulcer Toe (Comment  which one)   03/11/19    2330    Toe (Comment  which one)   31          Intake/Output Last 24 hours  Intake/Output Summary (Last 24 hours) at 04/11/2019 1728 Last data filed at 04/11/2019 1717 Gross per 24 hour  Intake 328.32 ml  Output -  Net 328.32 ml    Labs/Imaging Results for orders placed or performed during the hospital encounter of 04/11/19 (from the past 48 hour(s))  Comprehensive metabolic  panel     Status: Abnormal   Collection Time: 04/11/19 11:45 AM  Result Value Ref Range   Sodium 139 135 - 145 mmol/L   Potassium 3.9 3.5 - 5.1 mmol/L   Chloride 101 98 - 111 mmol/L   CO2 28 22 - 32 mmol/L   Glucose, Bld 294 (H) 70 - 99 mg/dL   BUN <5 (L) 6 - 20 mg/dL   Creatinine, Ser 1.610.73 0.61 - 1.24 mg/dL   Calcium 8.8 (L) 8.9 - 10.3 mg/dL   Total Protein 6.9 6.5 - 8.1 g/dL   Albumin 3.5 3.5 - 5.0 g/dL   AST 48 (H) 15 - 41 U/L   ALT 33 0 - 44 U/L   Alkaline Phosphatase 71 38 - 126 U/L   Total Bilirubin 0.4 0.3 - 1.2 mg/dL   GFR calc non Af Amer >60 >60 mL/min   GFR calc Af Amer >60 >60 mL/min   Anion gap 10 5 - 15    Comment: Performed at [facility], [address] [place], [place] [address]  CBC with Differential     Status: None   Collection Time: 04/11/19 11:45 AM   Result Value Ref Range   WBC 9.3 4.0 - 10.5 K/uL   RBC 5.44 4.22 - 5.81 MIL/uL   Hemoglobin 16.5 13.0 - 17.0 g/dL   HCT 54.051.0 98.139.0 - 19.152.0 %   MCV 93.8 80.0 - 100.0 fL   MCH 30.3 26.0 - 34.0 pg   MCHC 32.4 30.0 - 36.0 g/dL   RDW 47.813.2 29.511.5 - 62.115.5 %   Platelets 235 150 - 400 K/uL   nRBC 0.0 0.0 - 0.2 %   Neutrophils Relative % 53 %   Neutro Abs 4.9 1.7 - 7.7 K/uL   Lymphocytes Relative 37 %   Lymphs Abs 3.5 0.7 - 4.0 K/uL   Monocytes Relative 8 %   Monocytes Absolute 0.7 0.1 - 1.0 K/uL   Eosinophils Relative 1 %   Eosinophils Absolute 0.1 0.0 - 0.5 K/uL   Basophils Relative 1 %   Basophils Absolute 0.1 0.0 - 0.1 K/uL   Immature Granulocytes 0 %   Abs Immature Granulocytes 0.03 0.00 - 0.07 K/uL    Comment: Performed at [facility], [address] [place], [place] [address]  CBG monitoring, ED     Status: Abnormal   Collection Time: 04/11/19  2:10 PM  Result Value Ref Range   Glucose-Capillary 231 (H) 70 - 99 mg/dL  Lactic acid, plasma     Status: None   Collection Time: 04/11/19  3:40 PM  Result Value Ref Range   Lactic Acid, Venous 1.7 0.5 - 1.9 mmol/L    Comment: Performed at [facility], [address] [place], [place] [address]   Dg Toe Great Left  Result Date: 04/11/2019 CLINICAL DATA:  Pt has diabetic ulcer on right great toe. C/o having no feeling in any of his toes on either foot for quite some time. Family hx of diabetes. EXAM: LEFT GREAT TOE COMPARISON:  None. FINDINGS: Ulcerated lesion on the inferomedial aspect of the right great toe measuring approximately 1.8 cm. On the frontal view the deep aspect of the ulcer appears to abut the cortex of the distal phalanx. No definite bony destruction identified. No other acute finding in the visualized skeleton. IMPRESSION: Ulcerated lesion on the inferomedial aspect of the right great toe. On the frontal view the deep aspect of the ulcer appears to abut the  cortex of the distal phalanx. No definite bony  destruction. Electronically Signed   By: Audie Pinto M.D.   On: 04/11/2019 15:30    Pending Labs Unresulted Labs (From admission, onward)    Start     Ordered   04/11/19 1536  SARS CORONAVIRUS 2 (TAT 6-24 HRS) Nasopharyngeal Nasopharyngeal Swab  (Asymptomatic/Tier 2 Patients Labs)  Once,   STAT    Question Answer Comment  Is this test for diagnosis or screening Screening   Symptomatic for COVID-19 as defined by CDC No   Hospitalized for COVID-19 No   Admitted to ICU for COVID-19 No   Previously tested for COVID-19 Yes   Resident in a congregate (group) care setting No   Employed in healthcare setting No      04/11/19 1535   04/11/19 1435  Lactic acid, plasma  Now then every 2 hours,   STAT     04/11/19 1435   04/11/19 1435  Culture, blood (routine x 2)  BLOOD CULTURE X 2,   STAT    Question:  Patient immune status  Answer:  Immunocompromised   04/11/19 1435   04/11/19 1434  Wound or Superficial Culture  Once,   STAT    Question:  Patient immune status  Answer:  Immunocompromised   04/11/19 1435          Vitals/Pain Today's Vitals   04/11/19 1042 04/11/19 1135  BP: 123/84   Pulse: (!) 108   Resp: 20   Temp: 98.4 F (36.9 C)   TempSrc: Oral   SpO2: 99%   PainSc:  10-Worst pain ever    Isolation Precautions No active isolations  Medications Medications  piperacillin-tazobactam (ZOSYN) IVPB 3.375 g (has no administration in time range)  vancomycin (VANCOCIN) 1,500 mg in sodium chloride 0.9 % 500 mL IVPB (has no administration in time range)  vancomycin (VANCOCIN) 2,000 mg in sodium chloride 0.9 % 500 mL IVPB (has no administration in time range)  piperacillin-tazobactam (ZOSYN) IVPB 3.375 g (0 g Intravenous Stopped 04/11/19 1716)  vancomycin (VANCOCIN) IVPB 1000 mg/200 mL premix (0 mg Intravenous Stopped 04/11/19 1717)  oxyCODONE-acetaminophen (PERCOCET/ROXICET) 5-325 MG per tablet 2 tablet (2 tablets Oral Given 04/11/19 1554)    Mobility walks Low fall risk    Focused Assessments    R Recommendations: See Admitting Provider Note  Report given to:   Additional Notes:

## 2019-04-11 NOTE — ED Provider Notes (Signed)
[facility] EMERGENCY DEPARTMENT Provider Note   CSN: [id number] Arrival date & time: 04/11/19  1035     History   Chief Complaint Chief Complaint  Patient presents with  . Diabetic Ulcer    HPI Chris Smith is a 43 y.o. male.     HPI Patient was admitted to hospital almost exactly 1 month ago for infection of his left great toe.  He is diabetic.  At that time, the chart indicates a skin ulcer with fat layer exposed.  Patient was then subsequently seen at the emergency department and put on 3 weeks of Levaquin.  He reports he was supposed to try to get seen at the hyperbaric wound center but there were no appointments available until next month.  He reports in the meantime he has not had other follow-up.  He reports that the wound has expanded in area.  Now there are areas of breakdown at the base of the toe and a larger area of wound over the base of the toe.  He reports it is throbbing in quality.  He denies he is had fever or chills.  He reports one time he vomited for no reason.  He is otherwise not had cough, shortness of breath or chest pain.  Patient reports that he is taking Lantus.  He reports that he is about to run out of it and cannot financially afford more.  He reports the Lantus he has right now is from his fathr who died.  Past Medical History:  Diagnosis Date  . Acute respiratory failure (HCC) 09/2014  . Amnesia   . Aspiration pneumonia (HCC) 09/2014  . Chronic back pain   . Diabetes mellitus without complication (HCC)   . Encephalopathy 09/2014  . ETOH abuse   . Hypertension   . Ischemic hepatitis unknown  . Rhabdomyolysis unknown  . Sciatica     Patient Active Problem List   Diagnosis Date Noted  . Osteomyelitis due to type 2 diabetes mellitus (HCC) 03/12/2019  . Sepsis (HCC) 03/12/2019  . Hyperglycemia 03/12/2019  . Poorly controlled diabetes mellitus (HCC) 03/12/2019  . Diabetic ulcer of right great toe (HCC) 03/11/2019  . Dehydration    . Nausea with vomiting 09/07/2016  . Hypokalemia 09/07/2016  . Abdominal pain 04/28/2016  . Enteritis 04/28/2016  . Acidosis 04/28/2016  . Prolonged Q-T interval on ECG 04/28/2016  . Hypomagnesemia 04/28/2016  . ETOH abuse 04/28/2016  . Abdominal pain, bilateral upper quadrant 04/28/2016  . Tachycardia 04/28/2016  . Tobacco abuse 04/28/2016  . Obesity 04/28/2016  . Fatty liver 04/28/2016  . Chronic back pain 04/28/2016  . Memory loss 10/25/2014  . Depression 10/25/2014  . Diabetic mononeuropathy associated with diabetes mellitus due to underlying condition (HCC) 10/25/2014  . Amnesia 10/13/2014  . Hypertension 10/13/2014  . Diabetes mellitus without complication (HCC) 10/13/2014  . Lumbago 10/13/2014    Past Surgical History:  Procedure Laterality Date  . APPENDECTOMY    . BACK SURGERY  2002   Lumbar        Home Medications    Prior to Admission medications   Medication Sig Start Date End Date Taking? Authorizing Provider  ciprofloxacin (CIPRO) 500 MG tablet Take 1 tablet (500 mg total) by mouth 2 (two) times daily. Patient not taking: Reported on 03/11/2019 02/10/19   Gerhard Munch, MD  ibuprofen (ADVIL) 200 MG tablet Take 400 mg by mouth every 6 (six) hours as needed for headache or mild pain.    [provider]  insulin aspart protamine- aspart (NOVOLOG MIX 70/30) (70-30) 100 UNIT/ML injection Inject 0.1 mLs (10 Units total) into the skin 2 (two) times daily with a meal. 03/14/19   Ward, Chase PicketJaime Pilcher, PA-C  insulin glargine (LANTUS) 100 UNIT/ML injection Inject 20-75 Units into the skin See admin instructions. Inject 20-75 units into the skin one to two times a day, per sliding scale    [provider]  Insulin Pen Needle (PEN NEEDLES) 31G X 8 MM MISC Use as directed 03/14/19   Ward, Chase PicketJaime Pilcher, PA-C  levofloxacin (LEVAQUIN) 750 MG tablet Take 1 tablet (750 mg total) by mouth daily. X 6 weeks 03/14/19   Ward, Chase PicketJaime Pilcher, PA-C  lisinopril (ZESTRIL)  20 MG tablet Take 1 tablet (20 mg total) by mouth daily. 03/14/19   Ward, Chase PicketJaime Pilcher, PA-C    Family History Family History  Problem Relation Age of Onset  . Cancer Mother        cause of death, ovarian & colon  . Diabetes Father   . Hypertension Father   . Gout Father   . Hyperlipidemia Father     Social History Social History   Tobacco Use  . Smoking status: Current Every Day Smoker    Packs/day: 0.50    Years: 18.00    Pack years: 9.00    Types: Cigarettes  . Smokeless tobacco: Never Used  Substance Use Topics  . Alcohol use: Yes    Alcohol/week: 0.0 standard drinks    Comment: 3 OR 4 DAYS PER WEEK  . Drug use: No     Allergies   Patient has no known allergies.   Review of Systems Review of Systems 10 Systems reviewed and are negative for acute change except as noted in the HPI.   Physical Exam Updated Vital Signs BP 123/84 (BP Location: Right Arm)   Pulse (!) 108   Temp 98.4 F (36.9 C) (Oral)   Resp 20   SpO2 99%   Physical Exam Constitutional:      Appearance: Normal appearance.  HENT:     Head: Normocephalic and atraumatic.  Eyes:     Extraocular Movements: Extraocular movements intact.  Cardiovascular:     Rate and Rhythm: Normal rate and regular rhythm.  Pulmonary:     Effort: Pulmonary effort is normal.     Breath sounds: Normal breath sounds.  Abdominal:     General: There is no distension.     Palpations: Abdomen is soft.     Tenderness: There is no abdominal tenderness.  Musculoskeletal:     Comments: Great toe on the left has a deep eroding ulcer.  Surrounding this is blistering and hemorrhagic tissue.  There is erythema around the metatarsal.  See attached images.  No significant peripheral edema of the lower extremities calves soft and nontender.  Skin:    General: Skin is warm and dry.  Neurological:     General: No focal deficit present.     Mental Status: He is alert and oriented to person, place, and time.      Coordination: Coordination normal.  Psychiatric:        Mood and Affect: Mood normal.          ED Treatments / Results  Labs (all labs ordered are listed, but only abnormal results are displayed) Labs Reviewed  COMPREHENSIVE METABOLIC PANEL - Abnormal; Notable for the following components:      Result Value   Glucose, Bld 294 (*)    BUN <  5 (*)    Calcium 8.8 (*)    AST 48 (*)    All other components within normal limits  CBG MONITORING, ED - Abnormal; Notable for the following components:   Glucose-Capillary 231 (*)    All other components within normal limits  AEROBIC CULTURE (SUPERFICIAL SPECIMEN)  CULTURE, BLOOD (ROUTINE X 2)  CULTURE, BLOOD (ROUTINE X 2)  CBC WITH DIFFERENTIAL/PLATELET  LACTIC ACID, PLASMA  LACTIC ACID, PLASMA    EKG None  Radiology Dg Toe Great Left  Result Date: 04/11/2019 CLINICAL DATA:  Pt has diabetic ulcer on right great toe. C/o having no feeling in any of his toes on either foot for quite some time. Family hx of diabetes. EXAM: LEFT GREAT TOE COMPARISON:  None. FINDINGS: Ulcerated lesion on the inferomedial aspect of the right great toe measuring approximately 1.8 cm. On the frontal view the deep aspect of the ulcer appears to abut the cortex of the distal phalanx. No definite bony destruction identified. No other acute finding in the visualized skeleton. IMPRESSION: Ulcerated lesion on the inferomedial aspect of the right great toe. On the frontal view the deep aspect of the ulcer appears to abut the cortex of the distal phalanx. No definite bony destruction. Electronically Signed   By: Audie Pinto M.D.   On: 04/11/2019 15:30    Procedures Procedures (including critical care time)  Medications Ordered in ED Medications  piperacillin-tazobactam (ZOSYN) IVPB 3.375 g (has no administration in time range)  vancomycin (VANCOCIN) IVPB 1000 mg/200 mL premix (has no administration in time range)     Initial Impression / Assessment and  Plan / ED Course  I have reviewed the triage vital signs and the nursing notes.  Pertinent labs & imaging results that were available during my care of the patient were reviewed by me and considered in my medical decision making (see chart for details).       Patient has ulcer of the great toe that appears to have worsened significantly over the past month.  He had had outpatient treatment with antibiotics.  Now the wound has expanded and deepened.  He appears to have erythema extending down over the metatarsal joint.  Cultures obtained, antibiotics initiated.  Anticipate admission for IV antibiotics and diabetic foot wound care.  Patient appears to have significant barriers to consistent outpatient care.  He reports he is uninsured until January.  He reports he will be running out of his Lantus soon.  He has not been able to get into the hyperbaric wound care center for outpatient wound care.  Patient has not had additional wound care since his hospitalization.  He reports he was compliant with the antibiotics as prescribed but the wound has been advancing.  Final Clinical Impressions(s) / ED Diagnoses   Final diagnoses:  Diabetic foot infection [facility])    ED Discharge Orders    None       Charlesetta Shanks, MD 04/11/19 1535

## 2019-04-11 NOTE — H&P (Signed)
[facility]                                                                                    Patient Demographics  Chris Smith, is a 43 y.o. male  CSN: [id number]  MRN: [id number]  DOB - 1975-12-16  Admit Date - 04/11/2019  Outpatient Primary MD for the patient is Rankins, Bill Salinas, MD   With History of -  Past Medical History:  Diagnosis Date  . Acute respiratory failure ([place]) 09/2014  . Amnesia   . Aspiration pneumonia ([place]) 09/2014  . Chronic back pain   . Diabetes mellitus without complication ([place])   . Encephalopathy 09/2014  . ETOH abuse   . Hypertension   . Ischemic hepatitis unknown  . Rhabdomyolysis unknown  . Sciatica       Past Surgical History:  Procedure Laterality Date  . APPENDECTOMY    . BACK SURGERY  2002   Lumbar    in for   Chief Complaint  Patient presents with  . Diabetic Ulcer     HPI  Chris Smith  is a 43 y.o. male, past medical history significant for diabetes presenting with persistent ulcer in his left first toe .  No fever or chills. The patient was admitted on 9/8 with the same problem, he left AMA and was sent home on p.o. antibiotics that he reports that he took.  Patient was also admitted for similar complaints on 02/10/2019 and underwent I&D of an abscess underlying the right great toe and treated with IV Clinda and Cipro and discharged on outpatient course of antibiotics. The patient reports that he tried to get seen at the hyperbaric wound center but there were no appointments available until next month.  He reports that the wound started getting bigger but no fever/chills, no nausea.  No chest pains or shortness of breath.    Review of Systems    In addition to the HPI above, No Fever-chills, No Headache, No changes with Vision or hearing, No problems swallowing food or Liquids, No Chest pain, Cough or Shortness of Breath, No Abdominal pain, No Nausea or Vommitting, Bowel movements are regular, No Blood in  stool or Urine, No dysuria, No new skin rashes or bruises,  No new weakness, tingling, numbness in any extremity, No recent weight gain or loss, No polyuria, polydypsia or polyphagia, No significant Mental Stressors.  A full 10 point Review of Systems was done, except as stated above, all other Review of Systems were negative.   Social History Social History   Tobacco Use  . Smoking status: Current Every Day Smoker    Packs/day: 0.50    Years: 18.00    Pack years: 9.00    Types: Cigarettes  . Smokeless tobacco: Never Used  Substance Use Topics  . Alcohol use: Yes    Alcohol/week: 0.0 standard drinks    Comment: 3 OR 4 DAYS PER WEEK     Family History Family History  Problem Relation Age of Onset  . Cancer Mother        cause of death, ovarian & colon  . Diabetes Father   . Hypertension Father   .  Gout Father   . Hyperlipidemia Father      Prior to Admission medications   Medication Sig Start Date End Date Taking? Authorizing Provider  acetaminophen (TYLENOL) 325 MG tablet Take 650 mg by mouth every 6 (six) hours as needed for moderate pain.   Yes [provider]  ibuprofen (ADVIL) 200 MG tablet Take 400 mg by mouth every 6 (six) hours as needed for headache or mild pain.   Yes [provider]  insulin aspart protamine- aspart (NOVOLOG MIX 70/30) (70-30) 100 UNIT/ML injection Inject 0.1 mLs (10 Units total) into the skin 2 (two) times daily with a meal. 03/14/19  Yes Ward, Chase PicketJaime Pilcher, PA-C  insulin glargine (LANTUS) 100 UNIT/ML injection Inject 20-75 Units into the skin See admin instructions. Inject 20-75 units into the skin one to two times a day, per sliding scale   Yes [provider]  Insulin Pen Needle (PEN NEEDLES) 31G X 8 MM MISC Use as directed 03/14/19  Yes Ward, Chase PicketJaime Pilcher, PA-C  levofloxacin (LEVAQUIN) 750 MG tablet Take 1 tablet (750 mg total) by mouth daily. X 6 weeks 03/14/19  Yes Ward, Chase PicketJaime Pilcher, PA-C  lisinopril  (ZESTRIL) 20 MG tablet Take 1 tablet (20 mg total) by mouth daily. 03/14/19  Yes Ward, Chase PicketJaime Pilcher, PA-C  Multiple Vitamins-Minerals (MULTIVITAMIN ADULT PO) Take 1 tablet by mouth daily.   Yes [provider]    No Known Allergies  Physical Exam  Vitals  Blood pressure 123/84, pulse (!) 108, temperature 98.4 F (36.9 C), temperature source Oral, resp. rate 20, SpO2 99 %.   General appearance no acute distress, well-developed, well-nourished HEENT no jaundice or pallor, no facial deviation no oral thrush Neck supple, no neck vein distention Chest clear and resonant Heart normal S1-S2, no murmurs gallops or rubs Abdomen soft, nontender, bowel sounds are present Extremities no clubbing cyanosis or edema left first Corotto ulcer, dime size, no significant redness noted, mild discharge Skin no rashes or ulcers   Data Review  CBC Recent Labs  Lab 04/11/19 1145  WBC 9.3  HGB 16.5  HCT 51.0  PLT 235  MCV 93.8  MCH 30.3  MCHC 32.4  RDW 13.2  LYMPHSABS 3.5  MONOABS 0.7  EOSABS 0.1  BASOSABS 0.1   ------------------------------------------------------------------------------------------------------------------  Chemistries  Recent Labs  Lab 04/11/19 1145  NA 139  K 3.9  CL 101  CO2 28  GLUCOSE 294*  BUN <5*  CREATININE 0.73  CALCIUM 8.8*  AST 48*  ALT 33  ALKPHOS 71  BILITOT 0.4   ------------------------------------------------------------------------------------------------------------------ CrCl cannot be calculated (Unknown ideal weight.). ------------------------------------------------------------------------------------------------------------------ No results for input(s): TSH, T4TOTAL, T3FREE, THYROIDAB in the last 72 hours.  Invalid input(s): FREET3   Coagulation profile No results for input(s): INR, PROTIME in the last 168  hours. ------------------------------------------------------------------------------------------------------------------- No results for input(s): DDIMER in the last 72 hours. -------------------------------------------------------------------------------------------------------------------  Cardiac Enzymes No results for input(s): CKMB, TROPONINI, MYOGLOBIN in the last 168 hours.  Invalid input(s): CK ------------------------------------------------------------------------------------------------------------------ Invalid input(s): POCBNP   ---------------------------------------------------------------------------------------------------------------  Urinalysis    Component Value Date/Time   COLORURINE STRAW (A) 03/11/2019 1525   APPEARANCEUR CLEAR 03/11/2019 1525   LABSPEC 1.024 03/11/2019 1525   PHURINE 7.0 03/11/2019 1525   GLUCOSEU >=500 (A) 03/11/2019 1525   HGBUR NEGATIVE 03/11/2019 1525   BILIRUBINUR NEGATIVE 03/11/2019 1525   KETONESUR NEGATIVE 03/11/2019 1525   PROTEINUR NEGATIVE 03/11/2019 1525   NITRITE NEGATIVE 03/11/2019 1525   LEUKOCYTESUR NEGATIVE 03/11/2019 1525    ----------------------------------------------------------------------------------------------------------------  Imaging results:   Dg Toe Great Left  Result Date: 04/11/2019 CLINICAL DATA:  Pt has diabetic ulcer on right great toe. C/o having no feeling in any of his toes on either foot for quite some time. Family hx of diabetes. EXAM: LEFT GREAT TOE COMPARISON:  None. FINDINGS: Ulcerated lesion on the inferomedial aspect of the right great toe measuring approximately 1.8 cm. On the frontal view the deep aspect of the ulcer appears to abut the cortex of the distal phalanx. No definite bony destruction identified. No other acute finding in the visualized skeleton. IMPRESSION: Ulcerated lesion on the inferomedial aspect of the right great toe. On the frontal view the deep aspect of the ulcer  appears to abut the cortex of the distal phalanx. No definite bony destruction. Electronically Signed   By: Emmaline Kluver M.D.   On: 04/11/2019 15:30      Assessment & Plan  Left foot diabetic ulcer, first toe , Start IV antibiotics Consult orthopedic surgery for evaluation  Insulin-dependent diabetes mellitus Continue with Lantus/ISS  Hypertension Continue lisinopril  DVT Prophylaxis Lovenox  AM Labs Ordered, also please review Full Orders    Code Status full  Disposition Plan: Home  Time spent in minutes : 42 minutes  Condition GUARDED   @SIGNATURE @

## 2019-04-11 NOTE — Progress Notes (Signed)
Tried to clarify Lantus regimen with patient. He has used the Lantus from his deceased father for several months now due to cost. He initially told me he takes 40-45 units if his glucose is over 200-250, but then he said he might take 20 units if his sugar it high and it is almost never <175-200. Will initiate Lantus at 20 units/hs from home med rec and MD will need to titrate from there.  Cecil Vandyke S. Alford Highland, PharmD, [place] Clinical Staff Pharmacist

## 2019-04-11 NOTE — Progress Notes (Signed)
Pharmacy Antibiotic Note  Chris Smith is a 43 y.o. male admitted on 04/11/2019 with a diabetic foot infection. Pt has a left foot ulcer on the first toe, awaiting orthopedic surgery evaluation. Pt was previously admitted 1 month ago for same reason. He received 3 weeks of levoquin, however the wound has expanded. Per patient, he weighs 265 lbs. Pharmacy has been consulted for vancomycin and zosyn dosing.  WBC is wnls, he is afebrile, lactate is 1.7, and vital signs are stable. Of note, patient remains hyperglycemic.   Plan: Vancomycin 1500 mg IV X 1 (to provide a total loading dose of 2,500 mg since the patient received 1000 mg earlier) Vancomycin 2000 mg IV Q 12 hrs. Goal AUC 400-550. Expected AUC: 506.5 SCr used: 0.73 Zosyn 3.375g IV q8h (4 hour infusion). Monitor renal function, clinical status, WBC, Temp Draw vancomycin levels as needed     Temp (24hrs), Avg:98.4 F (36.9 C), Min:98.4 F (36.9 C), Max:98.4 F (36.9 C)  Recent Labs  Lab 04/11/19 1145 04/11/19 1540  WBC 9.3  --   CREATININE 0.73  --   LATICACIDVEN  --  1.7    CrCl cannot be calculated (Unknown ideal weight.).    No Known Allergies  Antimicrobials this admission: 10/09 Vancomycin >>  10/09 Zosyn >>   Microbiology results: 10/09 BCx: Sent 10/09 Superficial Culture: Sent 10/09 COVID: Sent  Thank you for allowing pharmacy to be a part of this patient's care.  Sherren Kerns, PharmD PGY1 Acute Care Pharmacy Resident 04/11/2019 5:09 PM

## 2019-04-11 NOTE — ED Triage Notes (Addendum)
Pt reports diabetic ulcer to R great toe that he has an appt on the 9th to see a doctor for possible hyperbaric therapy, states he now has more sores on the bottom of his foot that are painful. Hx of DM, reports blood sugars are well maintained. Reports he does not have insurance, no pcp and has been using insulin that he got from his father after he passed away.

## 2019-04-12 DIAGNOSIS — E1142 Type 2 diabetes mellitus with diabetic polyneuropathy: Secondary | ICD-10-CM

## 2019-04-12 DIAGNOSIS — E11621 Type 2 diabetes mellitus with foot ulcer: Secondary | ICD-10-CM

## 2019-04-12 DIAGNOSIS — L97511 Non-pressure chronic ulcer of other part of right foot limited to breakdown of skin: Secondary | ICD-10-CM

## 2019-04-12 DIAGNOSIS — M869 Osteomyelitis, unspecified: Secondary | ICD-10-CM

## 2019-04-12 LAB — CBC WITH DIFFERENTIAL/PLATELET
Abs Immature Granulocytes: 0.02 10*3/uL (ref 0.00–0.07)
Basophils Absolute: 0 10*3/uL (ref 0.0–0.1)
Basophils Relative: 1 %
Eosinophils Absolute: 0.1 10*3/uL (ref 0.0–0.5)
Eosinophils Relative: 2 %
HCT: 47 % (ref 39.0–52.0)
Hemoglobin: 16.3 g/dL (ref 13.0–17.0)
Immature Granulocytes: 0 %
Lymphocytes Relative: 24 %
Lymphs Abs: 1.9 10*3/uL (ref 0.7–4.0)
MCH: 31 pg (ref 26.0–34.0)
MCHC: 34.7 g/dL (ref 30.0–36.0)
MCV: 89.5 fL (ref 80.0–100.0)
Monocytes Absolute: 0.7 10*3/uL (ref 0.1–1.0)
Monocytes Relative: 9 %
Neutro Abs: 5.2 10*3/uL (ref 1.7–7.7)
Neutrophils Relative %: 64 %
Platelets: 203 10*3/uL (ref 150–400)
RBC: 5.25 MIL/uL (ref 4.22–5.81)
RDW: 12.9 % (ref 11.5–15.5)
WBC: 7.9 10*3/uL (ref 4.0–10.5)
nRBC: 0 % (ref 0.0–0.2)

## 2019-04-12 LAB — GLUCOSE, CAPILLARY
Glucose-Capillary: 107 mg/dL — ABNORMAL HIGH (ref 70–99)
Glucose-Capillary: 169 mg/dL — ABNORMAL HIGH (ref 70–99)
Glucose-Capillary: 114 mg/dL — ABNORMAL HIGH (ref 70–99)
Glucose-Capillary: 174 mg/dL — ABNORMAL HIGH (ref 70–99)

## 2019-04-12 LAB — BASIC METABOLIC PANEL
Anion gap: 11 (ref 5–15)
BUN: 5 mg/dL — ABNORMAL LOW (ref 6–20)
CO2: 25 mmol/L (ref 22–32)
Calcium: 8.7 mg/dL — ABNORMAL LOW (ref 8.9–10.3)
Chloride: 99 mmol/L (ref 98–111)
Creatinine, Ser: 0.61 mg/dL (ref 0.61–1.24)
GFR calc Af Amer: >60 mL/min (ref >60)
GFR calc non Af Amer: >60 mL/min (ref >60)
Glucose, Bld: 176 mg/dL — ABNORMAL HIGH (ref 70–99)
Potassium: 3.5 mmol/L (ref 3.5–5.1)
Sodium: 135 mmol/L (ref 135–145)

## 2019-04-12 MED ORDER — GABAPENTIN 100 MG PO CAPS
100.0000 mg | ORAL_CAPSULE | Freq: Three times a day (TID) | ORAL | Status: DC
  Administered 2019-04-12 – 2019-04-17 (×15): 100 mg via ORAL
  Filled 2019-04-12 (×15): qty 1

## 2019-04-12 MED ORDER — AMLODIPINE BESYLATE 5 MG PO TABS
5.0000 mg | ORAL_TABLET | Freq: Every day | ORAL | Status: DC
  Administered 2019-04-12 – 2019-04-13 (×2): 5 mg via ORAL
  Filled 2019-04-12 (×2): qty 1

## 2019-04-12 NOTE — Consult Note (Signed)
ORTHOPAEDIC CONSULTATION  REQUESTING PHYSICIAN: Lorin Glassahal, [place], MD  Chief Complaint: Ulcer and cellulitis right great toe.  HPI: Chris Smith is a 43 y.o. male who presents with cellulitis ulceration and osteomyelitis of the right great toe.  Patient has uncontrolled type 2 diabetes and is also a smoker.  Patient works on his feet.  Past Medical History:  Diagnosis Date  . Acute respiratory failure (HCC) 09/2014  . Amnesia   . Aspiration pneumonia (HCC) 09/2014  . Chronic back pain   . Diabetes mellitus without complication (HCC)   . Encephalopathy 09/2014  . ETOH abuse   . Hypertension   . Ischemic hepatitis unknown  . Rhabdomyolysis unknown  . Sciatica    Past Surgical History:  Procedure Laterality Date  . APPENDECTOMY    . BACK SURGERY  2002   Lumbar   Social History   Socioeconomic History  . Marital status: Single    Spouse name: Not on file  . Number of children: 2  . Years of education: Not on file  . Highest education level: Not on file  Occupational History  . Occupation: Disability  Social Needs  . Financial resource strain: Not on file  . Food insecurity    Worry: Not on file    Inability: Not on file  . Transportation needs    Medical: Not on file    Non-medical: Not on file  Tobacco Use  . Smoking status: Current Every Day Smoker    Packs/day: 0.50    Years: 18.00    Pack years: 9.00    Types: Cigarettes  . Smokeless tobacco: Never Used  Substance and Sexual Activity  . Alcohol use: Yes    Alcohol/week: 0.0 standard drinks    Comment: 3 OR 4 DAYS PER WEEK  . Drug use: No  . Sexual activity: Not on file  Lifestyle  . Physical activity    Days per week: Not on file    Minutes per session: Not on file  . Stress: Not on file  Relationships  . Social Musicianconnections    Talks on phone: Not on file    Gets together: Not on file    Attends religious service: Not on file    Active member of club or organization: Not on file    Attends  meetings of clubs or organizations: Not on file    Relationship status: Not on file  Other Topics Concern  . Not on file  Social History Narrative  . Not on file   Family History  Problem Relation Age of Onset  . Cancer Mother        cause of death, ovarian & colon  . Diabetes Father   . Hypertension Father   . Gout Father   . Hyperlipidemia Father    - negative except otherwise stated in the family history section No Known Allergies Prior to Admission medications   Medication Sig Start Date End Date Taking? Authorizing Provider  acetaminophen (TYLENOL) 325 MG tablet Take 650 mg by mouth every 6 (six) hours as needed for moderate pain.   Yes [provider]  ibuprofen (ADVIL) 200 MG tablet Take 400 mg by mouth every 6 (six) hours as needed for headache or mild pain.   Yes [provider]  insulin aspart protamine- aspart (NOVOLOG MIX 70/30) (70-30) 100 UNIT/ML injection Inject 0.1 mLs (10 Units total) into the skin 2 (two) times daily with a meal. 03/14/19  Yes Ward, Chase PicketJaime Pilcher, PA-C  insulin  glargine (LANTUS) 100 UNIT/ML injection Inject 20-75 Units into the skin See admin instructions. Inject 20-75 units into the skin one to two times a day, per sliding scale   Yes [provider]  Insulin Pen Needle (PEN NEEDLES) 31G X 8 MM MISC Use as directed 03/14/19  Yes Ward, Chase Picket, PA-C  levofloxacin (LEVAQUIN) 750 MG tablet Take 1 tablet (750 mg total) by mouth daily. X 6 weeks 03/14/19  Yes Ward, Chase Picket, PA-C  lisinopril (ZESTRIL) 20 MG tablet Take 1 tablet (20 mg total) by mouth daily. 03/14/19  Yes Ward, Chase Picket, PA-C  Multiple Vitamins-Minerals (MULTIVITAMIN ADULT PO) Take 1 tablet by mouth daily.   Yes [provider]   Dg Toe Great Left  Result Date: 04/11/2019 CLINICAL DATA:  Pt has diabetic ulcer on right great toe. C/o having no feeling in any of his toes on either foot for quite some time. Family hx of diabetes. EXAM: LEFT  GREAT TOE COMPARISON:  None. FINDINGS: Ulcerated lesion on the inferomedial aspect of the right great toe measuring approximately 1.8 cm. On the frontal view the deep aspect of the ulcer appears to abut the cortex of the distal phalanx. No definite bony destruction identified. No other acute finding in the visualized skeleton. IMPRESSION: Ulcerated lesion on the inferomedial aspect of the right great toe. On the frontal view the deep aspect of the ulcer appears to abut the cortex of the distal phalanx. No definite bony destruction. Electronically Signed   By: Emmaline Kluver M.D.   On: 04/11/2019 15:30   - pertinent xrays, CT, MRI studies were reviewed and independently interpreted  Positive ROS: All other systems have been reviewed and were otherwise negative with the exception of those mentioned in the HPI and as above.  Physical Exam: General: Alert, no acute distress Psychiatric: Patient is competent for consent with normal mood and affect Lymphatic: No axillary or cervical lymphadenopathy Cardiovascular: No pedal edema Respiratory: No cyanosis, no use of accessory musculature GI: No organomegaly, abdomen is soft and non-tender    Images:  @ENCIMAGES @  Labs:  Lab Results  Component Value Date   HGBA1C 8.8 (H) 03/12/2019   HGBA1C 11.0 (H) 04/28/2016   HGBA1C 10.10 10/13/2014   REPTSTATUS PENDING 04/11/2019   GRAMSTAIN  04/11/2019    RARE WBC PRESENT, PREDOMINANTLY PMN FEW GRAM POSITIVE COCCI RARE GRAM POSITIVE RODS    CULT  04/11/2019    CULTURE REINCUBATED FOR BETTER GROWTH Performed at [facility], [address] [place], [place]     Lab Results  Component Value Date   ALBUMIN 3.5 04/11/2019   ALBUMIN 3.6 03/11/2019   ALBUMIN 3.9 02/10/2019    Neurologic: Patient does not have protective sensation bilateral lower extremities.   MUSCULOSKELETAL:   Skin: Examination there is cellulitis swelling and ulceration beneath the IP joint of the right  great toe.  Patient has a palpable dorsalis pedis and posterior tibial pulse.  Review of the MRI scan shows osteomyelitis of the right great toe.  Patient's hemoglobin A1c has ranged from 11 and is currently 8.8 with uncontrolled type 2 diabetes.  Assessment: Assessment: Uncontrolled type 2 diabetes, smoker, osteomyelitis of the right great toe with ulceration.  Plan: Plan: Have recommended proceeding with amputation of right great toe through the MTP joint.  Patient states at this point he does not want to consider surgery.  Discussed that I will reevaluate his foot on Monday and review treatment options.  With patient's chronic  ulcer and osteomyelitis I do not feel that this will resolve without surgical intervention.  Thank you for the consult and the opportunity to see Mr. Matyas Baisley, [place] [phone] 3:14 PM    ]

## 2019-04-12 NOTE — Progress Notes (Signed)
PROGRESS NOTE  [name]  DOB: 1976-03-14  PCP: Clayborn Heron, MD XTK:[id number]  DOA: 04/11/2019  LOS: 1 day   Brief narrative: Chris Smith is a 43 y.o. male with PMH of type 2 diabetes mellitus for last 10 years, chronic back pain, hypertension, alcoholism, tobacco use.  Patient presented to the ED on 10/9 with complaint of nonhealing right foot ulcer. Last hospitalization 9/8-9/11 for sepsis secondary to right foot ulcer.  MRI of right foot on that admission showed, early osteomyelitis.  He was started on IV antibiotics.  Patient wanted to try nonoperative nature because he was afraid to lose his toe.  Long-term IV antibiotics was planned.  However patient signed out AMA on 9/11. Patient states right foot also has not improved and he presented to the ED on 10/9 for the same.  No fever. In the ED, patient was not febrile, blood pressure elevated up to 169/124. WBC count normal at 9.3, lactic acid normal at 1.7, renal function normal. Patient was admitted for management of diabetic foot ulcer.  Subjective: Patient was seen and examined this morning.  Young Caucasian male.  Lying down in bed.  Not in distress.  Complains of inadequate pain control. He claims he did not leave AMA last admission and was discharged.  I see it otherwise in documentation.  Patient states not having medical insurance and inability to afford all his medications.  He has been rationing insulin given by his aunt.  Assessment/Plan:  Nonhealing diabetic foot ulcer on the right Early osteomyelitis per MRI from a month ago -Not septic. -Left AMA on last admission before completing treatment. -Patient wants surgical treatment this time. -Currently on IV vancomycin and IV Zosyn. -Discussed with orthopedics Dr. August Saucer and Dr. Lajoyce Corners this morning.  Patient will be seen as consultation.  Type 2 diabetes mellitus for last 10 years. -A1c 8.8 on 9/8 -Takes Lantus at home but inconsistent doses because he does not have  medical insurance and is rationing insulin. -Currently on Lantus 20 units nightly with sliding scale and Accu-Cheks. -I will also start on Neurontin because of peripheral neuropathy. -Obtain lipid panel.  Hypertension -Blood pressure elevated up to 160s. -I noticed that he is only on lisinopril 20 mg daily at home. -Currently on the same.  I will also add amlodipine 5 mg daily starting this morning.  Chronic back pain -On ibuprofen at home. -Currently on Vicodin 5/325 1 tab q4hrs as needed.  Continue the same.  Alcohol abuse/tobacco abuse -Counseled to quit. -Not on withdrawal symptoms at this time.  No medical insurance -Medication assistance may be required at discharge.  Mobility: Needs PT evaluation after surgery DVT prophylaxis:  On Lovenox Code Status:   Code Status: Full Code  Family Communication:  Expected Discharge:  Pending surgical evaluation  Consultants:  Orthopedic surgery  Procedures:  None  Antimicrobials: Anti-infectives (From admission, onward)   Start     Dose/Rate Route Frequency Ordered Stop   04/12/19 0600  vancomycin (VANCOCIN) 2,000 mg in sodium chloride 0.9 % 500 mL IVPB     2,000 mg 250 mL/hr over 120 Minutes Intravenous Every 12 hours 04/11/19 1708     04/11/19 2330  piperacillin-tazobactam (ZOSYN) IVPB 3.375 g     3.375 g 12.5 mL/hr over 240 Minutes Intravenous Every 8 hours 04/11/19 1704     04/11/19 1715  vancomycin (VANCOCIN) 1,500 mg in sodium chloride 0.9 % 500 mL IVPB     1,500 mg 250 mL/hr over 120 Minutes Intravenous  Once 04/11/19 1707 04/11/19 2101   04/11/19 1445  piperacillin-tazobactam (ZOSYN) IVPB 3.375 g     3.375 g 100 mL/hr over 30 Minutes Intravenous  Once 04/11/19 1435 04/11/19 1716   04/11/19 1445  vancomycin (VANCOCIN) IVPB 1000 mg/200 mL premix     1,000 mg 200 mL/hr over 60 Minutes Intravenous  Once 04/11/19 1435 04/11/19 1717      Diet Order            Diet Carb Modified Fluid consistency: Thin; Room  service appropriate? Yes  Diet effective now              Infusions:  . sodium chloride    . piperacillin-tazobactam (ZOSYN)  IV 3.375 g (04/12/19 0959)  . vancomycin 2,000 mg (04/12/19 0524)    Scheduled Meds: . enoxaparin (LOVENOX) injection  40 mg Subcutaneous Q24H  . insulin aspart  0-5 Units Subcutaneous QHS  . insulin aspart  0-9 Units Subcutaneous TID WC  . insulin aspart protamine- aspart  10 Units Subcutaneous BID WC  . insulin glargine  20 Units Subcutaneous QHS  . lisinopril  20 mg Oral Daily  . sodium chloride flush  3 mL Intravenous Q12H    PRN meds: sodium chloride, acetaminophen, HYDROcodone-acetaminophen, morphine injection, ondansetron **OR** ondansetron (ZOFRAN) IV, sodium chloride flush   Objective: Vitals:   04/12/19 0327 04/12/19 0859  BP: (!) 159/119 (!) 160/104  Pulse: 69 85  Resp: 18   Temp: (!) 97.5 F (36.4 C) 98 F (36.7 C)  SpO2: 96% 97%    Intake/Output Summary (Last 24 hours) at 04/12/2019 1105 Last data filed at 04/12/2019 0900 Gross per 24 hour  Intake 808.32 ml  Output -  Net 808.32 ml   Filed Weights   04/11/19 1754  Weight: 105.1 kg   Weight change:  Body mass index is 29.76 kg/m.   Physical Exam: General exam: Appears calm and comfortable.  Skin: No rashes, lesions or ulcers. HEENT: Atraumatic, normocephalic, supple neck, no obvious bleeding Lungs: Clear to auscultation bilaterally CVS: Regular rate and rhythm, no murmur GI/Abd soft, nontender, nondistended, bowel sound present CNS: Alert, awake, oriented x3 Psychiatry: Mood appropriate Extremities: No pedal edema, no calf tenderness, right foot first toe ulcer, with drainage.  Data Review: I have personally reviewed the laboratory data and studies available.  Recent Labs  Lab 04/11/19 1145 04/12/19 0929  WBC 9.3 7.9  NEUTROABS 4.9 5.2  HGB 16.5 16.3  HCT 51.0 47.0  MCV 93.8 89.5  PLT 235 203   Recent Labs  Lab 04/11/19 1145 04/12/19 0929  NA 139 135   K 3.9 3.5  CL 101 99  CO2 28 25  GLUCOSE 294* 176*  BUN <5* 5*  CREATININE 0.73 0.61  CALCIUM 8.8* 8.7*    Terrilee Croak, MD  [facility] 04/12/2019

## 2019-04-12 NOTE — Progress Notes (Signed)
bp climbing dystolic 599 administered am dose zesteril early

## 2019-04-13 LAB — BASIC METABOLIC PANEL
Anion gap: 11 (ref 5–15)
BUN: 6 mg/dL (ref 6–20)
CO2: 27 mmol/L (ref 22–32)
Calcium: 8.8 mg/dL — ABNORMAL LOW (ref 8.9–10.3)
Chloride: 101 mmol/L (ref 98–111)
Creatinine, Ser: 0.64 mg/dL (ref 0.61–1.24)
GFR calc Af Amer: >60 mL/min (ref >60)
GFR calc non Af Amer: >60 mL/min (ref >60)
Glucose, Bld: 199 mg/dL — ABNORMAL HIGH (ref 70–99)
Potassium: 3.8 mmol/L (ref 3.5–5.1)
Sodium: 139 mmol/L (ref 135–145)

## 2019-04-13 LAB — LIPID PANEL
Cholesterol: 164 mg/dL (ref 0–200)
HDL: 41 mg/dL (ref >40)
LDL Cholesterol: 80 mg/dL (ref 0–99)
Total CHOL/HDL Ratio: 4 RATIO
Triglycerides: 216 mg/dL — ABNORMAL HIGH (ref <150)
VLDL: 43 mg/dL — ABNORMAL HIGH (ref 0–40)

## 2019-04-13 LAB — AEROBIC CULTURE W GRAM STAIN (SUPERFICIAL SPECIMEN): Culture: NORMAL

## 2019-04-13 LAB — CBC WITH DIFFERENTIAL/PLATELET
Abs Immature Granulocytes: 0.01 10*3/uL (ref 0.00–0.07)
Basophils Absolute: 0 10*3/uL (ref 0.0–0.1)
Basophils Relative: 1 %
Eosinophils Absolute: 0.1 10*3/uL (ref 0.0–0.5)
Eosinophils Relative: 2 %
HCT: 50 % (ref 39.0–52.0)
Hemoglobin: 16.3 g/dL (ref 13.0–17.0)
Immature Granulocytes: 0 %
Lymphocytes Relative: 33 %
Lymphs Abs: 2 10*3/uL (ref 0.7–4.0)
MCH: 30.1 pg (ref 26.0–34.0)
MCHC: 32.6 g/dL (ref 30.0–36.0)
MCV: 92.3 fL (ref 80.0–100.0)
Monocytes Absolute: 0.7 10*3/uL (ref 0.1–1.0)
Monocytes Relative: 11 %
Neutro Abs: 3.2 10*3/uL (ref 1.7–7.7)
Neutrophils Relative %: 53 %
Platelets: 200 10*3/uL (ref 150–400)
RBC: 5.42 MIL/uL (ref 4.22–5.81)
RDW: 13 % (ref 11.5–15.5)
WBC: 6.1 10*3/uL (ref 4.0–10.5)
nRBC: 0 % (ref 0.0–0.2)

## 2019-04-13 LAB — GLUCOSE, CAPILLARY
Glucose-Capillary: 207 mg/dL — ABNORMAL HIGH (ref 70–99)
Glucose-Capillary: 136 mg/dL — ABNORMAL HIGH (ref 70–99)
Glucose-Capillary: 261 mg/dL — ABNORMAL HIGH (ref 70–99)
Glucose-Capillary: 263 mg/dL — ABNORMAL HIGH (ref 70–99)

## 2019-04-13 LAB — VANCOMYCIN, PEAK: Vancomycin Pk: 23 ug/mL — ABNORMAL LOW (ref 30–40)

## 2019-04-13 MED ORDER — ALPRAZOLAM 0.25 MG PO TABS
0.2500 mg | ORAL_TABLET | Freq: Two times a day (BID) | ORAL | Status: DC | PRN
  Administered 2019-04-13 – 2019-04-16 (×8): 0.25 mg via ORAL
  Filled 2019-04-13 (×8): qty 1

## 2019-04-13 MED ORDER — INSULIN GLARGINE 100 UNIT/ML ~~LOC~~ SOLN
25.0000 [IU] | Freq: Every day | SUBCUTANEOUS | Status: DC
  Administered 2019-04-13 – 2019-04-14 (×2): 25 [IU] via SUBCUTANEOUS
  Filled 2019-04-13 (×3): qty 0.25

## 2019-04-13 MED ORDER — AMLODIPINE BESYLATE 10 MG PO TABS
10.0000 mg | ORAL_TABLET | Freq: Every day | ORAL | Status: DC
  Administered 2019-04-14 – 2019-04-17 (×5): 10 mg via ORAL
  Filled 2019-04-13 (×4): qty 1

## 2019-04-13 MED ORDER — HYDRALAZINE HCL 20 MG/ML IJ SOLN
10.0000 mg | Freq: Four times a day (QID) | INTRAMUSCULAR | Status: DC | PRN
  Administered 2019-04-13: 10 mg via INTRAVENOUS
  Filled 2019-04-13: qty 1

## 2019-04-13 NOTE — Plan of Care (Signed)
  Problem: Education: Goal: Knowledge of General Education information will improve Description: Including pain rating scale, medication(s)/side effects and non-pharmacologic comfort measures Outcome: Progressing   Problem: Health Behavior/Discharge Planning: Goal: Ability to manage health-related needs will improve Outcome: Progressing   Problem: Clinical Measurements: Goal: Ability to maintain clinical measurements within normal limits will improve Outcome: Progressing   Problem: Activity: Goal: Risk for activity intolerance will decrease Outcome: Progressing   Problem: Pain Managment: Goal: General experience of comfort will improve Outcome: Progressing   Problem: Safety: Goal: Ability to remain free from injury will improve Outcome: Progressing   

## 2019-04-13 NOTE — Plan of Care (Signed)
  Problem: Education: Goal: Knowledge of General Education information will improve Description: Including pain rating scale, medication(s)/side effects and non-pharmacologic comfort measures Outcome: Progressing   Problem: Pain Managment: Goal: General experience of comfort will improve Outcome: Progressing   Problem: Skin Integrity: Goal: Risk for impaired skin integrity will decrease Outcome: Progressing   

## 2019-04-13 NOTE — Progress Notes (Signed)
PROGRESS NOTE  Chris Smith  DOB: May 04, 1976  PCP: Clayborn Heron, MD MKL:[id number]  DOA: 04/11/2019  LOS: 2 days   Brief narrative: Chris Smith is a 43 y.o. male with PMH of type 2 diabetes mellitus for last 10 years, chronic back pain, hypertension, alcoholism, tobacco use.  Patient presented to the ED on 10/9 with complaint of nonhealing right foot ulcer. Last hospitalization 9/8-9/11 for sepsis secondary to right foot ulcer.  MRI of right foot on that admission showed, early osteomyelitis.  He was started on IV antibiotics.  Patient wanted to try nonoperative nature because he was afraid to lose his toe.  Long-term IV antibiotics was planned.  However patient signed out AMA on 9/11. Patient states right foot also has not improved and he presented to the ED on 10/9 for the same.  No fever. In the ED, patient was not febrile, blood pressure elevated up to 169/124. WBC count normal at 9.3, lactic acid normal at 1.7, renal function normal. Patient was admitted for management of diabetic foot ulcer.  Subjective: Patient was seen and examined this morning.  Young Caucasian male.  Lying down in bed.  Not in distress.  Complains of inadequate pain control. He claims he did not leave AMA last admission and was discharged.  I see it otherwise in documentation.  Patient states not having medical insurance and inability to afford all his medications.  He has been rationing insulin given by his aunt.  Assessment/Plan: Nonhealing diabetic foot ulcer on the right Early osteomyelitis per MRI from a month ago -Not septic. -Currently on IV vancomycin and IV Zosyn. -Seen by orthopedics Dr. Lajoyce Corners yesterday.  Follow-up tomorrow.  Patient may need surgical amputation of the toe which she is still not convinced. -It may be difficult to do IV antibiotics at home antibiotics because of not having insurance. -Left AMA on last admission before completing treatment.  Type 2 diabetes mellitus for last 10  years. -A1c 8.8 on 9/8 -Takes Lantus at home but inconsistent doses because he does not have medical insurance and is rationing insulin. -Currently on Lantus 20 units nightly with sliding scale and Accu-Cheks.  Blood sugar still uncontrolled.  I will increase Lantus to 25 units nightly. -I started the patient on Neurontin.  -Lipid panel shows HDL 41, LDL 80  Hypertension -Blood pressure elevated up to 150s today.. -I noticed that he is only on lisinopril 20 mg daily at home. -I added amlodipine 5 mg daily yesterday.  I will increase the dose to 10 mg daily.   Chronic back pain -On ibuprofen at home. -Currently on Vicodin 5/325 1 tab q4hrs as needed.  Continue the same.  Alcohol abuse/tobacco abuse -Counseled to quit. -Not on withdrawal symptoms at this time.  No medical insurance -Medication assistance may be required at discharge.  Mobility: Needs PT evaluation after surgery DVT prophylaxis:  On Lovenox Code Status:   Code Status: Full Code  Family Communication:  Expected Discharge:  Pending surgical evaluation  Consultants:  Orthopedic surgery  Procedures:  None  Antimicrobials: Anti-infectives (From admission, onward)   Start     Dose/Rate Route Frequency Ordered Stop   04/12/19 0600  vancomycin (VANCOCIN) 2,000 mg in sodium chloride 0.9 % 500 mL IVPB     2,000 mg 250 mL/hr over 120 Minutes Intravenous Every 12 hours 04/11/19 1708     04/11/19 2330  piperacillin-tazobactam (ZOSYN) IVPB 3.375 g     3.375 g 12.5 mL/hr over 240 Minutes Intravenous Every 8 hours  04/11/19 1704     04/11/19 1715  vancomycin (VANCOCIN) 1,500 mg in sodium chloride 0.9 % 500 mL IVPB     1,500 mg 250 mL/hr over 120 Minutes Intravenous  Once 04/11/19 1707 04/11/19 2101   04/11/19 1445  piperacillin-tazobactam (ZOSYN) IVPB 3.375 g     3.375 g 100 mL/hr over 30 Minutes Intravenous  Once 04/11/19 1435 04/11/19 1716   04/11/19 1445  vancomycin (VANCOCIN) IVPB 1000 mg/200 mL premix     1,000  mg 200 mL/hr over 60 Minutes Intravenous  Once 04/11/19 1435 04/11/19 1717      Diet Order            Diet Carb Modified Fluid consistency: Thin; Room service appropriate? Yes  Diet effective now              Infusions:  . sodium chloride    . piperacillin-tazobactam (ZOSYN)  IV 3.375 g (04/13/19 6073)  . vancomycin 2,000 mg (04/13/19 0512)    Scheduled Meds: . amLODipine  5 mg Oral Daily  . enoxaparin (LOVENOX) injection  40 mg Subcutaneous Q24H  . gabapentin  100 mg Oral TID  . insulin aspart  0-5 Units Subcutaneous QHS  . insulin aspart  0-9 Units Subcutaneous TID WC  . insulin glargine  25 Units Subcutaneous QHS  . lisinopril  20 mg Oral Daily  . sodium chloride flush  3 mL Intravenous Q12H    PRN meds: sodium chloride, acetaminophen, HYDROcodone-acetaminophen, morphine injection, ondansetron **OR** ondansetron (ZOFRAN) IV, sodium chloride flush   Objective: Vitals:   04/13/19 0940 04/13/19 1134  BP: (!) 155/105 138/66  Pulse: 77 89  Resp:    Temp: 98 F (36.7 C) 98 F (36.7 C)  SpO2: 96% 96%    Intake/Output Summary (Last 24 hours) at 04/13/2019 1438 Last data filed at 04/13/2019 0900 Gross per 24 hour  Intake 1280.86 ml  Output 2 ml  Net 1278.86 ml   Filed Weights   04/11/19 1754  Weight: 105.1 kg   Weight change:  Body mass index is 29.76 kg/m.   Physical Exam: General exam: Appears calm and comfortable.  Skin: No rashes, lesions or ulcers. HEENT: Atraumatic, normocephalic, supple neck, no obvious bleeding Lungs: Clear to auscultation bilaterally CVS: Regular rate and rhythm, no murmur GI/Abd soft, nontender, nondistended, bowel sound present CNS: Alert, awake, oriented x3 Psychiatry: Mood appropriate, nervous after medicine about possible right toe amputation. Extremities: No pedal edema, no calf tenderness, right foot first toe ulcer, base looks cleaner today.  Data Review: I have personally reviewed the laboratory data and studies  available.  Recent Labs  Lab 04/11/19 1145 04/12/19 0929 04/13/19 0219  WBC 9.3 7.9 6.1  NEUTROABS 4.9 5.2 3.2  HGB 16.5 16.3 16.3  HCT 51.0 47.0 50.0  MCV 93.8 89.5 92.3  PLT 235 203 200   Recent Labs  Lab 04/11/19 1145 04/12/19 0929 04/13/19 0219  NA 139 135 139  K 3.9 3.5 3.8  CL 101 99 101  CO2 28 25 27   GLUCOSE 294* 176* 199*  BUN <5* 5* 6  CREATININE 0.73 0.61 0.64  CALCIUM 8.8* 8.7* 8.8*    Terrilee Croak, MD  [facility] 04/13/2019

## 2019-04-14 ENCOUNTER — Inpatient Hospital Stay: Payer: Self-pay

## 2019-04-14 ENCOUNTER — Encounter (HOSPITAL_COMMUNITY): Payer: Self-pay | Admitting: *Deleted

## 2019-04-14 DIAGNOSIS — E11628 Type 2 diabetes mellitus with other skin complications: Secondary | ICD-10-CM

## 2019-04-14 DIAGNOSIS — L089 Local infection of the skin and subcutaneous tissue, unspecified: Secondary | ICD-10-CM

## 2019-04-14 LAB — CBC WITH DIFFERENTIAL/PLATELET
Abs Immature Granulocytes: 0.02 10*3/uL (ref 0.00–0.07)
Basophils Absolute: 0.1 10*3/uL (ref 0.0–0.1)
Basophils Relative: 1 %
Eosinophils Absolute: 0.1 10*3/uL (ref 0.0–0.5)
Eosinophils Relative: 2 %
HCT: 52.5 % — ABNORMAL HIGH (ref 39.0–52.0)
Hemoglobin: 17.1 g/dL — ABNORMAL HIGH (ref 13.0–17.0)
Immature Granulocytes: 0 %
Lymphocytes Relative: 39 %
Lymphs Abs: 2.7 10*3/uL (ref 0.7–4.0)
MCH: 30.1 pg (ref 26.0–34.0)
MCHC: 32.6 g/dL (ref 30.0–36.0)
MCV: 92.3 fL (ref 80.0–100.0)
Monocytes Absolute: 0.7 10*3/uL (ref 0.1–1.0)
Monocytes Relative: 10 %
Neutro Abs: 3.3 10*3/uL (ref 1.7–7.7)
Neutrophils Relative %: 48 %
Platelets: 222 10*3/uL (ref 150–400)
RBC: 5.69 MIL/uL (ref 4.22–5.81)
RDW: 13.1 % (ref 11.5–15.5)
WBC: 6.9 10*3/uL (ref 4.0–10.5)
nRBC: 0 % (ref 0.0–0.2)

## 2019-04-14 LAB — VANCOMYCIN, TROUGH: Vancomycin Tr: 20 ug/mL (ref 15–20)

## 2019-04-14 LAB — GLUCOSE, CAPILLARY
Glucose-Capillary: 110 mg/dL — ABNORMAL HIGH (ref 70–99)
Glucose-Capillary: 207 mg/dL — ABNORMAL HIGH (ref 70–99)
Glucose-Capillary: 293 mg/dL — ABNORMAL HIGH (ref 70–99)
Glucose-Capillary: 254 mg/dL — ABNORMAL HIGH (ref 70–99)

## 2019-04-14 LAB — BASIC METABOLIC PANEL
Anion gap: 8 (ref 5–15)
BUN: 5 mg/dL — ABNORMAL LOW (ref 6–20)
CO2: 27 mmol/L (ref 22–32)
Calcium: 9 mg/dL (ref 8.9–10.3)
Chloride: 104 mmol/L (ref 98–111)
Creatinine, Ser: 0.7 mg/dL (ref 0.61–1.24)
GFR calc Af Amer: >60 mL/min (ref >60)
GFR calc non Af Amer: >60 mL/min (ref >60)
Glucose, Bld: 123 mg/dL — ABNORMAL HIGH (ref 70–99)
Potassium: 4 mmol/L (ref 3.5–5.1)
Sodium: 139 mmol/L (ref 135–145)

## 2019-04-14 MED ORDER — SODIUM CHLORIDE 0.9% FLUSH: 10.0000 mL | INTRAVENOUS | Status: DC | PRN

## 2019-04-14 MED ORDER — PNEUMOCOCCAL VAC POLYVALENT 25 MCG/0.5ML IJ INJ
0.5000 mL | INJECTION | INTRAMUSCULAR | Status: AC
  Administered 2019-04-15: 0.5 mL via INTRAMUSCULAR
  Filled 2019-04-14: qty 0.5

## 2019-04-14 MED ORDER — CHLORHEXIDINE GLUCONATE CLOTH 2 % EX PADS
6.0000 | MEDICATED_PAD | Freq: Every day | CUTANEOUS | Status: DC
  Administered 2019-04-14 – 2019-04-17 (×4): 6 via TOPICAL

## 2019-04-14 MED ORDER — SODIUM CHLORIDE 0.9% FLUSH
10.0000 mL | Freq: Two times a day (BID) | INTRAVENOUS | Status: DC
  Administered 2019-04-15: 10 mL

## 2019-04-14 NOTE — Progress Notes (Signed)
PROGRESS NOTE  Chris Smith  DOB: February 03, 1976  PCP: Aretta Nip, MD PNT:[id number]  DOA: 04/11/2019  LOS: 3 days   Brief narrative: Chris Smith is a 43 y.o. male with PMH of type 2 diabetes mellitus for last 10 years, chronic back pain, hypertension, alcoholism, tobacco use.  Patient presented to the ED on 10/9 with complaint of nonhealing right foot ulcer. Last hospitalization 9/8-9/11 for sepsis secondary to right foot ulcer.  MRI of right foot on that admission showed, early osteomyelitis.  He was started on IV antibiotics.  Patient wanted to try nonoperative nature because he was afraid to lose his toe.  Long-term IV antibiotics was planned.  However patient signed out AMA on 9/11. Patient states right foot also has not improved and he presented to the ED on 10/9 for the same.  No fever. In the ED, patient was not febrile, blood pressure elevated up to 169/124. WBC count normal at 9.3, lactic acid normal at 1.7, renal function normal. Patient was admitted for management of diabetic foot ulcer.  Subjective: Patient was seen and examined this afternoon.  He is very stressed about surgical intervention.  He has not been able to make consistent decision.  At the time of my evaluation, he stated that he would like to wait and see how antibiotics would help with wound before making decision for surgery.   Pain controlled.  Assessment/Plan: Nonhealing diabetic foot ulcer on the right Early osteomyelitis per MRI from a month ago -Not septic. -Currently on IV vancomycin and IV Zosyn. -Seen by orthopedics Dr. Sharol Given.  Recommended surgical amputation of the great toe.  Patient would like to avoid surgery at this time and would like to try IV antibiotics.   -PICC line ordered.  Needs antibiotics for 6 weeks.  Discussed with Education officer, museum.   -Anticipate discharge home tomorrow.  Type 2 diabetes mellitus for last 10 years. -A1c 8.8 on 9/8 -Takes Lantus at home but inconsistent doses because  he does not have medical insurance and was rationing insulin. -Currently on Lantus 20 units nightly with sliding scale and Accu-Cheks.  Blood sugar is now reasonably controlled on  Lantus to 25 units nightly. -I started the patient on Neurontin.  -Lipid panel shows HDL 41, LDL 80  Hypertension -Blood pressure elevated up to 150s today.. -I noticed that he is only on lisinopril 20 mg daily at home. -I added amlodipine 10 mg daily as well.  Chronic back pain -On ibuprofen at home. -Currently on Vicodin 5/325 1 tab q4hrs as needed.  Continue the same.  Alcohol abuse/tobacco abuse -Counseled to quit. -Not on withdrawal symptoms at this time.  No medical insurance -Medication assistance may be required at discharge.  Mobility: Needs PT evaluation after surgery DVT prophylaxis:  On Lovenox Code Status:   Code Status: Full Code  Family Communication:  Expected Discharge:  Pending surgical evaluation  Consultants:  Orthopedic surgery  Procedures:  None  Antimicrobials: Anti-infectives (From admission, onward)   Start     Dose/Rate Route Frequency Ordered Stop   04/12/19 0600  vancomycin (VANCOCIN) 2,000 mg in sodium chloride 0.9 % 500 mL IVPB     2,000 mg 250 mL/hr over 120 Minutes Intravenous Every 12 hours 04/11/19 1708     04/11/19 2330  piperacillin-tazobactam (ZOSYN) IVPB 3.375 g     3.375 g 12.5 mL/hr over 240 Minutes Intravenous Every 8 hours 04/11/19 1704     04/11/19 1715  vancomycin (VANCOCIN) 1,500 mg in sodium chloride 0.9 %  500 mL IVPB     1,500 mg 250 mL/hr over 120 Minutes Intravenous  Once 04/11/19 1707 04/11/19 2101   04/11/19 1445  piperacillin-tazobactam (ZOSYN) IVPB 3.375 g     3.375 g 100 mL/hr over 30 Minutes Intravenous  Once 04/11/19 1435 04/11/19 1716   04/11/19 1445  vancomycin (VANCOCIN) IVPB 1000 mg/200 mL premix     1,000 mg 200 mL/hr over 60 Minutes Intravenous  Once 04/11/19 1435 04/11/19 1717      Diet Order            Diet Carb  Modified Fluid consistency: Thin; Room service appropriate? Yes  Diet effective now              Infusions:  . sodium chloride    . piperacillin-tazobactam (ZOSYN)  IV 3.375 g (04/14/19 0933)  . vancomycin 2,000 mg (04/14/19 3295)    Scheduled Meds: . amLODipine  10 mg Oral Daily  . enoxaparin (LOVENOX) injection  40 mg Subcutaneous Q24H  . gabapentin  100 mg Oral TID  . insulin aspart  0-5 Units Subcutaneous QHS  . insulin aspart  0-9 Units Subcutaneous TID WC  . insulin glargine  25 Units Subcutaneous QHS  . lisinopril  20 mg Oral Daily  . [START ON 04/15/2019] pneumococcal 23 valent vaccine  0.5 mL Intramuscular Tomorrow-1000  . sodium chloride flush  3 mL Intravenous Q12H    PRN meds: sodium chloride, acetaminophen, ALPRAZolam, hydrALAZINE, HYDROcodone-acetaminophen, morphine injection, ondansetron **OR** ondansetron (ZOFRAN) IV, sodium chloride flush   Objective: Vitals:   04/14/19 0751 04/14/19 0936  BP: (!) 140/113 (!) 140/113  Pulse: (!) 103   Resp:    Temp:    SpO2:      Intake/Output Summary (Last 24 hours) at 04/14/2019 1415 Last data filed at 04/14/2019 0900 Gross per 24 hour  Intake 951.75 ml  Output -  Net 951.75 ml   Filed Weights   04/11/19 1754  Weight: 105.1 kg   Weight change:  Body mass index is 29.76 kg/m.   Physical Exam: General exam: Appears calm and comfortable.  Skin: No rashes, lesions or ulcers. HEENT: Atraumatic, normocephalic, supple neck, no obvious bleeding Lungs: Clear to auscultation bilaterally CVS: Regular rate and rhythm, no murmur GI/Abd soft, nontender, nondistended, bowel sound present CNS: Alert, awake, oriented x3 Psychiatry: Anxious Extremities: No pedal edema, no calf tenderness, right foot first toe ulcer, base looks cleaner today.  Data Review: I have personally reviewed the laboratory data and studies available.  Recent Labs  Lab 04/11/19 1145 04/12/19 0929 04/13/19 0219 04/14/19 0620  WBC 9.3 7.9  6.1 6.9  NEUTROABS 4.9 5.2 3.2 3.3  HGB 16.5 16.3 16.3 17.1*  HCT 51.0 47.0 50.0 52.5*  MCV 93.8 89.5 92.3 92.3  PLT 235 203 200 222   Recent Labs  Lab 04/11/19 1145 04/12/19 0929 04/13/19 0219 04/14/19 0620  NA 139 135 139 139  K 3.9 3.5 3.8 4.0  CL 101 99 101 104  CO2 28 25 27 27   GLUCOSE 294* 176* 199* 123*  BUN <5* 5* 6 5*  CREATININE 0.73 0.61 0.64 0.70  CALCIUM 8.8* 8.7* 8.8* 9.0    , MD  [facility] 04/14/2019

## 2019-04-14 NOTE — Progress Notes (Signed)
Dr Sharol Given was notified of the patient's request to have a PICC line and have antibiotics now and would like to have more time if possible to think about having surgery.  The patient has been crying and was medicated for anxiety.

## 2019-04-14 NOTE — Progress Notes (Addendum)
Patient ID: Chris Smith, male   DOB: [birth year], 43 y.o.   MRN: [id number] Patient is seen in follow-up for osteomyelitis involving the proximal phalanx and tuft of the right great toe.  Patient is currently on IV antibiotics.  Examination there is still swelling the ulcer is slightly smaller however there is necrotic flexor tendon in the base of the wound.  The MRI scan does show osteomyelitis of the proximal phalanx and tuft of the toe.  Discussed with the patient ideal treatment is to proceed with amputation of the great toe through the MTP joint.  Discussed that prolonged IV antibiotics with a PICC line and could suppress the infection but would not eliminate the underlying infection of the bone.  Patient was concerned that he had things to get in place at home.  I have recommended proceeding with surgery on Wednesday with discharge to home on Thursday with home health therapy as well as durable medical equipment.  Risks of long-term antibiotics were also discussed.  Risk of surgery was discussed.  Patient states he understands and would like to proceed with surgery on Wednesday.  I was just called from the patient's nurse and patient has decided at this time he does not want to consider surgery wants to try IV antibiotics with a PICC line.  I will follow-up in the office in 2 weeks.  Surgery is canceled.

## 2019-04-14 NOTE — Progress Notes (Signed)
Peripherally Inserted Central Catheter/Midline Placement  The IV Nurse has discussed with the patient and/or persons authorized to consent for the patient, the purpose of this procedure and the potential benefits and risks involved with this procedure.  The benefits include less needle sticks, lab draws from the catheter, and the patient may be discharged home with the catheter. Risks include, but not limited to, infection, bleeding, blood clot (thrombus formation), and puncture of an artery; nerve damage and irregular heartbeat and possibility to perform a PICC exchange if needed/ordered by physician.  Alternatives to this procedure were also discussed.  Bard Power PICC patient education guide, fact sheet on infection prevention and patient information card has been provided to patient /or left at bedside.    PICC/Midline Placement Documentation  PICC Single Lumen 04/14/19 PICC Right Cephalic 43 cm 0 cm (Active)  Indication for Insertion or Continuance of Line Home intravenous therapies (PICC only) 04/14/19 1600  Exposed Catheter (cm) 0 cm 04/14/19 1600  Site Assessment Clean;Dry;Intact 04/14/19 1600  Line Status Flushed;Saline locked;Blood return noted 04/14/19 1600  Dressing Type Transparent;Securing device 04/14/19 1600  Dressing Status Clean;Dry;Intact;Antimicrobial disc in place 04/14/19 1600  Dressing Change Due 04/21/19 04/14/19 1600       Holley Bouche Renee 04/14/2019, 4:11 PM

## 2019-04-14 NOTE — TOC Initial Note (Signed)
Transition of Care [facility]) - Initial/Assessment Note    Patient Details  Name: Chris Smith MRN: [id number] Date of Birth: 05-26-1976  [facility]) CM/SW Contact:    Benard Halsted, LCSW Phone Number: 04/14/2019, 2:37 PM  Clinical Narrative:                 CSW following for home health and long term IV antibiotics. CSW reached out to [facility] with [employer] regarding home antibiotic qualification since patient does not have insurance. She will complete assessment. CSW also reached out to this week's assigned [place], [facility], and they are unable to accept patient. CSW will contact other agencies.   Expected Discharge Plan: [place] Barriers to Discharge: Continued Medical Work up, Inadequate or no insurance   Patient Goals and CMS Choice Patient states their goals for this hospitalization and ongoing recovery are:: Return home to get his affairs in order CMS Medicare.gov Compare Post Acute Care list provided to:: Patient Choice offered to / list presented to : Patient  Expected Discharge Plan and Services Expected Discharge Plan: [place] In-house Referral: NA Discharge Planning Services: CM Consult Post Acute Care Choice: Durable Medical Equipment, Home Health Living arrangements for the past 2 months: Single Family Home                 DME Arranged: IV pump/equipment DME Agency: Other - Comment([employer]) Date DME Agency Contacted: 04/14/19 Time DME Agency Contacted: [phone] Representative spoke with at DME Agency: [place]: RN          Prior Living Arrangements/Services Living arrangements for the past 2 months: [place] Lives with:: Self Patient language and need for interpreter reviewed:: Yes Do you feel safe going back to the place where you live?: Yes      Need for Family Participation in Patient Care: No (Comment) Care giver support system in place?: No (comment)   Criminal Activity/Legal  Involvement Pertinent to Current Situation/Hospitalization: No - Comment as needed  Activities of Daily Living Home Assistive Devices/Equipment: None ADL Screening (condition at time of admission) Patient's cognitive ability adequate to safely complete daily activities?: Yes Is the patient deaf or have difficulty hearing?: No Does the patient have difficulty seeing, even when wearing glasses/contacts?: No Does the patient have difficulty concentrating, remembering, or making decisions?: No Patient able to express need for assistance with ADLs?: Yes Does the patient have difficulty dressing or bathing?: No Independently performs ADLs?: Yes (appropriate for developmental age) Does the patient have difficulty walking or climbing stairs?: No Weakness of Legs: None Weakness of Arms/Hands: None  Permission Sought/Granted Permission sought to share information with : Facility Art therapist granted to share information with : Yes, Verbal Permission Granted     Permission granted to share info w AGENCY: HH        Emotional Assessment Appearance:: Appears stated age Attitude/Demeanor/Rapport: Other (comment)(Appropriate) Affect (typically observed): Accepting, Appropriate Orientation: : Oriented to Self, Oriented to Place, Oriented to  Time, Oriented to Situation Alcohol / Substance Use: Not Applicable Psych Involvement: No (comment)  Admission diagnosis:  Diabetic foot infection ([place]) [X32.440, L08.9] Patient Active Problem List   Diagnosis Date Noted  . Osteomyelitis of great toe of right foot ([place])   . Diabetic polyneuropathy associated with type 2 diabetes mellitus ([place])   . Diabetic foot ulcer ([place]) 04/11/2019  . Osteomyelitis due to type 2 diabetes mellitus ([place]) 03/12/2019  . Sepsis ([place]) 03/12/2019  .  Hyperglycemia 03/12/2019  . Poorly controlled diabetes mellitus ([facility]) 03/12/2019  . Diabetic ulcer of right great toe (H[facility]) 03/11/2019  . Dehydration   .  Nausea with vomiting 09/07/2016  . Hypokalemia 09/07/2016  . Abdominal pain 04/28/2016  . Enteritis 04/28/2016  . Acidosis 04/28/2016  . Prolonged Q-T interval on ECG 04/28/2016  . Hypomagnesemia 04/28/2016  . ETOH abuse 04/28/2016  . Abdominal pain, bilateral upper quadrant 04/28/2016  . Tachycardia 04/28/2016  . Tobacco abuse 04/28/2016  . Obesity 04/28/2016  . Fatty liver 04/28/2016  . Chronic back pain 04/28/2016  . Memory loss 10/25/2014  . Depression 10/25/2014  . Diabetic mononeuropathy associated with diabetes mellitus due to underlying condition (HCC) 10/25/2014  . Amnesia 10/13/2014  . Hypertension 10/13/2014  . Diabetes mellitus without complication (HCC) 10/13/2014  . Lumbago 10/13/2014   PCP:  Clayborn Heron, MD Pharmacy:   [facility] [phone] - [place], [place] - [address] AT [facility] OF [address] & [name] [place] [address] Phone: [phone] Fax: [phone]  [facility] [phone] [place] - [place] - [phone] [address] [place] [address] [place] [address] Phone: [phone] Fax: [phone]  [facility] - [address] ([place]), [place] - [address] [place] [address] Phone: [phone] Fax: [phone]  [facility] [place], [place] - [address] [place] [address] Phone: [phone] Fax: [phone]     Social Determinants of Health (SDOH) Interventions    Readmission Risk Interventions No flowsheet data found.

## 2019-04-15 DIAGNOSIS — F1010 Alcohol abuse, uncomplicated: Secondary | ICD-10-CM

## 2019-04-15 DIAGNOSIS — F17210 Nicotine dependence, cigarettes, uncomplicated: Secondary | ICD-10-CM

## 2019-04-15 DIAGNOSIS — I10 Essential (primary) hypertension: Secondary | ICD-10-CM

## 2019-04-15 DIAGNOSIS — Z95828 Presence of other vascular implants and grafts: Secondary | ICD-10-CM

## 2019-04-15 DIAGNOSIS — E1169 Type 2 diabetes mellitus with other specified complication: Principal | ICD-10-CM

## 2019-04-15 DIAGNOSIS — L97519 Non-pressure chronic ulcer of other part of right foot with unspecified severity: Secondary | ICD-10-CM

## 2019-04-15 LAB — CBC WITH DIFFERENTIAL/PLATELET
Abs Immature Granulocytes: 0.02 10*3/uL (ref 0.00–0.07)
Basophils Absolute: 0 10*3/uL (ref 0.0–0.1)
Basophils Relative: 0 %
Eosinophils Absolute: 0.1 10*3/uL (ref 0.0–0.5)
Eosinophils Relative: 2 %
HCT: 47.9 % (ref 39.0–52.0)
Hemoglobin: 15.5 g/dL (ref 13.0–17.0)
Immature Granulocytes: 0 %
Lymphocytes Relative: 23 %
Lymphs Abs: 1.7 10*3/uL (ref 0.7–4.0)
MCH: 30.3 pg (ref 26.0–34.0)
MCHC: 32.4 g/dL (ref 30.0–36.0)
MCV: 93.7 fL (ref 80.0–100.0)
Monocytes Absolute: 1 10*3/uL (ref 0.1–1.0)
Monocytes Relative: 14 %
Neutro Abs: 4.6 10*3/uL (ref 1.7–7.7)
Neutrophils Relative %: 61 %
Platelets: 195 10*3/uL (ref 150–400)
RBC: 5.11 MIL/uL (ref 4.22–5.81)
RDW: 13.1 % (ref 11.5–15.5)
WBC: 7.5 10*3/uL (ref 4.0–10.5)
nRBC: 0 % (ref 0.0–0.2)

## 2019-04-15 LAB — BASIC METABOLIC PANEL
Anion gap: 10 (ref 5–15)
BUN: 7 mg/dL (ref 6–20)
CO2: 26 mmol/L (ref 22–32)
Calcium: 8.9 mg/dL (ref 8.9–10.3)
Chloride: 104 mmol/L (ref 98–111)
Creatinine, Ser: 0.98 mg/dL (ref 0.61–1.24)
GFR calc Af Amer: >60 mL/min (ref >60)
GFR calc non Af Amer: >60 mL/min (ref >60)
Glucose, Bld: 165 mg/dL — ABNORMAL HIGH (ref 70–99)
Potassium: 3.7 mmol/L (ref 3.5–5.1)
Sodium: 140 mmol/L (ref 135–145)

## 2019-04-15 LAB — GLUCOSE, CAPILLARY
Glucose-Capillary: 117 mg/dL — ABNORMAL HIGH (ref 70–99)
Glucose-Capillary: 126 mg/dL — ABNORMAL HIGH (ref 70–99)
Glucose-Capillary: 114 mg/dL — ABNORMAL HIGH (ref 70–99)
Glucose-Capillary: 184 mg/dL — ABNORMAL HIGH (ref 70–99)

## 2019-04-15 LAB — C-REACTIVE PROTEIN: CRP: 1.2 mg/dL — ABNORMAL HIGH (ref <1.0)

## 2019-04-15 LAB — SEDIMENTATION RATE: Sed Rate: 2 mm/hr (ref 0–16)

## 2019-04-15 LAB — VANCOMYCIN, TROUGH: Vancomycin Tr: 14 ug/mL — ABNORMAL LOW (ref 15–20)

## 2019-04-15 MED ORDER — LISINOPRIL 20 MG PO TABS: 40.0000 mg | ORAL_TABLET | Freq: Every day | ORAL | Status: DC

## 2019-04-15 MED ORDER — SODIUM CHLORIDE 0.9 % IV SOLN: 2.0000 g | INTRAVENOUS | Status: DC

## 2019-04-15 MED ORDER — HYDROCODONE-ACETAMINOPHEN 5-325 MG PO TABS: 1.0000 | ORAL_TABLET | Freq: Four times a day (QID) | ORAL | 0 refills | Status: AC | PRN

## 2019-04-15 MED ORDER — ERTAPENEM IV (FOR PTA / DISCHARGE USE ONLY): 1.0000 g | INTRAVENOUS | 0 refills | Status: DC

## 2019-04-15 MED ORDER — METFORMIN HCL 500 MG PO TABS: 500.0000 mg | ORAL_TABLET | Freq: Two times a day (BID) | ORAL | 0 refills | Status: AC

## 2019-04-15 MED ORDER — METRONIDAZOLE 500 MG PO TABS: 500.0000 mg | ORAL_TABLET | Freq: Three times a day (TID) | ORAL | Status: DC

## 2019-04-15 MED ORDER — GABAPENTIN 100 MG PO CAPS: 100.0000 mg | ORAL_CAPSULE | Freq: Three times a day (TID) | ORAL | 0 refills | Status: AC

## 2019-04-15 MED ORDER — BASAGLAR KWIKPEN 100 UNIT/ML ~~LOC~~ SOPN: 20.0000 [IU] | PEN_INJECTOR | Freq: Every day | SUBCUTANEOUS | 0 refills | Status: DC

## 2019-04-15 MED ORDER — SODIUM CHLORIDE 0.9 % IV SOLN
1.0000 g | INTRAVENOUS | Status: DC
  Administered 2019-04-15 – 2019-04-17 (×3): 1000 mg via INTRAVENOUS
  Filled 2019-04-15 (×4): qty 1

## 2019-04-15 MED ORDER — LISINOPRIL 20 MG PO TABS
40.0000 mg | ORAL_TABLET | Freq: Every day | ORAL | Status: DC
  Administered 2019-04-16 – 2019-04-17 (×2): 40 mg via ORAL
  Filled 2019-04-15 (×2): qty 2

## 2019-04-15 MED ORDER — LISINOPRIL 40 MG PO TABS: 40.0000 mg | ORAL_TABLET | Freq: Every day | ORAL | 0 refills | Status: AC

## 2019-04-15 MED ORDER — DOXYCYCLINE HYCLATE 100 MG PO TABS: 100.0000 mg | ORAL_TABLET | Freq: Two times a day (BID) | ORAL | 0 refills | Status: DC

## 2019-04-15 MED ORDER — DOXYCYCLINE HYCLATE 100 MG PO TABS
100.0000 mg | ORAL_TABLET | Freq: Two times a day (BID) | ORAL | Status: DC
  Administered 2019-04-15 – 2019-04-17 (×5): 100 mg via ORAL
  Filled 2019-04-15 (×5): qty 1

## 2019-04-15 MED ORDER — METFORMIN HCL 500 MG PO TABS
500.0000 mg | ORAL_TABLET | Freq: Two times a day (BID) | ORAL | Status: DC
  Administered 2019-04-15 – 2019-04-17 (×5): 500 mg via ORAL
  Filled 2019-04-15 (×5): qty 1

## 2019-04-15 MED ORDER — INFLUENZA VAC SPLIT QUAD 0.5 ML IM SUSY
0.5000 mL | PREFILLED_SYRINGE | INTRAMUSCULAR | Status: AC
  Administered 2019-04-16: 0.5 mL via INTRAMUSCULAR
  Filled 2019-04-15: qty 0.5

## 2019-04-15 MED ORDER — GLIPIZIDE 5 MG PO TABS
2.5000 mg | ORAL_TABLET | Freq: Every day | ORAL | Status: DC
  Administered 2019-04-15 – 2019-04-17 (×3): 2.5 mg via ORAL
  Filled 2019-04-15 (×3): qty 1

## 2019-04-15 MED ORDER — AMLODIPINE BESYLATE 10 MG PO TABS: 10.0000 mg | ORAL_TABLET | Freq: Every day | ORAL | 0 refills | Status: AC

## 2019-04-15 MED ORDER — GLIPIZIDE 5 MG PO TABS: 2.5000 mg | ORAL_TABLET | Freq: Every day | ORAL | 0 refills | Status: AC

## 2019-04-15 MED ORDER — INSULIN GLARGINE 100 UNIT/ML ~~LOC~~ SOLN
20.0000 [IU] | Freq: Every day | SUBCUTANEOUS | Status: DC
  Administered 2019-04-15 – 2019-04-16 (×2): 20 [IU] via SUBCUTANEOUS
  Filled 2019-04-15 (×3): qty 0.2

## 2019-04-15 MED ORDER — SODIUM CHLORIDE 0.9 % IV SOLN: 1.0000 g | INTRAVENOUS | 0 refills | Status: DC

## 2019-04-15 MED FILL — AMLODIPINE BESYLATE 10 MG T: 10 | 30 days supply | Qty: 30 | Fill #0

## 2019-04-15 MED FILL — GABAPENTIN 100 MG CAPSULE: 100 | 30 days supply | Qty: 90 | Fill #0

## 2019-04-15 MED FILL — LISINOPRIL 40 MG TABS: 40 | 30 days supply | Qty: 30 | Fill #0

## 2019-04-15 MED FILL — DOXYCYCLINE HYCLATE 100 MG: 100 | 30 days supply | Qty: 84 | Fill #0

## 2019-04-15 MED FILL — BASAGLAR 100 UNIT/ML KWIKPE: 100 | 30 days supply | Qty: 6 | Fill #0

## 2019-04-15 MED FILL — metFORMIN HCL 500 MG TABS: 500 | 30 days supply | Qty: 60 | Fill #0

## 2019-04-15 MED FILL — glipiZIDE 5 MG TABS: 5 | 30 days supply | Qty: 15 | Fill #0

## 2019-04-15 NOTE — Progress Notes (Signed)
PHARMACY CONSULT NOTE FOR:  OUTPATIENT  PARENTERAL ANTIBIOTIC THERAPY (OPAT)  Indication:Right great toe osteomyelitis  Regimen: Ertapenem 1 gm every 24 hours  End date: 05/26/2019  IV antibiotic discharge orders are pended. To discharging provider:  please sign these orders via discharge navigator,  Select New Orders & click on the button choice - Manage This Unsigned Work.     Thank you for allowing pharmacy to be a part of this patient's care.  Jimmy Footman, PharmD, BCPS, BCIDP Infectious Diseases Clinical Pharmacist Phone: [phone] 04/15/2019, 1:10 PM

## 2019-04-15 NOTE — Progress Notes (Signed)
PROGRESS NOTE  Chris Smith  DOB: [birth year]  PCP: Clayborn Heron, MD XTK:[id number]  DOA: 04/11/2019  LOS: 4 days   Brief narrative: Chris Smith is a 43 y.o. male with PMH of type 2 diabetes mellitus for last 10 years, chronic back pain, hypertension, alcoholism, tobacco use.  Patient presented to the ED on 10/9 with complaint of nonhealing right foot ulcer. Last hospitalization 9/8-9/11 for sepsis secondary to right foot ulcer.  MRI of right foot on that admission showed, early osteomyelitis.  He was started on IV antibiotics.  Patient wanted to try nonoperative nature because he was afraid to lose his toe.  Long-term IV antibiotics was planned.  However patient signed out AMA on 9/11. Patient states right foot also has not improved and he presented to the ED on 10/9 for the same.  No fever. In the ED, patient was not febrile, blood pressure elevated up to 169/124. WBC count normal at 9.3, lactic acid normal at 1.7, renal function normal. Patient was admitted for management of diabetic foot ulcer.  Subjective: Patient was seen and examined this morning.  For now patient had chosen to avoid amputation of right great toe.  He would like to see how the antibiotics helps.  ID consultation was obtained.  IV Invanz daily was recommended. Patient also asking for pain medicine for a few days.  Assessment/Plan: Nonhealing diabetic foot ulcer on the right Early osteomyelitis per MRI from a month ago -Not septic. -started on broad-spectrum antibiotics.  No growth on culture. -Seen by orthopedics Dr. Lajoyce Corners.  Recommended surgical amputation of the great toe.  Patient would like to avoid surgery at this time and would like to try IV antibiotics.   -PICC line obtained 10/12.  Per ID recommendation patient will discharge on daily for 6 weeks. -Case management working to get home health set up for him.  Type 2 diabetes mellitus for last 10 years. -A1c 8.8 on 9/8 -Takes Lantus at home but  inconsistent doses because he does not have medical insurance and was rationing insulin. -Currently on Lantus 20 units nightly with sliding scale and Accu-Cheks.  I also added metformin 5 mg twice daily and glipizide 2.5 mg daily to Lantus.  Patient states that he will not be able to 3 times a day per meal insulin.   -I started the patient on Neurontin.  -Lipid panel shows HDL 41, LDL 80  Hypertension -Blood pressure elevated up to 150s today.. -I noticed that he is only on lisinopril 20 mg daily at home. -I added amlodipine 10 mg daily and increase the dose of lisinopril from 20 mg to 40 mg daily.  Chronic back pain -On ibuprofen at home. -Currently on Vicodin 5/325 1 tab q4hrs as needed.  Continue the same.  Alcohol abuse/tobacco abuse -Counseled to quit. -Not on withdrawal symptoms at this time.  No medical insurance -Medication assistance may be required at discharge.  Mobility: Needs PT evaluation after surgery DVT prophylaxis:  On Lovenox Code Status:   Code Status: Full Code  Family Communication:  Expected Discharge:  Pending surgical evaluation  Consultants:  Orthopedic surgery  Procedures:  None  Antimicrobials: Anti-infectives (From admission, onward)   Start     Dose/Rate Route Frequency Ordered Stop   04/15/19 1700  ertapenem (INVANZ) 1,000 mg in sodium chloride 0.9 % 100 mL IVPB     1 g 200 mL/hr over 30 Minutes Intravenous Every 24 hours 04/15/19 1034     04/15/19 1600  cefTRIAXone (ROCEPHIN) 2  g in sodium chloride 0.9 % 100 mL IVPB  Status:  Discontinued     2 g 200 mL/hr over 30 Minutes Intravenous Every 24 hours 04/15/19 0958 04/15/19 1034   04/15/19 1400  metroNIDAZOLE (FLAGYL) tablet 500 mg  Status:  Discontinued     500 mg Oral Every 8 hours 04/15/19 0958 04/15/19 1034   04/15/19 1400  doxycycline (VIBRA-TABS) tablet 100 mg     100 mg Oral Every 12 hours 04/15/19 1036     04/12/19 0600  vancomycin (VANCOCIN) 2,000 mg in sodium chloride 0.9 % 500 mL  IVPB  Status:  Discontinued     2,000 mg 250 mL/hr over 120 Minutes Intravenous Every 12 hours 04/11/19 1708 04/15/19 0958   04/11/19 2330  piperacillin-tazobactam (ZOSYN) IVPB 3.375 g  Status:  Discontinued     3.375 g 12.5 mL/hr over 240 Minutes Intravenous Every 8 hours 04/11/19 1704 04/15/19 0958   04/11/19 1715  vancomycin (VANCOCIN) 1,500 mg in sodium chloride 0.9 % 500 mL IVPB     1,500 mg 250 mL/hr over 120 Minutes Intravenous  Once 04/11/19 1707 04/11/19 2101   04/11/19 1445  piperacillin-tazobactam (ZOSYN) IVPB 3.375 g     3.375 g 100 mL/hr over 30 Minutes Intravenous  Once 04/11/19 1435 04/11/19 1716   04/11/19 1445  vancomycin (VANCOCIN) IVPB 1000 mg/200 mL premix     1,000 mg 200 mL/hr over 60 Minutes Intravenous  Once 04/11/19 1435 04/11/19 1717      Diet Order            Diet Carb Modified Fluid consistency: Thin; Room service appropriate? Yes  Diet effective now              Infusions:  . sodium chloride    . ertapenem      Scheduled Meds: . amLODipine  10 mg Oral Daily  . Chlorhexidine Gluconate Cloth  6 each Topical Daily  . doxycycline  100 mg Oral Q12H  . enoxaparin (LOVENOX) injection  40 mg Subcutaneous Q24H  . gabapentin  100 mg Oral TID  . glipiZIDE  2.5 mg Oral QAC breakfast  . [START ON 04/16/2019] influenza vac split quadrivalent PF  0.5 mL Intramuscular Tomorrow-1000  . insulin aspart  0-5 Units Subcutaneous QHS  . insulin aspart  0-9 Units Subcutaneous TID WC  . insulin glargine  20 Units Subcutaneous QHS  . [START ON 04/16/2019] lisinopril  40 mg Oral Daily  . metFORMIN  500 mg Oral BID WC  . sodium chloride flush  10-40 mL Intracatheter Q12H  . sodium chloride flush  3 mL Intravenous Q12H    PRN meds: sodium chloride, acetaminophen, ALPRAZolam, hydrALAZINE, HYDROcodone-acetaminophen, morphine injection, ondansetron **OR** ondansetron (ZOFRAN) IV, sodium chloride flush, sodium chloride flush   Objective: Vitals:   04/15/19 0332  04/15/19 0753  BP: (!) 147/88 (!) 158/106  Pulse: 74 82  Resp: 18 16  Temp: (!) 97.4 F (36.3 C) 98 F (36.7 C)  SpO2: 99% 100%    Intake/Output Summary (Last 24 hours) at 04/15/2019 1412 Last data filed at 04/15/2019 1300 Gross per 24 hour  Intake 960 ml  Output -  Net 960 ml   Filed Weights   04/11/19 1754  Weight: 105.1 kg   Weight change:  Body mass index is 29.76 kg/m.   Physical Exam: General exam: Appears calm and comfortable.  Skin: No rashes, lesions or ulcers. HEENT: Atraumatic, normocephalic, supple neck, no obvious bleeding Lungs: Clear to auscultation bilaterally CVS: Regular rate  and rhythm, no murmur GI/Abd soft, nontender, nondistended, bowel sound present CNS: Alert, awake, oriented x3 Psychiatry: Anxious,  Extremities: No pedal edema, no calf tenderness, right foot first toe ulcer, base looks cleaner today.  Data Review: I have personally reviewed the laboratory data and studies available.  Recent Labs  Lab 04/11/19 1145 04/12/19 0929 04/13/19 0219 04/14/19 0620 04/15/19 0430  WBC 9.3 7.9 6.1 6.9 7.5  NEUTROABS 4.9 5.2 3.2 3.3 4.6  HGB 16.5 16.3 16.3 17.1* 15.5  HCT 51.0 47.0 50.0 52.5* 47.9  MCV 93.8 89.5 92.3 92.3 93.7  PLT 235 203 200 222 195   Recent Labs  Lab 04/11/19 1145 04/12/19 0929 04/13/19 0219 04/14/19 0620 04/15/19 0430  NA 139 135 139 139 140  K 3.9 3.5 3.8 4.0 3.7  CL 101 99 101 104 104  CO2 28 25 27 27 26   GLUCOSE 294* 176* 199* 123* 165*  BUN <5* 5* 6 5* 7  CREATININE 0.73 0.61 0.64 0.70 0.98  CALCIUM 8.8* 8.7* 8.8* 9.0 8.9    Lorin GlassBinaya Luke Rigsbee, MD  [facility] 04/15/2019

## 2019-04-15 NOTE — Progress Notes (Signed)
Pharmacy Antibiotic Note  Chris Smith is a 43 y.o. male admitted on 04/11/2019 with a diabetic foot infection. Pt has a left foot ulcer on the first toe, awaiting orthopedic surgery evaluation. Pt was previously admitted 1 month ago for same reason. He received 3 weeks of levoquin, however the wound has expanded. Per patient, he weighs 265 lbs. Pharmacy has been consulted for vancomycin and zosyn dosing.  WBC is wnls, he is afebrile, lactate is 1.7, and vital signs are stable. Of note, patient remains hyperglycemic.   10/13 AM update:  AUC is therapeutic at 458 Renal function OK WBC WNL  Plan: Cont vancomycin 2000 mg IV q12h -Re-check vancomycin levels as needed Cont Zosyn 3.375G IV q8h to be infused over 4 hours   Height: 6\' 2"  (188 cm) Weight: 231 lb 12.8 oz (105.1 kg) IBW/kg (Calculated) : 82.2  Temp (24hrs), Avg:98 F (36.7 C), Min:97.4 F (36.3 C), Max:98.8 F (37.1 C)  Recent Labs  Lab 04/11/19 1145 04/11/19 1540 04/11/19 1730 04/12/19 0929 04/13/19 0219 04/13/19 2213 04/14/19 0620 04/15/19 0430  WBC 9.3  --   --  7.9 6.1  --  6.9 7.5  CREATININE 0.73  --   --  0.61 0.64  --  0.70 0.98  LATICACIDVEN  --  1.7 1.6  --   --   --   --   --   VANCOTROUGH  --   --   --   --   --   --  20 14*  VANCOPEAK  --   --   --   --   --  23*  --   --     Estimated Creatinine Clearance: 126.9 mL/min (by C-G formula based on SCr of 0.98 mg/dL).    No Known Allergies  Narda Bonds, PharmD, BCPS Clinical Pharmacist Phone: [phone]

## 2019-04-15 NOTE — TOC Progression Note (Addendum)
Transition of Care [facility]) - Progression Note    Patient Details  Name: Chris Smith MRN: [id number] Date of Birth: 1975/10/09  [facility] CM/SW [place], LCSW Phone Number: 04/15/2019, 9:49 AM  Clinical Narrative:    10am-CSW contacted this week's home health charity, [facility]vis Lemmings, who reported that [facility] was in fact the assigned charity agency for yesterday and that CSW should reach out to them. CSW contacted [facility], who is assisting CSW with locating a home health RN. [place] also declined patient due to lack of insurance. [place] letter reinstated for [facility] pharmacy to deliver meds to bedside.   5pm-Pam with [facility] assisting CSW with arranging for patient to come daily to short stay to receive the IV antibiotic dose due to patient not being able to get home health services (lack of insurance) and not having home support. CSW will follow up tomorrow with required forms.    Expected Discharge Plan: [place] Barriers to Discharge: Continued Medical Work up, Inadequate or no insurance  Expected Discharge Plan and Services Expected Discharge Plan: [place] In-house Referral: NA Discharge Planning Services: CM Consult Post Acute Care Choice: Durable Medical Equipment, Home Health Living arrangements for the past 2 months: Single Family Home                 DME Arranged: IV pump/equipment DME Agency: Other - Comment([facility]) Date DME Agency Contacted: 04/14/19 Time DME Agency Contacted: [phone] Representative spoke with at DME Agency: [place]: RN           Social Determinants of Health ([place]) Interventions    Readmission Risk Interventions No flowsheet data found.

## 2019-04-15 NOTE — Consult Note (Addendum)
[facility]    Date of Admission:  04/11/2019     Total days of antibiotics: 6         Reason for Consult: Antibiotic regimen    Referring Provider: Lorin GlassBinaya Dahal, MD Primary Care Provider: None  Assessment: In summary, Mr. Chris Smith is a 43 year old male with a past medical history significant for T2DM complicated by peripheral neuropathy and right great toe osteomyelitis, HTN, alcohol and tobacco abuse who presented on 10/9 for worsening osteomyelitis.  At this point, recommendation would be to amputate.  However, the patient does not want surgical intervention at this time, and wants to continue with IV antibiotics.  Plan: 1. D/c vancomycin and Zosyn 2. Start ertapenem 1g q24hrs + Doxycycline 100 mg q12 hrs today. Will need 6 weeks total of antibiotics through the PICC line. 3. The patient will need follow-up with wound care. 4. The patient needs PCP to help with diabetes control. 5. May benefit from an offloading boot.  Active Problems:   Diabetic foot ulcer (HCC)   Osteomyelitis of great toe of right foot (HCC)   Diabetic polyneuropathy associated with type 2 diabetes mellitus (HCC)   . amLODipine  10 mg Oral Daily  . Chlorhexidine Gluconate Cloth  6 each Topical Daily  . enoxaparin (LOVENOX) injection  40 mg Subcutaneous Q24H  . gabapentin  100 mg Oral TID  . glipiZIDE  2.5 mg Oral QAC breakfast  . [START ON 04/16/2019] influenza vac split quadrivalent PF  0.5 mL Intramuscular Tomorrow-1000  . insulin aspart  0-5 Units Subcutaneous QHS  . insulin aspart  0-9 Units Subcutaneous TID WC  . insulin glargine  20 Units Subcutaneous QHS  . [START ON 04/16/2019] lisinopril  40 mg Oral Daily  . metFORMIN  500 mg Oral BID WC  . metroNIDAZOLE  500 mg Oral Q8H  . sodium chloride flush  10-40 mL Intracatheter Q12H  . sodium chloride flush  3 mL Intravenous Q12H    HPI: Chris Smith is a 43 y.o. male with a past history significant for uncontrolled  type 2 diabetes with peripheral neuropathy, HTN, alcohol abuse, tobacco use disorder, who presented on 10/9 for worsening osteomyelitis of the right great toe. The patient was initially hospitalized on 9/8-9/10 for right great toe osteomyelitis, confirmed by MRI on 9/8. At that time it was recommended the patient undergo a amputation of the toe, however the patient elected for long-term antibiotics in an effort to preserve the toe.  The patient left AMA on 9/10 and says he took levofloxacin orally up until this current admission.  Since his last hospitalization his toes gotten worse.  He has had increased drainage and further breakdown of the skin. Has been seen by Dr. Lajoyce Cornersuda with orthopedics who recommends surgical amputation of the toe. The patient still would like to avoid surgery and try IV antibiotics. The patient is currently on vancomycin and Zosyn. A PICC line has been placed in the right arm.   The patient has denied having fevers, changes in his breathing, or changes in his bowel and bladder habits.  Review of Systems: All systems were reviewed and are otherwise negative unless mentioned in the HPI.   Past Medical History:  Diagnosis Date  . Acute respiratory failure (HCC) 09/2014  . Amnesia   . Aspiration pneumonia (HCC) 09/2014  . Chronic back pain   . Diabetes mellitus without complication (HCC)   . Encephalopathy 09/2014  . ETOH abuse   .  Hypertension   . Ischemic hepatitis unknown  . Rhabdomyolysis unknown  . Sciatica    Social History   Tobacco Use  . Smoking status: Current Every Day Smoker    Packs/day: 0.50    Years: 18.00    Pack years: 9.00    Types: Cigarettes  . Smokeless tobacco: Never Used  Substance Use Topics  . Alcohol use: Yes    Alcohol/week: 0.0 standard drinks    Comment: 3 OR 4 DAYS PER WEEK  . Drug use: No    Family History  Problem Relation Age of Onset  . Cancer Mother        cause of death, ovarian & colon  . Diabetes Father   . Hypertension  Father   . Gout Father   . Hyperlipidemia Father    No Known Allergies  OBJECTIVE: Blood pressure (!) 158/106, pulse 82, temperature 98 F (36.7 C), temperature source Oral, resp. rate 16, height 6\' 2"  (1.88 m), weight 105.1 kg, SpO2 100 %.  Physical Exam Vitals signs reviewed.  Constitutional:      General: He is not in acute distress.    Appearance: Normal appearance. He is not ill-appearing, toxic-appearing or diaphoretic.  HENT:     Head: Normocephalic and atraumatic.  Eyes:     General: No scleral icterus.       Right eye: No discharge.        Left eye: No discharge.     Extraocular Movements: Extraocular movements intact.  Cardiovascular:     Rate and Rhythm: Normal rate and regular rhythm.     Pulses: Normal pulses.     Heart sounds: Normal heart sounds. No murmur. No friction rub. No gallop.   Pulmonary:     Effort: Pulmonary effort is normal. No respiratory distress.     Breath sounds: Normal breath sounds. No wheezing or rales.  Abdominal:     General: Abdomen is flat. Bowel sounds are normal. There is no distension.     Palpations: Abdomen is soft.     Tenderness: There is no abdominal tenderness. There is no guarding.  Musculoskeletal:     Comments: Right great toe ulcer present  Neurological:     General: No focal deficit present.     Mental Status: He is alert and oriented to person, place, and time.    Lab Results Lab Results  Component Value Date   WBC 7.5 04/15/2019   HGB 15.5 04/15/2019   HCT 47.9 04/15/2019   MCV 93.7 04/15/2019   PLT 195 04/15/2019    Lab Results  Component Value Date   CREATININE 0.98 04/15/2019   BUN 7 04/15/2019   NA 140 04/15/2019   K 3.7 04/15/2019   CL 104 04/15/2019   CO2 26 04/15/2019    Lab Results  Component Value Date   ALT 33 04/11/2019   AST 48 (H) 04/11/2019   ALKPHOS 71 04/11/2019   BILITOT 0.4 04/11/2019    Microbiology: Recent Results (from the past 240 hour(s))  Culture, blood (routine x 2)      Status: None (Preliminary result)   Collection Time: 04/11/19  3:26 PM   Specimen: BLOOD RIGHT ARM  Result Value Ref Range Status   Specimen Description BLOOD RIGHT ARM  Final   Special Requests   Final    BOTTLES DRAWN AEROBIC AND ANAEROBIC Blood Culture adequate volume   Culture   Final    NO GROWTH 3 DAYS Performed at [facility], [address], [place], [place] [address]    Report Status PENDING  Incomplete  Culture, blood (routine x 2)     Status: None (Preliminary result)   Collection Time: 04/11/19  3:34 PM   Specimen: BLOOD LEFT ARM  Result Value Ref Range Status   Specimen Description BLOOD LEFT ARM  Final   Special Requests   Final    BOTTLES DRAWN AEROBIC AND ANAEROBIC Blood Culture adequate volume   Culture   Final    NO GROWTH 3 DAYS Performed at [facility], [place] [address] [place], [place] [address]    Report Status PENDING  Incomplete  SARS CORONAVIRUS 2 (TAT 6-24 HRS) Nasopharyngeal Nasopharyngeal Swab     Status: None   Collection Time: 04/11/19  3:45 PM   Specimen: Nasopharyngeal Swab  Result Value Ref Range Status   SARS Coronavirus 2 NEGATIVE NEGATIVE Final    Comment: (NOTE) SARS-CoV-2 target nucleic acids are NOT DETECTED. The SARS-CoV-2 RNA is generally detectable in upper and lower respiratory specimens during the acute phase of infection. Negative results do not preclude SARS-CoV-2 infection, do not rule out co-infections with other pathogens, and should not be used as the sole basis for treatment or other patient management decisions. Negative results must be combined with clinical observations, patient history, and epidemiological information. The expected result is Negative. Fact Sheet for Patients: SugarRoll.be Fact Sheet for Healthcare Providers: https://www.woods-mathews.com/ This test is not yet approved or cleared by the [place] FDA and  has been authorized for detection and/or  diagnosis of SARS-CoV-2 by FDA under an Emergency Use Authorization (EUA). This EUA will remain  in effect (meaning this test can be used) for the duration of the COVID-19 declaration under Section 56 4(b)(1) of the Act, 21 U.S.C. section 360bbb-3(b)(1), unless the authorization is terminated or revoked sooner. Performed at [facility], [place] [address], [place], [place] [address]   Wound or Superficial Culture     Status: None   Collection Time: 04/11/19  3:47 PM   Specimen: Wound  Result Value Ref Range Status   Specimen Description WOUND RIGHT TOE  Final   Special Requests Immunocompromised  Final   Gram Stain   Final    RARE WBC PRESENT, PREDOMINANTLY PMN FEW GRAM POSITIVE COCCI RARE GRAM POSITIVE RODS    Culture   Final    NORMAL SKIN FLORA Performed at [facility], [address] [place], [place] [address]    Report Status 04/13/2019 FINAL  Final    Earlene Plater, MD Internal Medicine, PGY1 Pager: [phone]  04/15/2019,12:52 PM'

## 2019-04-15 NOTE — Plan of Care (Signed)

## 2019-04-16 ENCOUNTER — Ambulatory Visit: Payer: Self-pay | Admitting: Internal Medicine

## 2019-04-16 LAB — GLUCOSE, CAPILLARY
Glucose-Capillary: 148 mg/dL — ABNORMAL HIGH (ref 70–99)
Glucose-Capillary: 90 mg/dL (ref 70–99)
Glucose-Capillary: 166 mg/dL — ABNORMAL HIGH (ref 70–99)
Glucose-Capillary: 167 mg/dL — ABNORMAL HIGH (ref 70–99)

## 2019-04-16 MED ORDER — PEN NEEDLES 31G X 8 MM MISC: 0 refills | Status: AC

## 2019-04-16 MED ORDER — HEPARIN SOD (PORK) LOCK FLUSH 100 UNIT/ML IV SOLN
250.0000 [IU] | INTRAVENOUS | Status: AC | PRN
  Administered 2019-04-17: 250 [IU]
  Filled 2019-04-16: qty 2.5

## 2019-04-16 MED FILL — PENTIPS 31G X 8 MM MISC: 31G X 8 MM | 30 days supply | Qty: 100 | Fill #0

## 2019-04-16 NOTE — TOC Progression Note (Signed)
Transition of Care [facility]) - Progression Note    Patient Details  Name: Chris Smith MRN: [id number] Date of Birth: Oct 20, 1975  Transition of Care [facility]) CM/SW [place], LCSW Phone Number: 04/16/2019, 4:45 PM  Clinical Narrative:    12pm-Pam with [employer] contacted CSW and provided IV infusion request form for MD to sign and fax over to Short Stay and provided contact info to schedule the IV infusion for tomorrow.    1pm-CSW contacted short stay, [place], however they do not have any availability until Monday at [place] will check on the availability of IV team and contact CSW back. She also reports that they are closed on the weekend and patient would have to get the IV through the ED. CSW contacted [place], [place]yse [place], for guidance.   3pm-CSW spoke with patient who reports being upset that he is not able to go home today. CSW explained situation. He questioned why he could not just go to the ED to get it tomorrow. CSW explained that it would not be a long term solution and he needs a safe plan. Patient reported understanding. CSW spoke with [facility] Supervisor who has involved [place] medical advisor, Dr. Hulen Skains. He is hoping to find a solution for patient.    4:30pm-CSW provided update to patient, who states he is in agreement with staying until tomorrow with hopes that a plan will come together.   Expected Discharge Plan: Home/Self Care Barriers to Discharge: Equipment Delay  Expected Discharge Plan and Services Expected Discharge Plan: Home/Self Care In-house Referral: NA Discharge Planning Services: CM Consult Post Acute Care Choice: Durable Medical Equipment, Home Health Living arrangements for the past 2 months: Single Family Home Expected Discharge Date: 04/16/19               DME Arranged: IV pump/equipment DME Agency: Other - Comment([facility]) Date DME Agency Contacted: 04/14/19 Time DME Agency Contacted: [phone] Representative spoke with at DME Agency: [place]: RN [place] Agency: [facility] [employer]) Date [place]: 04/15/19 Time [place]: [phone] Representative spoke with at [place]: [place] ([place]) Interventions    Readmission Risk Interventions No flowsheet data found.

## 2019-04-16 NOTE — Progress Notes (Signed)
PICC flush consult: RN reports pt needs IV medication prior to discharge. Will re-enter consult when ready for discharge.

## 2019-04-16 NOTE — Discharge Summary (Addendum)
Physician Discharge Summary  Chris Smith NWG:[id number] DOB: June 28, 1976 DOA: 04/11/2019  PCP: Aretta Nip, MD  Admit date: 04/11/2019 Discharge date: 04/16/2019  Admitted From: Home Discharge disposition: Home with IV antibiotics   Code Status: Full Code  Diet Recommendation: Cardiac and diabetic diet   Recommendations for Outpatient Follow-Up:   1. Follow-up with outpatient antibiotic infusion center 2. Follow-up with PCP 3. Recommend to return to ED if wound is worsening or not responding to antibiotics.  Discharge Diagnosis: Yes 28 no 26-year 47 however waiting for family 43 year old   Active Problems:   Diabetic foot ulcer ([place])   Osteomyelitis of great toe of right foot ([place])   Diabetic polyneuropathy associated with type 2 diabetes mellitus ([place])  History of Present Illness / Brief narrative:  Chris Smith is a 43 y.o. male with PMH of type 2 diabetes mellitus for last 10 years, chronic back pain, hypertension, alcoholism, tobacco use.  Patient presented to the ED on 10/9 with complaint of nonhealing right foot ulcer. Last hospitalization 9/8-9/11 for sepsis secondary to right foot ulcer.  MRI of right foot on that admission showed, early osteomyelitis.  He was started on IV antibiotics.  Patient wanted to try nonoperative nature because he was afraid to lose his toe.  Long-term IV antibiotics was planned.  However patient signed out AMA on 9/11. Patient states right foot also has not improved and he presented to the ED on 10/9 for the same.  No fever. In the ED, patient was not febrile, blood pressure elevated up to 169/124. WBC count normal at 9.3, lactic acid normal at 1.7, renal function normal. Patient was admitted for management of diabetic foot ulcer.   Subjective:  Seen and examined this afternoon.  Walking on the hallway.  Not in distress.  Feels grateful for the care.  He states that he will be back soon to get the surgery done.  Hospital Course:   Nonhealing diabetic foot ulcer on the right Early osteomyelitis per MRI from a month ago -Not septic. -started on broad-spectrum antibiotics.  No growth on culture. -Seen by orthopedics Dr. Sharol Given.  Recommended surgical amputation of the great toe.  Patient would like to avoid surgery at this time and would like to try IV antibiotics.   -ID consultation was obtained. -PICC line obtained 10/12.  Per ID recommendation IV Invanz 1 g daily for 6 weeks as well as oral doxycycline. -Case management has set him up for daily antibiotic infusion at 'Short Stay'.  Type 2 diabetes mellitus for last 10 years. -A1c 8.8 on 9/8 -Takes Lantus at home but inconsistent doses because he does not have medical insurance and was rationing insulin. -Currently on Lantus 20 units nightly with sliding scale and Accu-Cheks.  I also added metformin 5 mg twice daily and glipizide 2.5 mg daily. -started the patient on Neurontin as well. -Lipid panel shows HDL 41, LDL 80  Hypertension -Blood pressure elevated up to 150s.. -Noticed that he is only on lisinopril 20 mg daily at home. -Added amlodipine 10 mg daily and increased the dose of lisinopril from 20 mg to 40 mg daily.  Chronic back pain -On ibuprofen at home. -Currently on Vicodin 5/325 1 tab q4hrs as needed.  Continue the same.  Alcohol abuse/tobacco abuse -Counseled to quit. -Not on withdrawal symptoms at this time.  No medical insurance -Medication assistance provided at discharge.  I told him the prescription for all blood pressure and diabetes medication including Lantus.  Filled for a month  supply at transitional care pharmacy.  Discharge Exam:   Vitals:   04/15/19 1538 04/15/19 1926 04/16/19 0425 04/16/19 0843  BP: (!) 149/111 (!) 148/102 (!) 145/91 (!) 157/98  Pulse: 70 76 72 82  Resp: '16 18 18 17  ' Temp: 97.8 F (36.6 C) 97.9 F (36.6 C) 98.8 F (37.1 C) 98.2 F (36.8 C)  TempSrc: Oral Oral  Oral  SpO2: 100% 98% 100% 98%  Weight:       Height:        Body mass index is 29.76 kg/m.  General exam: Appears calm and comfortable.  Skin: No rashes, lesions or ulcers. HEENT: Atraumatic, normocephalic, supple neck, no obvious bleeding Lungs: Clear to auscultation bilaterally CVS: Regular rate and rhythm, no murmur GI/Abd soft, nontender, nondistended, bowel sound present CNS: Alert, awake and oriented x3 Psychiatry: Mood appropriate Extremities: No pedal edema, no calf tenderness, right foot ulcer bandaged.  Discharge Instructions:  Wound care: Wound care instruction provided. Discharge Instructions    Face-to-face encounter (required for Medicare/Medicaid patients)   Complete by: As directed    I Ardice Boyan certify that this patient is under my care and that I, or a nurse practitioner or physician's assistant working with me, had a face-to-face encounter that meets the physician face-to-face encounter requirements with this patient on 04/15/2019. The encounter with the patient was in whole, or in part for the following medical condition(s) which is the primary reason for home health care (List medical condition): Uncontrolled diabetes mellitus, diabetic foot ulcer, osteomyelitis.   The encounter with the patient was in whole, or in part, for the following medical condition, which is the primary reason for home health care: DM, diabetic foot ilcer, osteomyelitis   I certify that, based on my findings, the following services are medically necessary home health services: Nursing   Reason for Medically Necessary Home Health Services: Skilled Nursing- Change/Decline in Patient Status   My clinical findings support the need for the above services: Pain interferes with ambulation/mobility   Further, I certify that my clinical findings support that this patient is homebound due to: Pain interferes with ambulation/mobility   Home Health   Complete by: As directed    To provide the following care/treatments: RN   Home infusion  instructions [facility] May follow [place] Dosing Protocol; May administer Cathflo as needed to maintain patency of vascular access device.; [place] of vascular access device: per [facility] Protocol: 0.9% NaCl pre/post medica...   Complete by: As directed    Instructions: May follow [place] Dosing Protocol   Instructions: May administer Cathflo as needed to maintain patency of vascular access device.   Instructions: Flushing of vascular access device: per [facility] Protocol: 0.9% NaCl pre/post medication administration and prn patency; Heparin 100 u/ml, 61m for implanted ports and Heparin 10u/ml, 515mfor all other central venous catheters.   Instructions: May follow [facility] Anaphylaxis Protocol for First Dose Administration in the home: 0.9% NaCl at 25-50 ml/hr to maintain IV access for protocol meds. Epinephrine 0.3 ml IV/IM PRN and Benadryl 25-50 IV/IM PRN s/s of anaphylaxis.   Instructions: AdKnob Nosternfusion Coordinator (RN) to assist per patient IV care needs in the home PRN.   Home infusion instructions [facility] May follow [facility]osing Protocol; May administer Cathflo as needed to maintain patency of vascular access device.; [place] of vascular access device: [facility]: 0.9% NaCl pre/post medica...   Complete by: As directed    Instructions: [facility]   Instructions: May administer Cathflo as needed to maintain patency of vascular access device.   Instructions: Flushing of vascular access device: per [facility] Protocol: 0.9% NaCl pre/post medication administration and prn patency; Heparin 100 u/ml, 46m for implanted ports and Heparin 10u/ml, 559mfor all other central venous catheters.   Instructions: May follow [facility] Anaphylaxis Protocol for First Dose Administration in the home: 0.9% NaCl at 25-50 ml/hr to maintain IV access for protocol meds. Epinephrine 0.3 ml IV/IM PRN and Benadryl 25-50 IV/IM PRN s/s of anaphylaxis.   Instructions:  AdEugenenfusion Coordinator (RN) to assist per patient IV care needs in the home PRN.   Increase activity slowly   Complete by: As directed      Follow-up Information    DuNewt MinionMD Follow up in 2 week(s).   Specialty: Orthopedic Surgery Contact information: [id number]        Allergies as of 04/16/2019   No Known Allergies     Medication List    STOP taking these medications   ibuprofen 200 MG tablet Commonly known as: ADVIL   insulin aspart protamine- aspart (70-30) 100 UNIT/ML injection Commonly known as: NovoLOG Mix 70/30   insulin glargine 100 UNIT/ML injection Commonly known as: LANTUS Replaced by: Basaglar KwikPen 100 UNIT/ML Sopn   levofloxacin 750 MG tablet Commonly known as: LEVAQUIN     TAKE these medications   acetaminophen 325 MG tablet Commonly known as: TYLENOL Take 650 mg by mouth every 6 (six) hours as needed for moderate pain.   amLODipine 10 MG tablet Commonly known as: NORVASC Take 1 tablet (10 mg total) by mouth daily.   Basaglar KwikPen 100 UNIT/ML Sopn Inject 0.2 mLs (20 Units total) into the skin daily. Replaces: insulin glargine 100 UNIT/ML injection   doxycycline 100 MG tablet Commonly known as: VIBRA-TABS Take 1 tablet (100 mg total) by mouth every 12 (twelve) hours.   ertapenem  IVPB Commonly known as: INVANZ Inject 1 g into the vein daily. Indication:  Diabetic foot infection Last Day of Therapy:  05/26/2019 Labs - Once weekly:  CBC/D and BMP, Labs - Every other week:  ESR and CRP   gabapentin 100 MG capsule Commonly known as: NEURONTIN Take 1 capsule (100 mg total) by mouth 3 (three) times daily.   glipiZIDE 5 MG tablet Commonly known as: GLUCOTROL Take 0.5 tablets (2.5 mg total) by mouth daily before breakfast.   HYDROcodone-acetaminophen 5-325 MG tablet Commonly known as: NORCO/VICODIN Take 1 tablet by mouth every 6 (six) hours as needed for up  to 5 days for moderate pain.   lisinopril 40 MG tablet Commonly known as: ZESTRIL Take 1 tablet (40 mg total) by mouth daily. What changed:   medication strength  how much to take   metFORMIN 500 MG tablet Commonly known as: GLUCOPHAGE Take 1 tablet (500 mg total) by mouth 2 (two) times daily with a meal.   MULTIVITAMIN ADULT PO Take 1 tablet by mouth daily.   Pen Needles 31G X 8 MM Misc Use as directed            Home Infusion Instuctions  (From admission, onward)         Start     Ordered   04/15/19 0000  Home infusion instructions [facility] May follow [facility]osing Protocol; May administer Cathflo as needed to maintain patency of vascular access device.; Flushing of vascular access  device: per [facility] Protocol: 0.9% NaCl pre/post medica...    Question Answer Comment  Instructions May follow [place] Dosing Protocol   Instructions May administer Cathflo as needed to maintain patency of vascular access device.   Instructions Flushing of vascular access device: per [facility] Protocol: 0.9% NaCl pre/post medication administration and prn patency; Heparin 100 u/ml, 63m for implanted ports and Heparin 10u/ml, 542mfor all other central venous catheters.   Instructions May follow [facility] Anaphylaxis Protocol for First Dose Administration in the home: 0.9% NaCl at 25-50 ml/hr to maintain IV access for protocol meds. Epinephrine 0.3 ml IV/IM PRN and Benadryl 25-50 IV/IM PRN s/s of anaphylaxis.   Instructions [facility] Infusion Coordinator (RN) to assist per patient IV care needs in the home PRN.      04/15/19 1436   04/15/19 0000  Home infusion instructions [facility] May follow ACE[facility]osing Protocol; May administer Cathflo as needed to maintain patency of vascular access device.; [place] of vascular access device: [facility]: 0.9% NaCl pre/post medica...    Question Answer Comment  Instructions May follow [facility] Protocol    Instructions May administer Cathflo as needed to maintain patency of vascular access device.   Instructions [place] of vascular access device: [facility]: 0.9% NaCl pre/post medication administration and prn patency; Heparin 100 u/ml, 24m30mor implanted ports and Heparin 10u/ml, 24ml66mr all other central venous catheters.   Instructions May follow [facility] Anaphylaxis Protocol for First Dose Administration in the home: 0.9% NaCl at 25-50 ml/hr to maintain IV access for protocol meds. Epinephrine 0.3 ml IV/IM PRN and Benadryl 25-50 IV/IM PRN s/s of anaphylaxis.   Instructions [facility] Infusion Coordinator (RN) to assist per patient IV care needs in the home PRN.      04/15/19 1453          Time coordinating discharge: 35 minutes  The results of significant diagnostics from this hospitalization (including imaging, microbiology, ancillary and laboratory) are listed below for reference.    Procedures and Diagnostic Studies:   Dg Toe Great Left  Result Date: 04/11/2019 CLINICAL DATA:  Pt has diabetic ulcer on right great toe. C/o having no feeling in any of his toes on either foot for quite some time. Family hx of diabetes. EXAM: LEFT GREAT TOE COMPARISON:  None. FINDINGS: Ulcerated lesion on the inferomedial aspect of the right great toe measuring approximately 1.8 cm. On the frontal view the deep aspect of the ulcer appears to abut the cortex of the distal phalanx. No definite bony destruction identified. No other acute finding in the visualized skeleton. IMPRESSION: Ulcerated lesion on the inferomedial aspect of the right great toe. On the frontal view the deep aspect of the ulcer appears to abut the cortex of the distal phalanx. No definite bony destruction. Electronically Signed   By: NancAudie Pinto.   On: 04/11/2019 15:30     Labs:   Basic Metabolic Panel: Recent Labs  Lab 04/11/19 1145 04/12/19 0929 04/13/19 0219 04/14/19 0620 04/15/19 0430  NA 139 135 139 139  140  K 3.9 3.5 3.8 4.0 3.7  CL 101 99 101 104 104  CO2 '28 25 27 27 26  ' GLUCOSE 294* 176* 199* 123* 165*  BUN <5* 5* 6 5* 7  CREATININE 0.73 0.61 0.64 0.70 0.98  CALCIUM 8.8* 8.7* 8.8* 9.0 8.9   GFR Estimated Creatinine Clearance: 126.9 mL/min (by C-G formula based on SCr of 0.98 mg/dL). Liver Function  Tests: Recent Labs  Lab 04/11/19 1145  AST 48*  ALT 33  ALKPHOS 71  BILITOT 0.4  PROT 6.9  ALBUMIN 3.5   No results for input(s): LIPASE, AMYLASE in the last 168 hours. No results for input(s): AMMONIA in the last 168 hours. Coagulation profile No results for input(s): INR, PROTIME in the last 168 hours.  CBC: Recent Labs  Lab 04/11/19 1145 04/12/19 0929 04/13/19 0219 04/14/19 0620 04/15/19 0430  WBC 9.3 7.9 6.1 6.9 7.5  NEUTROABS 4.9 5.2 3.2 3.3 4.6  HGB 16.5 16.3 16.3 17.1* 15.5  HCT 51.0 47.0 50.0 52.5* 47.9  MCV 93.8 89.5 92.3 92.3 93.7  PLT 235 203 200 222 195   Cardiac Enzymes: No results for input(s): CKTOTAL, CKMB, CKMBINDEX, TROPONINI in the last 168 hours. BNP: Invalid input(s): POCBNP CBG: Recent Labs  Lab 04/15/19 1152 04/15/19 1624 04/15/19 2102 04/16/19 0655 04/16/19 1148  GLUCAP 126* 114* 184* 148* 90   D-Dimer No results for input(s): DDIMER in the last 72 hours. Hgb A1c No results for input(s): HGBA1C in the last 72 hours. Lipid Profile No results for input(s): CHOL, HDL, LDLCALC, TRIG, CHOLHDL, LDLDIRECT in the last 72 hours. Thyroid function studies No results for input(s): TSH, T4TOTAL, T3FREE, THYROIDAB in the last 72 hours.  Invalid input(s): FREET3 Anemia work up No results for input(s): VITAMINB12, FOLATE, FERRITIN, TIBC, IRON, RETICCTPCT in the last 72 hours. Microbiology Recent Results (from the past 240 hour(s))  Culture, blood (routine x 2)     Status: None (Preliminary result)   Collection Time: 04/11/19  3:26 PM   Specimen: BLOOD RIGHT ARM  Result Value Ref Range Status   Specimen Description BLOOD RIGHT ARM  Final    Special Requests   Final    BOTTLES DRAWN AEROBIC AND ANAEROBIC Blood Culture adequate volume   Culture   Final    NO GROWTH 4 DAYS Performed at [facility], [address], [place], [place] [address]    Report Status PENDING  Incomplete  Culture, blood (routine x 2)     Status: None (Preliminary result)   Collection Time: 04/11/19  3:34 PM   Specimen: BLOOD LEFT ARM  Result Value Ref Range Status   Specimen Description BLOOD LEFT ARM  Final   Special Requests   Final    BOTTLES DRAWN AEROBIC AND ANAEROBIC Blood Culture adequate volume   Culture   Final    NO GROWTH 4 DAYS Performed at [facility], [place] [address] [place], [place] [address]    Report Status PENDING  Incomplete  SARS CORONAVIRUS 2 (TAT 6-24 HRS) Nasopharyngeal Nasopharyngeal Swab     Status: None   Collection Time: 04/11/19  3:45 PM   Specimen: Nasopharyngeal Swab  Result Value Ref Range Status   SARS Coronavirus 2 NEGATIVE NEGATIVE Final    Comment: (NOTE) SARS-CoV-2 target nucleic acids are NOT DETECTED. The SARS-CoV-2 RNA is generally detectable in upper and lower respiratory specimens during the acute phase of infection. Negative results do not preclude SARS-CoV-2 infection, do not rule out co-infections with other pathogens, and should not be used as the sole basis for treatment or other patient management decisions. Negative results must be combined with clinical observations, patient history, and epidemiological information. The expected result is Negative. Fact Sheet for Patients: SugarRoll.be Fact Sheet for Healthcare Providers: https://www.woods-mathews.com/ This test is not yet approved or cleared by the [place] FDA and  has been authorized for detection and/or diagnosis of SARS-CoV-2  by FDA under an Emergency Use Authorization (EUA). This EUA will remain  in effect (meaning this test can be used) for the duration of the COVID-19  declaration under Section 56 4(b)(1) of the Act, 21 U.S.C. section 360bbb-3(b)(1), unless the authorization is terminated or revoked sooner. Performed at [facility], [place] [address] [place], [place] [address]   Wound or Superficial Culture     Status: None   Collection Time: 04/11/19  3:47 PM   Specimen: Wound  Result Value Ref Range Status   Specimen Description WOUND RIGHT TOE  Final   Special Requests Immunocompromised  Final   Gram Stain   Final    RARE WBC PRESENT, PREDOMINANTLY PMN FEW GRAM POSITIVE COCCI RARE GRAM POSITIVE RODS    Culture   Final    NORMAL SKIN FLORA Performed at [facility], [address], [place], [place] [address]    Report Status 04/13/2019 FINAL  Final    Please note: You were cared for by a hospitalist during your hospital stay. Once you are discharged, your primary care physician will handle any further medical issues. Please note that NO REFILLS for any discharge medications will be authorized once you are discharged, as it is imperative that you return to your primary care physician (or establish a relationship with a primary care physician if you do not have one) for your post hospital discharge needs so that they can reassess your need for medications and monitor your lab values.  Signed: Terrilee Croak  [facility] 04/16/2019, 2:08 PM

## 2019-04-16 NOTE — Plan of Care (Signed)

## 2019-04-17 ENCOUNTER — Other Ambulatory Visit (HOSPITAL_COMMUNITY): Payer: Self-pay | Admitting: *Deleted

## 2019-04-17 DIAGNOSIS — E08621 Diabetes mellitus due to underlying condition with foot ulcer: Secondary | ICD-10-CM

## 2019-04-17 DIAGNOSIS — L97401 Non-pressure chronic ulcer of unspecified heel and midfoot limited to breakdown of skin: Secondary | ICD-10-CM

## 2019-04-17 LAB — GLUCOSE, CAPILLARY
Glucose-Capillary: 165 mg/dL — ABNORMAL HIGH (ref 70–99)
Glucose-Capillary: 70 mg/dL (ref 70–99)
Glucose-Capillary: 72 mg/dL (ref 70–99)

## 2019-04-17 LAB — CULTURE, BLOOD (ROUTINE X 2)
Culture: NO GROWTH
Culture: NO GROWTH
Special Requests: ADEQUATE
Special Requests: ADEQUATE

## 2019-04-17 MED ORDER — FREESTYLE SYSTEM KIT: 1.0000 | PACK | Freq: Three times a day (TID) | 1 refills | Status: AC

## 2019-04-17 MED ORDER — HEPARIN SOD (PORK) LOCK FLUSH 100 UNIT/ML IV SOLN
250.0000 [IU] | INTRAVENOUS | Status: DC | PRN
  Filled 2019-04-17: qty 3

## 2019-04-17 MED FILL — TRUEplus LANCETS 28G MISC: 25 days supply | Qty: 100 | Fill #0

## 2019-04-17 MED FILL — TRUE METRIX BLOOD GLUCOSE M: W/DEVICE | 1 days supply | Qty: 1 | Fill #0

## 2019-04-17 MED FILL — TRUE METRIX GLUCOSE TEST ST: 25 days supply | Qty: 100 | Fill #0

## 2019-04-17 NOTE — Progress Notes (Signed)
Chris Smith to be discharged home per MD order. Discussed prescriptions and follow up appointments with the patient. Prescriptions given to patient; medication list explained in detail. Patient verbalized understanding.  Skin clean, dry and intact without evidence of skin break down, no evidence of skin tears noted. IV catheter discontinued intact. Site without signs and symptoms of complications. Dressing and pressure applied. Pt denies pain at the site currently. No complaints noted.  Patient free of lines, drains, and wounds.   An After Visit Summary (AVS) was printed and given to the patient. Patient escorted via wheelchair, and discharged home via private auto.  Baldo Ash, RN

## 2019-04-17 NOTE — Plan of Care (Signed)
  Problem: Education: Goal: Knowledge of General Education information will improve Description: Including pain rating scale, medication(s)/side effects and non-pharmacologic comfort measures Outcome: Adequate for Discharge   Problem: Health Behavior/Discharge Planning: Goal: Ability to manage health-related needs will improve 04/17/2019 1139 by Chris Ash, RN Outcome: Adequate for Discharge 04/17/2019 1016 by Chris Ash, RN Outcome: Progressing   Problem: Clinical Measurements: Goal: Ability to maintain clinical measurements within normal limits will improve Outcome: Adequate for Discharge Goal: Will remain free from infection Outcome: Adequate for Discharge Goal: Diagnostic test results will improve Outcome: Adequate for Discharge Goal: Respiratory complications will improve Outcome: Adequate for Discharge Goal: Cardiovascular complication will be avoided Outcome: Adequate for Discharge   Problem: Activity: Goal: Risk for activity intolerance will decrease Outcome: Adequate for Discharge   Problem: Nutrition: Goal: Adequate nutrition will be maintained Outcome: Adequate for Discharge   Problem: Coping: Goal: Level of anxiety will decrease Outcome: Adequate for Discharge   Problem: Elimination: Goal: Will not experience complications related to bowel motility Outcome: Adequate for Discharge Goal: Will not experience complications related to urinary retention Outcome: Adequate for Discharge   Problem: Elimination: Goal: Will not experience complications related to urinary retention Outcome: Adequate for Discharge   Problem: Pain Managment: Goal: General experience of comfort will improve Outcome: Adequate for Discharge   Problem: Safety: Goal: Ability to remain free from injury will improve Outcome: Adequate for Discharge   Problem: Skin Integrity: Goal: Risk for impaired skin integrity will decrease Outcome: Adequate for Discharge

## 2019-04-17 NOTE — Discharge Summary (Signed)
Physician Discharge Summary  Rajendra Spiller QJF:[id number] DOB: Mar 17, 1976 DOA: 04/11/2019  PCP: Aretta Nip, MD  Admit date: 04/11/2019 Discharge date: 04/17/2019  Admitted From: Home Discharge disposition: Home with IV antibiotics   Code Status: Full Code  Diet Recommendation: Cardiac and diabetic diet   Recommendations for Outpatient Follow-Up:   1. Follow-up with outpatient antibiotic infusion center 2. Follow-up with PCP 3. Recommend to return to ED if wound is worsening or not responding to antibiotics.  Discharge Diagnosis: Yes 28 no 26-year 28 however waiting for family 43 year old   Active Problems:   Diabetic foot ulcer ([place])   Osteomyelitis of great toe of right foot ([place])   Diabetic polyneuropathy associated with type 2 diabetes mellitus ([place])  History of Present Illness / Brief narrative:  Rivaldo Hineman is a 43 y.o. male with PMH of type 2 diabetes mellitus for last 10 years, chronic back pain, hypertension, alcoholism, tobacco use.  Patient presented to the ED on 10/9 with complaint of nonhealing right foot ulcer. Last hospitalization 9/8-9/11 for sepsis secondary to right foot ulcer.  MRI of right foot on that admission showed, early osteomyelitis.  He was started on IV antibiotics.  Patient wanted to try nonoperative nature because he was afraid to lose his toe.  Long-term IV antibiotics was planned.  However patient signed out AMA on 9/11. Patient states right foot also has not improved and he presented to the ED on 10/9 for the same.  No fever. In the ED, patient was not febrile, blood pressure elevated up to 169/124. WBC count normal at 9.3, lactic acid normal at 1.7, renal function normal. Patient was admitted for management of diabetic foot ulcer.   Subjective:  Seen and examined this afternoon.  Walking on the hallway.  Not in distress.  Feels grateful for the care.  He states that he will be back soon to get the surgery done.  Hospital Course:    Nonhealing diabetic foot ulcer on the right Early osteomyelitis per MRI from a month ago -Not septic. -started on broad-spectrum antibiotics.  No growth on culture. -Seen by orthopedics Dr. Sharol Given.  Recommended surgical amputation of the great toe.  Patient would like to avoid surgery at this time and would like to try IV antibiotics.   -ID consultation was obtained. -PICC line obtained 10/12.  Per ID recommendation IV Invanz 1 g daily for 6 weeks as well as oral doxycycline. -Case management has set him up for daily antibiotic infusion at 'Short Stay'.  Type 2 diabetes mellitus for last 10 years. -A1c 8.8 on 9/8 -Takes Lantus at home but inconsistent doses because he does not have medical insurance and was rationing insulin. -Currently on Lantus 20 units nightly with sliding scale and Accu-Cheks.  I also added metformin 5 mg twice daily and glipizide 2.5 mg daily. -started the patient on Neurontin as well. -Lipid panel shows HDL 41, LDL 80  Hypertension -Blood pressure elevated up to 150s.. -Noticed that he is only on lisinopril 20 mg daily at home. -Added amlodipine 10 mg daily and increased the dose of lisinopril from 20 mg to 40 mg daily.  Chronic back pain -On ibuprofen at home. -Currently on Vicodin 5/325 1 tab q4hrs as needed.  Continue the same.  Alcohol abuse/tobacco abuse -Counseled to quit. -Not on withdrawal symptoms at this time.  No medical insurance -Medication assistance provided at discharge.  I told him the prescription for all blood pressure and diabetes medication including Lantus.  Filled for a  month supply at transitional care pharmacy.  Discharge Exam:   Vitals:   04/16/19 1617 04/16/19 1954 04/17/19 0359 04/17/19 0743  BP: (!) 143/98 (!) 145/92 (!) 145/90 (!) 157/105  Pulse: 88 78 68 78  Resp: '16 18 18 18  ' Temp: 98.7 F (37.1 C) 98.3 F (36.8 C) 99.4 F (37.4 C) 98.3 F (36.8 C)  TempSrc: Oral Oral Oral Oral  SpO2: 97% 99% 100% 99%  Weight:       Height:        Body mass index is 29.76 kg/m.  General exam: Appears calm and comfortable.  Skin: No rashes, lesions or ulcers. HEENT: Atraumatic, normocephalic, supple neck, no obvious bleeding Lungs: Clear to auscultation bilaterally CVS: Regular rate and rhythm, no murmur GI/Abd soft, nontender, nondistended, bowel sound present CNS: Alert, awake and oriented x3 Psychiatry: Mood appropriate Extremities: No pedal edema, no calf tenderness, right foot ulcer bandaged.  Discharge Instructions:  Wound care: Wound care instruction provided. Discharge Instructions    Face-to-face encounter (required for Medicare/Medicaid patients)   Complete by: As directed    I [name] certify that this patient is under my care and that I, or a nurse practitioner or physician's assistant working with me, had a face-to-face encounter that meets the physician face-to-face encounter requirements with this patient on 04/15/2019. The encounter with the patient was in whole, or in part for the following medical condition(s) which is the primary reason for home health care (List medical condition): Uncontrolled diabetes mellitus, diabetic foot ulcer, osteomyelitis.   The encounter with the patient was in whole, or in part, for the following medical condition, which is the primary reason for home health care: DM, diabetic foot ilcer, osteomyelitis   I certify that, based on my findings, the following services are medically necessary home health services: Nursing   Reason for Medically Necessary Home Health Services: Skilled Nursing- Change/Decline in Patient Status   My clinical findings support the need for the above services: Pain interferes with ambulation/mobility   Further, I certify that my clinical findings support that this patient is homebound due to: Pain interferes with ambulation/mobility   Home Health   Complete by: As directed    To provide the following care/treatments: RN   Home infusion  instructions [facility] May follow [place] Dosing Protocol; May administer Cathflo as needed to maintain patency of vascular access device.; [place] of vascular access device: per [facility] Protocol: 0.9% NaCl pre/post medica...   Complete by: As directed    Instructions: May follow [place] Dosing Protocol   Instructions: May administer Cathflo as needed to maintain patency of vascular access device.   Instructions: Flushing of vascular access device: per [facility] Protocol: 0.9% NaCl pre/post medication administration and prn patency; Heparin 100 u/ml, 23m for implanted ports and Heparin 10u/ml, 546mfor all other central venous catheters.   Instructions: May follow [facility] Anaphylaxis Protocol for First Dose Administration in the home: 0.9% NaCl at 25-50 ml/hr to maintain IV access for protocol meds. Epinephrine 0.3 ml IV/IM PRN and Benadryl 25-50 IV/IM PRN s/s of anaphylaxis.   Instructions: Ad[place]ottonwoodnfusion Coordinator (RN) to assist per patient IV care needs in the home PRN.   Home infusion instructions [facility] May follow [place] Protocol; May administer Cathflo as needed to maintain patency of vascular access device.; Flushing of vascular access device: [facility]: 0.9% NaCl pre/post medica...   Complete by: As directed    Instructions: May follow  [facility] Dosing Protocol   Instructions: May administer Cathflo as needed to maintain patency of vascular access device.   Instructions: [place]ushing of vascular access device: per [facility] Protocol: 0.9% NaCl pre/post medication administration and prn patency; Heparin 100 u/ml, 77m for implanted ports and Heparin 10u/ml, 529mfor all other central venous catheters.   Instructions: May follow [facility] Anaphylaxis Protocol for First Dose Administration in the home: 0.9% NaCl at 25-50 ml/hr to maintain IV access for protocol meds. Epinephrine 0.3 ml IV/IM PRN and Benadryl 25-50 IV/IM PRN s/s of anaphylaxis.   Instructions:  AdHudsonnfusion Coordinator (RN) to assist per patient IV care needs in the home PRN.   Increase activity slowly   Complete by: As directed      Follow-up Information    DuNewt MinionMD Follow up in 2 week(s).   Specialty: Orthopedic Surgery Contact information: [address] [phone]      [facility]. Go to.   Why: You will [facility] your [facility] @ [facility] and will check in daily at Admitting @ 7:45am. Weekend schedule to be arranged with staff at first dose.  Contact information: [phone]       Allergies as of 04/17/2019   No Known Allergies     Medication List    STOP taking these medications   ibuprofen 200 MG tablet Commonly known as: ADVIL   insulin aspart protamine- aspart (70-30) 100 UNIT/ML injection Commonly known as: NovoLOG Mix 70/30   insulin glargine 100 UNIT/ML injection Commonly known as: LANTUS Replaced by: Basaglar KwikPen 100 UNIT/ML Sopn   levofloxacin 750 MG tablet Commonly known as: LEVAQUIN     TAKE these medications   acetaminophen 325 MG tablet Commonly known as: TYLENOL Take 650 mg by mouth every 6 (six) hours as needed for moderate pain.   amLODipine 10 MG tablet Commonly known as: NORVASC Take 1 tablet (10 mg total) by mouth daily.   Basaglar KwikPen 100 UNIT/ML Sopn Inject 0.2 mLs (20 Units total) into the skin daily. Replaces: insulin glargine 100 UNIT/ML injection   doxycycline 100 MG tablet Commonly known as: VIBRA-TABS Take 1 tablet (100 mg total) by mouth every 12 (twelve) hours.   ertapenem  IVPB Commonly known as: INVANZ Inject 1 g into the vein daily. Indication:  Diabetic foot infection Last Day of Therapy:  05/26/2019 Labs - Once weekly:  CBC/D and BMP, Labs - Every other week:  ESR and CRP   gabapentin 100 MG capsule Commonly known as: NEURONTIN Take 1 capsule (100  mg total) by mouth 3 (three) times daily.   glipiZIDE 5 MG tablet Commonly known as: GLUCOTROL Take 0.5 tablets (2.5 mg total) by mouth daily before breakfast.   HYDROcodone-acetaminophen 5-325 MG tablet Commonly known as: NORCO/VICODIN Take 1 tablet by mouth every 6 (six) hours as needed for up to 5 days for moderate pain.   lisinopril 40 MG tablet Commonly known as: ZESTRIL Take 1 tablet (40 mg total) by mouth daily. What changed:   medication strength  how much to take   metFORMIN 500 MG tablet Commonly known as: GLUCOPHAGE Take 1 tablet (500 mg total) by mouth 2 (two) times daily with a meal.   MULTIVITAMIN ADULT PO Take 1 tablet by mouth daily.   Pen Needles 31G X 8 MM Misc Use as directed  Home Infusion Instuctions  (From admission, onward)         Start     Ordered   04/15/19 0000  Home infusion instructions [facility] May follow [place] Dosing Protocol; May administer Cathflo as needed to maintain patency of vascular access device.; Flushing of vascular access device: per [facility] Protocol: 0.9% NaCl pre/post medica...    Question Answer Comment  Instructions May follow [place] Dosing Protocol   Instructions May administer Cathflo as needed to maintain patency of vascular access device.   Instructions Flushing of vascular access device: per [facility] Protocol: 0.9% NaCl pre/post medication administration and prn patency; Heparin 100 u/ml, 78m for implanted ports and Heparin 10u/ml, 516mfor all other central venous catheters.   Instructions May follow [facility] Anaphylaxis Protocol for First Dose Administration in the home: 0.9% NaCl at 25-50 ml/hr to maintain IV access for protocol meds. Epinephrine 0.3 ml IV/IM PRN and Benadryl 25-50 IV/IM PRN s/s of anaphylaxis.   Instructions [facility] Infusion Coordinator (RN) to assist per patient IV care needs in the home PRN.      04/15/19 1436   04/15/19 0000  Home infusion instructions [facility] May follow [facility] Protocol; May administer Cathflo as needed to maintain patency of vascular access device.; [place] of vascular access device: per [facility]: 0.9% NaCl pre/post medica...    Question Answer Comment  Instructions May follow [place] Protocol   Instructions May administer Cathflo as needed to maintain patency of vascular access device.   Instructions [place] of vascular access device: per [facility]: 0.9% NaCl pre/post medication administration and prn patency; Heparin 100 u/ml, 51m71mor implanted ports and Heparin 10u/ml, 51ml44mr all other central venous catheters.   Instructions May follow [facility] Anaphylaxis Protocol for First Dose Administration in the home: 0.9% NaCl at 25-50 ml/hr to maintain IV access for protocol meds. Epinephrine 0.3 ml IV/IM PRN and Benadryl 25-50 IV/IM PRN s/s of anaphylaxis.   Instructions [facility] Infusion Coordinator (RN) to assist per patient IV care needs in the home PRN.      04/15/19 1453          Time coordinating discharge: 35 minutes  The results of significant diagnostics from this hospitalization (including imaging, microbiology, ancillary and laboratory) are listed below for reference.    Procedures and Diagnostic Studies:   Dg Toe Great Left  Result Date: 04/11/2019 CLINICAL DATA:  Pt has diabetic ulcer on right great toe. C/o having no feeling in any of his toes on either foot for quite some time. Family hx of diabetes. EXAM: LEFT GREAT TOE COMPARISON:  None. FINDINGS: Ulcerated lesion on the inferomedial aspect of the right great toe measuring approximately 1.8 cm. On the frontal view the deep aspect of the ulcer appears to abut the cortex of the distal phalanx. No definite bony destruction identified. No other acute finding in the visualized skeleton. IMPRESSION: Ulcerated lesion on the inferomedial aspect of the right great toe. On the frontal view the deep aspect of the ulcer appears to  abut the cortex of the distal phalanx. No definite bony destruction. Electronically Signed   By: NancAudie Pinto.   On: 04/11/2019 15:30     Labs:   Basic Metabolic Panel: Recent Labs  Lab 04/11/19 1145 04/12/19 0929 04/13/19 0219 04/14/19 0620 04/15/19 0430  NA 139 135 139 139 140  K 3.9 3.5 3.8 4.0 3.7  CL 101 99 101 104 104  CO2 '28 25 27 27 26  ' GLUCOSE 294* 176* 199* 123* 165*  BUN <5* 5* 6 5* 7  CREATININE 0.73 0.61 0.64 0.70 0.98  CALCIUM 8.8* 8.7* 8.8* 9.0 8.9   GFR Estimated Creatinine Clearance: 126.9 mL/min (by C-G formula based on SCr of 0.98 mg/dL). Liver Function Tests: Recent Labs  Lab 04/11/19 1145  AST 48*  ALT 33  ALKPHOS 71  BILITOT 0.4  PROT 6.9  ALBUMIN 3.5   No results for input(s): LIPASE, AMYLASE in the last 168 hours. No results for input(s): AMMONIA in the last 168 hours. Coagulation profile No results for input(s): INR, PROTIME in the last 168 hours.  CBC: Recent Labs  Lab 04/11/19 1145 04/12/19 0929 04/13/19 0219 04/14/19 0620 04/15/19 0430  WBC 9.3 7.9 6.1 6.9 7.5  NEUTROABS 4.9 5.2 3.2 3.3 4.6  HGB 16.5 16.3 16.3 17.1* 15.5  HCT 51.0 47.0 50.0 52.5* 47.9  MCV 93.8 89.5 92.3 92.3 93.7  PLT 235 203 200 222 195   Cardiac Enzymes: No results for input(s): CKTOTAL, CKMB, CKMBINDEX, TROPONINI in the last 168 hours. BNP: Invalid input(s): POCBNP CBG: Recent Labs  Lab 04/16/19 1642 04/16/19 1957 04/17/19 0634 04/17/19 1127 04/17/19 1130  GLUCAP 166* 167* 165* 70 72   D-Dimer No results for input(s): DDIMER in the last 72 hours. Hgb A1c No results for input(s): HGBA1C in the last 72 hours. Lipid Profile No results for input(s): CHOL, HDL, LDLCALC, TRIG, CHOLHDL, LDLDIRECT in the last 72 hours. Thyroid function studies No results for input(s): TSH, T4TOTAL, T3FREE, THYROIDAB in the last 72 hours.  Invalid input(s): FREET3 Anemia work up No results for input(s): VITAMINB12, FOLATE, FERRITIN, TIBC, IRON, RETICCTPCT  in the last 72 hours. Microbiology Recent Results (from the past 240 hour(s))  Culture, blood (routine x 2)     Status: None   Collection Time: 04/11/19  3:26 PM   Specimen: BLOOD RIGHT ARM  Result Value Ref Range Status   Specimen Description BLOOD RIGHT ARM  Final   Special Requests   Final    BOTTLES DRAWN AEROBIC AND ANAEROBIC Blood Culture adequate volume   Culture   Final    NO GROWTH 6 DAYS Performed at [facility], [address] [place], [place] [address]    Report Status 04/17/2019 FINAL  Final  Culture, blood (routine x 2)     Status: None   Collection Time: 04/11/19  3:34 PM   Specimen: BLOOD LEFT ARM  Result Value Ref Range Status   Specimen Description BLOOD LEFT ARM  Final   Special Requests   Final    BOTTLES DRAWN AEROBIC AND ANAEROBIC Blood Culture adequate volume   Culture   Final    NO GROWTH 6 DAYS Performed at [facility], [place] [address] [place], [place] [address]    Report Status 04/17/2019 FINAL  Final  SARS CORONAVIRUS 2 (TAT 6-24 HRS) Nasopharyngeal Nasopharyngeal Swab     Status: None   Collection Time: 04/11/19  3:45 PM   Specimen: Nasopharyngeal Swab  Result Value Ref Range Status   SARS Coronavirus 2 NEGATIVE NEGATIVE Final    Comment: (NOTE) SARS-CoV-2 target nucleic acids are NOT DETECTED. The SARS-CoV-2 RNA is generally detectable in upper and lower respiratory specimens during the acute phase of infection. Negative results do not preclude SARS-CoV-2 infection, do not rule out co-infections with other pathogens, and should not be used as the sole basis for treatment or other patient management decisions. Negative results  must be combined with clinical observations, patient history, and epidemiological information. The expected result is Negative. Fact Sheet for Patients: SugarRoll.be Fact Sheet for Healthcare Providers: https://www.woods-mathews.com/ This test is not yet approved or  cleared by the [place] FDA and  has been authorized for detection and/or diagnosis of SARS-CoV-2 by FDA under an Emergency Use Authorization (EUA). This EUA will remain  in effect (meaning this test can be used) for the duration of the COVID-19 declaration under Section 56 4(b)(1) of the Act, 21 U.S.C. section 360bbb-3(b)(1), unless the authorization is terminated or revoked sooner. Performed at [facility], [address] [address] [place], [place] [address]   Wound or Superficial Culture     Status: None   Collection Time: 04/11/19  3:47 PM   Specimen: Wound  Result Value Ref Range Status   Specimen Description WOUND RIGHT TOE  Final   Special Requests Immunocompromised  Final   Gram Stain   Final    RARE WBC PRESENT, PREDOMINANTLY PMN FEW GRAM POSITIVE COCCI RARE GRAM POSITIVE RODS    Culture   Final    NORMAL SKIN FLORA Performed at [facility], [address], [place], [place] [address]    Report Status 04/13/2019 FINAL  Final    Please note: You were cared for by a hospitalist during your hospital stay. Once you are discharged, your primary care physician will handle any further medical issues. Please note that NO REFILLS for any discharge medications will be authorized once you are discharged, as it is imperative that you return to your primary care physician (or establish a relationship with a primary care physician if you do not have one) for your post hospital discharge needs so that they can reassess your need for medications and monitor your lab values.  Signed: Terrilee Croak  [facility] 04/17/2019, 11:42 AM

## 2019-04-17 NOTE — TOC Transition Note (Addendum)
Transition of Care [facility]) - CM/SW Discharge Note   Patient Details  Name: Chris Smith MRN: [id number] Date of Birth: 1976-04-22  [facility] CM/SW Contact:  Benard Halsted, LCSW Phone Number: 04/17/2019, 11:09 AM   Clinical Narrative:    Per TOC Supervisor, Denyse Amass, Patient will receive infusion M-F @ 8am at [facility] tower entrance) and will check in daily at admitting @ 7:45am. Short Stay nursing staff will discuss weekend schedule with patient with his infusion in the morning. Patient will be instructed at that time to check in through the ED on Saturday and Sunday and receive infusion through the IV team between 9:30am and 11am. No other concerns at this time. Patient will discharge home today after IV infusion. MD and patient aware.   Patient requesting a glucose meter. CSW requesting TOC pharmacy to process and deliver it the room.   Final next level of care: Home/Self Care Barriers to Discharge: No Barriers Identified   Patient Goals and CMS Choice Patient states their goals for this hospitalization and ongoing recovery are:: Return home to get his affairs in order CMS Medicare.gov Compare Post Acute Care list provided to:: Patient Choice offered to / list presented to : Patient  Discharge Placement                       Discharge Plan and Services In-house Referral: NA Discharge Planning Services: CM Consult Post Acute Care Choice: Durable Medical Equipment, Home Health          DME Arranged: IV pump/equipment DME Agency: Other - Comment([employer]) Date DME Agency Contacted: 04/14/19 Time DME Agency Contacted: [phone] Representative spoke with at DME Agency: [place]: RN [place] Agency: [facility] [employer]) Date [place]: 04/15/19 Time [place]: [phone] Representative spoke with at [place]: [place] ([place]) Interventions     Readmission Risk Interventions No  flowsheet data found.

## 2019-04-17 NOTE — Clinical Social Work Note (Signed)
Transitions of Care [facility] Supervisor spoke with Dr. Hulen Skains regarding barriers for patient to receive home infusion due to no payor source.  Dr. Hulen Skains connected with Evonnie Pat from [place] who was able to make arrangements for patient to receive daily infusion through short stay.  Patient will receive infusion M-F @ 8am and will check in daily at admitting @ 7:45am. Short Stay nursing staff will discuss weekend schedule with patient with his infusion in the morning.  Patient will check in through the ED, Saturday and Sunday and receive infusion through the IV team.  [facility] staff has been updated and will communicate with patient regarding plans at discharge.    Barbette Or, [place]

## 2019-04-17 NOTE — Plan of Care (Signed)
  Problem: Health Behavior/Discharge Planning: Goal: Ability to manage health-related needs will improve Outcome: Progressing   

## 2019-04-18 ENCOUNTER — Inpatient Hospital Stay (HOSPITAL_COMMUNITY)
Admit: 2019-04-18 | Discharge: 2019-04-18 | Disposition: A | Discharge status: Home / Self Care | Payer: Self-pay | Attending: Internal Medicine | Admitting: Internal Medicine

## 2019-04-18 NOTE — Progress Notes (Signed)
10/16: CSW received call from [facility] with short stay stating that patient did not show up for his IV appointment. CSW contacted patient. He reported that he is having car trouble and could not make it but can get there tomorrow. CSW explained that he would not be able to go over the weekend due to needing to be set up with IV team first. CSW confirmed with Laverne that patient can come on Monday at 10am. Patient stated understanding. CSW made ID team aware.   Percell Locus Massiah Minjares LCSW [phone]

## 2019-04-19 ENCOUNTER — Encounter (HOSPITAL_COMMUNITY): Payer: Self-pay

## 2019-04-20 ENCOUNTER — Encounter (HOSPITAL_COMMUNITY): Payer: Self-pay

## 2019-04-21 ENCOUNTER — Ambulatory Visit (HOSPITAL_COMMUNITY)
Admission: RE | Admit: 2019-04-21 | Discharge: 2019-04-21 | Disposition: A | Discharge status: Home / Self Care | Payer: Self-pay | Source: Ambulatory Visit | Attending: Internal Medicine | Admitting: Internal Medicine

## 2019-04-21 ENCOUNTER — Other Ambulatory Visit: Payer: Self-pay

## 2019-04-21 DIAGNOSIS — E1169 Type 2 diabetes mellitus with other specified complication: Secondary | ICD-10-CM | POA: Insufficient documentation

## 2019-04-21 DIAGNOSIS — L089 Local infection of the skin and subcutaneous tissue, unspecified: Secondary | ICD-10-CM | POA: Insufficient documentation

## 2019-04-21 LAB — CBC WITH DIFFERENTIAL/PLATELET
Abs Immature Granulocytes: 0.02 10*3/uL (ref 0.00–0.07)
Basophils Absolute: 0 10*3/uL (ref 0.0–0.1)
Basophils Relative: 1 %
Eosinophils Absolute: 0.1 10*3/uL (ref 0.0–0.5)
Eosinophils Relative: 2 %
HCT: 49.5 % (ref 39.0–52.0)
Hemoglobin: 16.2 g/dL (ref 13.0–17.0)
Immature Granulocytes: 0 %
Lymphocytes Relative: 28 %
Lymphs Abs: 1.9 10*3/uL (ref 0.7–4.0)
MCH: 29.7 pg (ref 26.0–34.0)
MCHC: 32.7 g/dL (ref 30.0–36.0)
MCV: 90.8 fL (ref 80.0–100.0)
Monocytes Absolute: 0.7 10*3/uL (ref 0.1–1.0)
Monocytes Relative: 10 %
Neutro Abs: 4 10*3/uL (ref 1.7–7.7)
Neutrophils Relative %: 59 %
Platelets: 352 10*3/uL (ref 150–400)
RBC: 5.45 MIL/uL (ref 4.22–5.81)
RDW: 12.7 % (ref 11.5–15.5)
WBC: 6.8 10*3/uL (ref 4.0–10.5)
nRBC: 0 % (ref 0.0–0.2)

## 2019-04-21 LAB — BASIC METABOLIC PANEL
Anion gap: 12 (ref 5–15)
BUN: <5 mg/dL — ABNORMAL LOW (ref 6–20)
CO2: 25 mmol/L (ref 22–32)
Calcium: 9.2 mg/dL (ref 8.9–10.3)
Chloride: 104 mmol/L (ref 98–111)
Creatinine, Ser: 0.64 mg/dL (ref 0.61–1.24)
GFR calc Af Amer: >60 mL/min (ref >60)
GFR calc non Af Amer: >60 mL/min (ref >60)
Glucose, Bld: 123 mg/dL — ABNORMAL HIGH (ref 70–99)
Potassium: 3.3 mmol/L — ABNORMAL LOW (ref 3.5–5.1)
Sodium: 141 mmol/L (ref 135–145)

## 2019-04-21 MED ORDER — SODIUM CHLORIDE 0.9 % IV SOLN
1.0000 g | INTRAVENOUS | Status: DC
  Administered 2019-04-21: 1000 mg via INTRAVENOUS
  Filled 2019-04-21: qty 1

## 2019-04-21 MED ORDER — HEPARIN SOD (PORK) LOCK FLUSH 100 UNIT/ML IV SOLN
INTRAVENOUS | Status: AC
  Administered 2019-04-21: 500 [IU] via INTRAVENOUS
  Filled 2019-04-21: qty 5

## 2019-04-22 ENCOUNTER — Other Ambulatory Visit: Payer: Self-pay

## 2019-04-22 ENCOUNTER — Encounter (HOSPITAL_COMMUNITY)
Admission: RE | Admit: 2019-04-22 | Discharge: 2019-04-22 | Disposition: A | Discharge status: Home / Self Care | Payer: Self-pay | Source: Ambulatory Visit | Attending: Internal Medicine | Admitting: Internal Medicine

## 2019-04-22 DIAGNOSIS — M869 Osteomyelitis, unspecified: Secondary | ICD-10-CM | POA: Insufficient documentation

## 2019-04-22 DIAGNOSIS — E11621 Type 2 diabetes mellitus with foot ulcer: Secondary | ICD-10-CM | POA: Insufficient documentation

## 2019-04-22 MED ORDER — SODIUM CHLORIDE 0.9 % IV SOLN
1.0000 g | INTRAVENOUS | Status: DC
  Administered 2019-04-22: 1000 mg via INTRAVENOUS
  Filled 2019-04-22: qty 1

## 2019-04-22 MED ORDER — HEPARIN SOD (PORK) LOCK FLUSH 100 UNIT/ML IV SOLN: 250.0000 [IU] | Freq: Every day | INTRAVENOUS | Status: DC

## 2019-04-22 MED ORDER — HEPARIN SOD (PORK) LOCK FLUSH 100 UNIT/ML IV SOLN
250.0000 [IU] | INTRAVENOUS | Status: DC | PRN
  Administered 2019-04-22: 250 [IU]

## 2019-04-22 MED ORDER — HEPARIN SOD (PORK) LOCK FLUSH 100 UNIT/ML IV SOLN
INTRAVENOUS | Status: AC
  Filled 2019-04-22: qty 5

## 2019-04-23 ENCOUNTER — Other Ambulatory Visit: Payer: Self-pay

## 2019-04-23 ENCOUNTER — Ambulatory Visit (HOSPITAL_COMMUNITY)
Admission: RE | Admit: 2019-04-23 | Discharge: 2019-04-23 | Disposition: A | Discharge status: Home / Self Care | Payer: Self-pay | Source: Ambulatory Visit | Attending: Internal Medicine | Admitting: Internal Medicine

## 2019-04-23 DIAGNOSIS — E11621 Type 2 diabetes mellitus with foot ulcer: Secondary | ICD-10-CM | POA: Insufficient documentation

## 2019-04-23 DIAGNOSIS — M868X8 Other osteomyelitis, other site: Secondary | ICD-10-CM | POA: Insufficient documentation

## 2019-04-23 MED ORDER — SODIUM CHLORIDE 0.9 % IV SOLN
1.0000 g | INTRAVENOUS | Status: DC
  Administered 2019-04-23: 09:00:00 1000 mg via INTRAVENOUS
  Filled 2019-04-23: qty 1

## 2019-04-23 MED ORDER — HEPARIN SOD (PORK) LOCK FLUSH 100 UNIT/ML IV SOLN: 250.0000 [IU] | INTRAVENOUS | Status: DC | PRN

## 2019-04-23 MED ORDER — HEPARIN SOD (PORK) LOCK FLUSH 100 UNIT/ML IV SOLN
INTRAVENOUS | Status: AC
  Filled 2019-04-23: qty 5

## 2019-04-23 MED ORDER — HEPARIN SOD (PORK) LOCK FLUSH 100 UNIT/ML IV SOLN
250.0000 [IU] | Freq: Every day | INTRAVENOUS | Status: DC
  Administered 2019-04-23: 250 [IU]

## 2019-04-24 ENCOUNTER — Ambulatory Visit (HOSPITAL_COMMUNITY)
Admission: RE | Admit: 2019-04-24 | Discharge: 2019-04-24 | Disposition: A | Discharge status: Home / Self Care | Payer: Self-pay | Source: Ambulatory Visit | Attending: Internal Medicine | Admitting: Internal Medicine

## 2019-04-24 ENCOUNTER — Other Ambulatory Visit: Payer: Self-pay

## 2019-04-24 DIAGNOSIS — E11621 Type 2 diabetes mellitus with foot ulcer: Secondary | ICD-10-CM | POA: Insufficient documentation

## 2019-04-24 DIAGNOSIS — M869 Osteomyelitis, unspecified: Secondary | ICD-10-CM | POA: Insufficient documentation

## 2019-04-24 MED ORDER — SODIUM CHLORIDE 0.9 % IV SOLN
1.0000 g | INTRAVENOUS | Status: DC
  Administered 2019-04-24: 1000 mg via INTRAVENOUS
  Filled 2019-04-24: qty 1

## 2019-04-24 MED ORDER — HEPARIN SOD (PORK) LOCK FLUSH 100 UNIT/ML IV SOLN
INTRAVENOUS | Status: AC
  Filled 2019-04-24: qty 5

## 2019-04-24 MED ORDER — HEPARIN SOD (PORK) LOCK FLUSH 100 UNIT/ML IV SOLN: 250.0000 [IU] | Freq: Every day | INTRAVENOUS | Status: DC

## 2019-04-24 MED ORDER — HEPARIN SOD (PORK) LOCK FLUSH 100 UNIT/ML IV SOLN: 250.0000 [IU] | INTRAVENOUS | Status: DC | PRN

## 2019-04-25 ENCOUNTER — Other Ambulatory Visit: Payer: Self-pay | Admitting: *Deleted

## 2019-04-25 ENCOUNTER — Other Ambulatory Visit: Payer: Self-pay

## 2019-04-25 ENCOUNTER — Ambulatory Visit (HOSPITAL_COMMUNITY)
Admission: RE | Admit: 2019-04-25 | Discharge: 2019-04-25 | Disposition: A | Discharge status: Home / Self Care | Payer: Self-pay | Source: Ambulatory Visit | Attending: Internal Medicine | Admitting: Internal Medicine

## 2019-04-25 DIAGNOSIS — E1169 Type 2 diabetes mellitus with other specified complication: Secondary | ICD-10-CM | POA: Insufficient documentation

## 2019-04-25 DIAGNOSIS — M869 Osteomyelitis, unspecified: Secondary | ICD-10-CM | POA: Insufficient documentation

## 2019-04-25 DIAGNOSIS — E11621 Type 2 diabetes mellitus with foot ulcer: Secondary | ICD-10-CM | POA: Insufficient documentation

## 2019-04-25 DIAGNOSIS — L97509 Non-pressure chronic ulcer of other part of unspecified foot with unspecified severity: Secondary | ICD-10-CM | POA: Insufficient documentation

## 2019-04-25 DIAGNOSIS — R197 Diarrhea, unspecified: Secondary | ICD-10-CM

## 2019-04-25 MED ORDER — HEPARIN SOD (PORK) LOCK FLUSH 100 UNIT/ML IV SOLN
250.0000 [IU] | Freq: Every day | INTRAVENOUS | Status: DC
  Administered 2019-04-25: 250 [IU]

## 2019-04-25 MED ORDER — HEPARIN SOD (PORK) LOCK FLUSH 100 UNIT/ML IV SOLN
INTRAVENOUS | Status: AC
  Filled 2019-04-25: qty 5

## 2019-04-25 MED ORDER — SODIUM CHLORIDE 0.9 % IV SOLN
1.0000 g | INTRAVENOUS | Status: DC
  Administered 2019-04-25: 1000 mg via INTRAVENOUS
  Filled 2019-04-25: qty 1

## 2019-04-25 MED ORDER — ONDANSETRON 4 MG PO TBDP: 4.0000 mg | ORAL_TABLET | Freq: Three times a day (TID) | ORAL | 0 refills | Status: DC | PRN

## 2019-04-25 MED ORDER — HEPARIN SOD (PORK) LOCK FLUSH 100 UNIT/ML IV SOLN: 250.0000 [IU] | INTRAVENOUS | Status: DC | PRN

## 2019-04-25 NOTE — Progress Notes (Signed)
Pt complaining of nausea and diarrhea. States has not thrown up this morning but was unable to work the past 2 days because of hourly+ diarrhea and emesis. States has not been able to eat and keep anything down. Spoke with Dr Cynthia Snyder and have instructed pt to stop by their clinic when he leaves here and "pick up some medicine and a stool kit". Pt states understanding and compliance. 

## 2019-04-26 ENCOUNTER — Encounter (HOSPITAL_COMMUNITY)
Admission: RE | Admit: 2019-04-26 | Discharge: 2019-04-26 | Disposition: A | Discharge status: Home / Self Care | Payer: Self-pay | Source: Ambulatory Visit | Attending: Internal Medicine | Admitting: Internal Medicine

## 2019-04-26 ENCOUNTER — Other Ambulatory Visit: Payer: Self-pay

## 2019-04-26 MED ORDER — HEPARIN SOD (PORK) LOCK FLUSH 100 UNIT/ML IV SOLN: 250.0000 [IU] | INTRAVENOUS | Status: DC | PRN

## 2019-04-26 MED ORDER — SODIUM CHLORIDE 0.9 % IV SOLN
1.0000 g | INTRAVENOUS | Status: DC
  Administered 2019-04-26: 1000 mg via INTRAVENOUS
  Filled 2019-04-26: qty 1

## 2019-04-26 MED ORDER — HEPARIN SOD (PORK) LOCK FLUSH 100 UNIT/ML IV SOLN
250.0000 [IU] | Freq: Every day | INTRAVENOUS | Status: DC
  Administered 2019-04-26: 10:00:00 250 [IU]

## 2019-04-27 ENCOUNTER — Encounter (HOSPITAL_COMMUNITY)
Admission: RE | Admit: 2019-04-27 | Discharge: 2019-04-27 | Disposition: A | Discharge status: Home / Self Care | Payer: Self-pay | Source: Ambulatory Visit | Attending: Internal Medicine | Admitting: Internal Medicine

## 2019-04-27 MED ORDER — HEPARIN SOD (PORK) LOCK FLUSH 100 UNIT/ML IV SOLN: 250.0000 [IU] | INTRAVENOUS | Status: DC | PRN

## 2019-04-27 MED ORDER — HEPARIN SOD (PORK) LOCK FLUSH 100 UNIT/ML IV SOLN
250.0000 [IU] | Freq: Every day | INTRAVENOUS | Status: DC
  Administered 2019-04-27: 250 [IU]

## 2019-04-27 MED ORDER — SODIUM CHLORIDE 0.9 % IV SOLN
1.0000 g | INTRAVENOUS | Status: DC
  Administered 2019-04-27: 1000 mg via INTRAVENOUS
  Filled 2019-04-27: qty 1

## 2019-04-28 ENCOUNTER — Other Ambulatory Visit (HOSPITAL_COMMUNITY): Payer: Self-pay | Admitting: *Deleted

## 2019-04-28 ENCOUNTER — Inpatient Hospital Stay (HOSPITAL_COMMUNITY)
Admission: RE | Admit: 2019-04-28 | Discharge: 2019-04-28 | Disposition: A | Discharge status: Home / Self Care | Payer: Self-pay | Source: Ambulatory Visit | Attending: Internal Medicine | Admitting: Internal Medicine

## 2019-04-29 ENCOUNTER — Ambulatory Visit (HOSPITAL_COMMUNITY)
Admission: RE | Admit: 2019-04-29 | Discharge: 2019-04-29 | Disposition: A | Discharge status: Home / Self Care | Payer: Self-pay | Source: Ambulatory Visit | Attending: Internal Medicine | Admitting: Internal Medicine

## 2019-04-29 ENCOUNTER — Other Ambulatory Visit: Payer: Self-pay

## 2019-04-29 DIAGNOSIS — E11621 Type 2 diabetes mellitus with foot ulcer: Secondary | ICD-10-CM | POA: Insufficient documentation

## 2019-04-29 DIAGNOSIS — M869 Osteomyelitis, unspecified: Secondary | ICD-10-CM | POA: Insufficient documentation

## 2019-04-29 LAB — CBC WITH DIFFERENTIAL/PLATELET
Abs Immature Granulocytes: 0.03 10*3/uL (ref 0.00–0.07)
Basophils Absolute: 0 10*3/uL (ref 0.0–0.1)
Basophils Relative: 1 %
Eosinophils Absolute: 0.1 10*3/uL (ref 0.0–0.5)
Eosinophils Relative: 2 %
HCT: 48.1 % (ref 39.0–52.0)
Hemoglobin: 16 g/dL (ref 13.0–17.0)
Immature Granulocytes: 1 %
Lymphocytes Relative: 31 %
Lymphs Abs: 2.1 10*3/uL (ref 0.7–4.0)
MCH: 29.8 pg (ref 26.0–34.0)
MCHC: 33.3 g/dL (ref 30.0–36.0)
MCV: 89.6 fL (ref 80.0–100.0)
Monocytes Absolute: 0.6 10*3/uL (ref 0.1–1.0)
Monocytes Relative: 10 %
Neutro Abs: 3.7 10*3/uL (ref 1.7–7.7)
Neutrophils Relative %: 55 %
Platelets: 284 10*3/uL (ref 150–400)
RBC: 5.37 MIL/uL (ref 4.22–5.81)
RDW: 12.5 % (ref 11.5–15.5)
WBC: 6.6 10*3/uL (ref 4.0–10.5)
nRBC: 0 % (ref 0.0–0.2)

## 2019-04-29 LAB — BASIC METABOLIC PANEL
Anion gap: 13 (ref 5–15)
BUN: <5 mg/dL — ABNORMAL LOW (ref 6–20)
CO2: 25 mmol/L (ref 22–32)
Calcium: 9.1 mg/dL (ref 8.9–10.3)
Chloride: 102 mmol/L (ref 98–111)
Creatinine, Ser: 0.61 mg/dL (ref 0.61–1.24)
GFR calc Af Amer: >60 mL/min (ref >60)
GFR calc non Af Amer: >60 mL/min (ref >60)
Glucose, Bld: 147 mg/dL — ABNORMAL HIGH (ref 70–99)
Potassium: 3.6 mmol/L (ref 3.5–5.1)
Sodium: 140 mmol/L (ref 135–145)

## 2019-04-29 LAB — SEDIMENTATION RATE: Sed Rate: 7 mm/hr (ref 0–16)

## 2019-04-29 LAB — C-REACTIVE PROTEIN: CRP: <0.8 mg/dL (ref <1.0)

## 2019-04-29 MED ORDER — HEPARIN SOD (PORK) LOCK FLUSH 100 UNIT/ML IV SOLN
INTRAVENOUS | Status: AC
  Filled 2019-04-29: qty 5

## 2019-04-29 MED ORDER — HEPARIN SOD (PORK) LOCK FLUSH 100 UNIT/ML IV SOLN
500.0000 [IU] | Freq: Once | INTRAVENOUS | Status: AC
  Administered 2019-04-29: 500 [IU] via INTRAVENOUS

## 2019-04-29 MED ORDER — SODIUM CHLORIDE 0.9 % IV SOLN
1.0000 g | Freq: Once | INTRAVENOUS | Status: AC
  Administered 2019-04-29: 1000 mg via INTRAVENOUS
  Filled 2019-04-29: qty 1

## 2019-04-30 ENCOUNTER — Ambulatory Visit (HOSPITAL_COMMUNITY)
Admission: RE | Admit: 2019-04-30 | Discharge: 2019-04-30 | Disposition: A | Discharge status: Home / Self Care | Payer: Self-pay | Source: Ambulatory Visit | Attending: Internal Medicine | Admitting: Internal Medicine

## 2019-04-30 ENCOUNTER — Encounter (HOSPITAL_COMMUNITY): Payer: Self-pay

## 2019-04-30 ENCOUNTER — Other Ambulatory Visit: Payer: Self-pay

## 2019-04-30 DIAGNOSIS — E11621 Type 2 diabetes mellitus with foot ulcer: Secondary | ICD-10-CM | POA: Insufficient documentation

## 2019-04-30 DIAGNOSIS — M868X7 Other osteomyelitis, ankle and foot: Secondary | ICD-10-CM | POA: Insufficient documentation

## 2019-04-30 MED ORDER — HEPARIN SOD (PORK) LOCK FLUSH 100 UNIT/ML IV SOLN
INTRAVENOUS | Status: AC
  Administered 2019-04-30: 250 [IU] via INTRAVENOUS
  Filled 2019-04-30: qty 5

## 2019-04-30 MED ORDER — HEPARIN SOD (PORK) LOCK FLUSH 100 UNIT/ML IV SOLN: 500.0000 [IU] | Freq: Once | INTRAVENOUS | Status: DC

## 2019-04-30 MED ORDER — SODIUM CHLORIDE 0.9 % IV SOLN
1.0000 g | Freq: Once | INTRAVENOUS | Status: DC
  Administered 2019-04-30: 10:00:00 1000 mg via INTRAVENOUS
  Filled 2019-04-30: qty 1

## 2019-05-01 ENCOUNTER — Telehealth: Payer: Self-pay

## 2019-05-01 ENCOUNTER — Other Ambulatory Visit: Payer: Self-pay

## 2019-05-01 ENCOUNTER — Ambulatory Visit (HOSPITAL_COMMUNITY)
Admission: RE | Admit: 2019-05-01 | Discharge: 2019-05-01 | Disposition: A | Discharge status: Home / Self Care | Payer: Self-pay | Source: Ambulatory Visit | Attending: Internal Medicine | Admitting: Internal Medicine

## 2019-05-01 ENCOUNTER — Telehealth: Payer: Self-pay | Admitting: *Deleted

## 2019-05-01 DIAGNOSIS — E11621 Type 2 diabetes mellitus with foot ulcer: Secondary | ICD-10-CM | POA: Insufficient documentation

## 2019-05-01 DIAGNOSIS — M869 Osteomyelitis, unspecified: Secondary | ICD-10-CM | POA: Insufficient documentation

## 2019-05-01 MED ORDER — SODIUM CHLORIDE 0.9 % IV SOLN
1.0000 g | Freq: Once | INTRAVENOUS | Status: AC
  Administered 2019-05-01: 1000 mg via INTRAVENOUS
  Filled 2019-05-01: qty 1

## 2019-05-01 MED ORDER — HEPARIN SOD (PORK) LOCK FLUSH 100 UNIT/ML IV SOLN
INTRAVENOUS | Status: AC
  Administered 2019-05-01: 500 [IU] via INTRAVENOUS
  Filled 2019-05-01: qty 5

## 2019-05-01 MED ORDER — HEPARIN SOD (PORK) LOCK FLUSH 100 UNIT/ML IV SOLN
500.0000 [IU] | Freq: Once | INTRAVENOUS | Status: AC
  Administered 2019-05-01: 11:00:00 500 [IU] via INTRAVENOUS

## 2019-05-01 NOTE — Progress Notes (Signed)
Pt requesting a note to go back to work tonight. States the blood in his urine is better.  I explained to pt that I was not authorized to write a note for him to go back to work and that his primary Dr would have to do that.  Pt explained that he did not have a primary. Infectious disease notified of pt's request.

## 2019-05-01 NOTE — Telephone Encounter (Signed)
Error

## 2019-05-01 NOTE — Telephone Encounter (Signed)
Patient advised or Dr Tommy Medal response and is very upset he can not get a note without a visit.

## 2019-05-01 NOTE — Telephone Encounter (Signed)
Lattie Haw, Rn from [facility] called office today on patient's behalf for a letter to return to work. States that patient does not have a PCP to write letter to return to work. Rn states patient was written our of work due to blood in urine, patient reports that this has cleared. Will forward message to MD to advise on letter. Patient is scheduled to see MD on 11/18. Har[facility] [place], [place]

## 2019-05-01 NOTE — Telephone Encounter (Signed)
I dont think this is fair request since I have not seen the patient since he was hospitalized. We could try to work him in next week and see him. There may be other issues reasons why he should or should not return to work. If I see him in person next week and I think he could go back but I cant do so without seeing thispt

## 2019-05-02 ENCOUNTER — Inpatient Hospital Stay (HOSPITAL_COMMUNITY): Admission: RE | Admit: 2019-05-02 | Payer: Self-pay | Source: Ambulatory Visit

## 2019-05-02 ENCOUNTER — Ambulatory Visit: Payer: Self-pay | Admitting: Infectious Diseases

## 2019-05-03 ENCOUNTER — Encounter (HOSPITAL_COMMUNITY): Payer: Self-pay

## 2019-05-04 ENCOUNTER — Encounter (HOSPITAL_COMMUNITY): Payer: Self-pay

## 2019-05-05 ENCOUNTER — Telehealth: Payer: Self-pay

## 2019-05-05 ENCOUNTER — Inpatient Hospital Stay (HOSPITAL_COMMUNITY)
Admission: RE | Admit: 2019-05-05 | Payer: Self-pay | Source: Ambulatory Visit | Attending: Internal Medicine | Admitting: Internal Medicine

## 2019-05-05 NOTE — Telephone Encounter (Signed)
Received call from [place] with [facility] and reports patient has not been coming to daily IV infusions. Per Lavern she is removing him from the schedule due to inconsistence. Spoke with patient and he states that he has had car issues and  Has not been able to make it. Routing to Dr. Lucianne Lei for advise.  Eugenia Mcalpine

## 2019-05-05 NOTE — Telephone Encounter (Signed)
Thanks Shaquenia!

## 2019-05-05 NOTE — Telephone Encounter (Signed)
Attempted to call patient no answer. LPN will call IR to have patient scheduled to picc removal.  Chris Smith

## 2019-05-05 NOTE — Telephone Encounter (Signed)
Patient advised of Dr. Lucianne Lei Dam's recommendations to remove picc and take Augmentin BID along with Doxycycline. Patient was hesitate to accept the fact that his picc line is being discontinued. LPN reminded patient of recently missed IV infusions with short stay and advised his patient that this would benefit him at this time since he is unable to keep his appointments. At that point patient asked for Dr. Tommy Medal to give him a call.  Routing to provider to make aware. Eugenia Mcalpine

## 2019-05-05 NOTE — Telephone Encounter (Signed)
LPN contacted [facility] and notified Caryl Pina that patient will need to be schedule once Dr. Tommy Medal places orders. Patient currently has appointment on 05/12/19 arrival by 11:45am for 12pm appointment.  Eugenia Mcalpine

## 2019-05-05 NOTE — Telephone Encounter (Signed)
He should have pICC discontinued. He can start augmentin BID to take alongside his doxyxycline  He is not helping efforts to avoid limb amputation

## 2019-05-06 ENCOUNTER — Other Ambulatory Visit (HOSPITAL_COMMUNITY): Payer: Self-pay

## 2019-05-06 ENCOUNTER — Encounter (HOSPITAL_COMMUNITY): Payer: Self-pay

## 2019-05-06 ENCOUNTER — Other Ambulatory Visit: Payer: Self-pay | Admitting: Infectious Disease

## 2019-05-06 ENCOUNTER — Other Ambulatory Visit (HOSPITAL_COMMUNITY): Payer: Self-pay | Admitting: Infectious Disease

## 2019-05-06 DIAGNOSIS — M869 Osteomyelitis, unspecified: Secondary | ICD-10-CM

## 2019-05-06 NOTE — Telephone Encounter (Signed)
Orders are written

## 2019-05-07 ENCOUNTER — Encounter (HOSPITAL_COMMUNITY): Payer: Self-pay

## 2019-05-07 NOTE — Telephone Encounter (Signed)
Called patient to inform him that order is placed for picc line removal. Patient is aware of appointment and time. Will call office if he has any additional questions/concerns. [place]

## 2019-05-08 ENCOUNTER — Encounter (HOSPITAL_COMMUNITY): Payer: Self-pay

## 2019-05-09 ENCOUNTER — Encounter (HOSPITAL_COMMUNITY): Payer: Self-pay

## 2019-05-10 ENCOUNTER — Encounter (HOSPITAL_COMMUNITY): Payer: Self-pay

## 2019-05-11 ENCOUNTER — Encounter (HOSPITAL_COMMUNITY): Payer: Self-pay

## 2019-05-12 ENCOUNTER — Encounter (HOSPITAL_COMMUNITY): Payer: Self-pay | Admitting: Radiology

## 2019-05-12 ENCOUNTER — Ambulatory Visit (HOSPITAL_COMMUNITY)
Admission: RE | Admit: 2019-05-12 | Discharge: 2019-05-12 | Disposition: A | Discharge status: Home / Self Care | Payer: Self-pay | Source: Ambulatory Visit | Attending: Infectious Disease | Admitting: Infectious Disease

## 2019-05-12 ENCOUNTER — Other Ambulatory Visit (HOSPITAL_COMMUNITY): Payer: Self-pay | Admitting: Infectious Disease

## 2019-05-12 ENCOUNTER — Other Ambulatory Visit: Payer: Self-pay

## 2019-05-12 DIAGNOSIS — M869 Osteomyelitis, unspecified: Secondary | ICD-10-CM

## 2019-05-12 HISTORY — PX: IR PATIENT EVAL TECH 0-60 MINS: IMG5564

## 2019-05-12 NOTE — Procedures (Signed)
Patient came in for a PICC removal. Removed dressing and PICC came out intact measuring 43 cm.  Bandage placed and and educated patient.  France Ravens RT R, VI Brandy Mullis RT R

## 2019-05-15 ENCOUNTER — Encounter (HOSPITAL_COMMUNITY): Payer: Self-pay | Admitting: Emergency Medicine

## 2019-05-15 ENCOUNTER — Inpatient Hospital Stay (HOSPITAL_COMMUNITY)
Admission: EM | Admit: 2019-05-15 | Discharge: 2019-05-17 | DRG: 617 | Disposition: A | Discharge status: Home / Self Care | Payer: Self-pay | Attending: Internal Medicine | Admitting: Internal Medicine

## 2019-05-15 ENCOUNTER — Emergency Department (HOSPITAL_COMMUNITY): Payer: Self-pay

## 2019-05-15 ENCOUNTER — Other Ambulatory Visit: Payer: Self-pay

## 2019-05-15 DIAGNOSIS — I872 Venous insufficiency (chronic) (peripheral): Secondary | ICD-10-CM | POA: Diagnosis present

## 2019-05-15 DIAGNOSIS — L03031 Cellulitis of right toe: Secondary | ICD-10-CM | POA: Diagnosis present

## 2019-05-15 DIAGNOSIS — L97516 Non-pressure chronic ulcer of other part of right foot with bone involvement without evidence of necrosis: Secondary | ICD-10-CM | POA: Diagnosis present

## 2019-05-15 DIAGNOSIS — E1165 Type 2 diabetes mellitus with hyperglycemia: Secondary | ICD-10-CM | POA: Diagnosis present

## 2019-05-15 DIAGNOSIS — Z8349 Family history of other endocrine, nutritional and metabolic diseases: Secondary | ICD-10-CM

## 2019-05-15 DIAGNOSIS — Z8701 Personal history of pneumonia (recurrent): Secondary | ICD-10-CM

## 2019-05-15 DIAGNOSIS — D72829 Elevated white blood cell count, unspecified: Secondary | ICD-10-CM

## 2019-05-15 DIAGNOSIS — F17210 Nicotine dependence, cigarettes, uncomplicated: Secondary | ICD-10-CM | POA: Diagnosis present

## 2019-05-15 DIAGNOSIS — Z833 Family history of diabetes mellitus: Secondary | ICD-10-CM

## 2019-05-15 DIAGNOSIS — E1169 Type 2 diabetes mellitus with other specified complication: Secondary | ICD-10-CM | POA: Diagnosis present

## 2019-05-15 DIAGNOSIS — E876 Hypokalemia: Secondary | ICD-10-CM

## 2019-05-15 DIAGNOSIS — E11621 Type 2 diabetes mellitus with foot ulcer: Secondary | ICD-10-CM | POA: Diagnosis present

## 2019-05-15 DIAGNOSIS — Z794 Long term (current) use of insulin: Secondary | ICD-10-CM

## 2019-05-15 DIAGNOSIS — E1140 Type 2 diabetes mellitus with diabetic neuropathy, unspecified: Secondary | ICD-10-CM | POA: Diagnosis present

## 2019-05-15 DIAGNOSIS — I10 Essential (primary) hypertension: Secondary | ICD-10-CM | POA: Diagnosis present

## 2019-05-15 DIAGNOSIS — Z20828 Contact with and (suspected) exposure to other viral communicable diseases: Secondary | ICD-10-CM | POA: Diagnosis present

## 2019-05-15 DIAGNOSIS — M869 Osteomyelitis, unspecified: Secondary | ICD-10-CM

## 2019-05-15 DIAGNOSIS — R7989 Other specified abnormal findings of blood chemistry: Secondary | ICD-10-CM

## 2019-05-15 DIAGNOSIS — F329 Major depressive disorder, single episode, unspecified: Secondary | ICD-10-CM | POA: Diagnosis present

## 2019-05-15 DIAGNOSIS — G8929 Other chronic pain: Secondary | ICD-10-CM | POA: Diagnosis present

## 2019-05-15 DIAGNOSIS — E11628 Type 2 diabetes mellitus with other skin complications: Principal | ICD-10-CM

## 2019-05-15 DIAGNOSIS — L089 Local infection of the skin and subcutaneous tissue, unspecified: Secondary | ICD-10-CM

## 2019-05-15 DIAGNOSIS — Z8249 Family history of ischemic heart disease and other diseases of the circulatory system: Secondary | ICD-10-CM

## 2019-05-15 LAB — COMPREHENSIVE METABOLIC PANEL
ALT: 21 U/L (ref 0–44)
AST: 28 U/L (ref 15–41)
Albumin: 4 g/dL (ref 3.5–5.0)
Alkaline Phosphatase: 72 U/L (ref 38–126)
Anion gap: 15 (ref 5–15)
BUN: 6 mg/dL (ref 6–20)
CO2: 24 mmol/L (ref 22–32)
Calcium: 9.4 mg/dL (ref 8.9–10.3)
Chloride: 96 mmol/L — ABNORMAL LOW (ref 98–111)
Creatinine, Ser: 0.71 mg/dL (ref 0.61–1.24)
GFR calc Af Amer: >60 mL/min (ref >60)
GFR calc non Af Amer: >60 mL/min (ref >60)
Glucose, Bld: 206 mg/dL — ABNORMAL HIGH (ref 70–99)
Potassium: 3.1 mmol/L — ABNORMAL LOW (ref 3.5–5.1)
Sodium: 135 mmol/L (ref 135–145)
Total Bilirubin: 0.6 mg/dL (ref 0.3–1.2)
Total Protein: 7.8 g/dL (ref 6.5–8.1)

## 2019-05-15 LAB — CBC WITH DIFFERENTIAL/PLATELET
Abs Immature Granulocytes: 0.03 10*3/uL (ref 0.00–0.07)
Basophils Absolute: 0.1 10*3/uL (ref 0.0–0.1)
Basophils Relative: 1 %
Eosinophils Absolute: 0.2 10*3/uL (ref 0.0–0.5)
Eosinophils Relative: 2 %
HCT: 46.5 % (ref 39.0–52.0)
Hemoglobin: 15.5 g/dL (ref 13.0–17.0)
Immature Granulocytes: 0 %
Lymphocytes Relative: 30 %
Lymphs Abs: 3.6 10*3/uL (ref 0.7–4.0)
MCH: 29.4 pg (ref 26.0–34.0)
MCHC: 33.3 g/dL (ref 30.0–36.0)
MCV: 88.1 fL (ref 80.0–100.0)
Monocytes Absolute: 1.2 10*3/uL — ABNORMAL HIGH (ref 0.1–1.0)
Monocytes Relative: 10 %
Neutro Abs: 7 10*3/uL (ref 1.7–7.7)
Neutrophils Relative %: 57 %
Platelets: 245 10*3/uL (ref 150–400)
RBC: 5.28 MIL/uL (ref 4.22–5.81)
RDW: 12.8 % (ref 11.5–15.5)
WBC: 12.1 10*3/uL — ABNORMAL HIGH (ref 4.0–10.5)
nRBC: 0 % (ref 0.0–0.2)

## 2019-05-15 LAB — CBG MONITORING, ED
Glucose-Capillary: 223 mg/dL — ABNORMAL HIGH (ref 70–99)
Glucose-Capillary: 128 mg/dL — ABNORMAL HIGH (ref 70–99)

## 2019-05-15 LAB — SARS CORONAVIRUS 2 (TAT 6-24 HRS): SARS Coronavirus 2: NEGATIVE

## 2019-05-15 LAB — LACTIC ACID, PLASMA
Lactic Acid, Venous: 2.3 mmol/L (ref 0.5–1.9)
Lactic Acid, Venous: 1.4 mmol/L (ref 0.5–1.9)

## 2019-05-15 LAB — URINALYSIS, ROUTINE W REFLEX MICROSCOPIC
Bilirubin Urine: NEGATIVE
Glucose, UA: NEGATIVE mg/dL
Hgb urine dipstick: NEGATIVE
Ketones, ur: NEGATIVE mg/dL
Leukocytes,Ua: NEGATIVE
Nitrite: NEGATIVE
Protein, ur: NEGATIVE mg/dL
Specific Gravity, Urine: 1.005 (ref 1.005–1.030)
pH: 7 (ref 5.0–8.0)

## 2019-05-15 MED ORDER — LISINOPRIL 40 MG PO TABS
40.0000 mg | ORAL_TABLET | Freq: Every day | ORAL | Status: DC
  Administered 2019-05-15 – 2019-05-17 (×3): 40 mg via ORAL
  Filled 2019-05-15: qty 2
  Filled 2019-05-15 (×2): qty 1

## 2019-05-15 MED ORDER — INSULIN ASPART 100 UNIT/ML ~~LOC~~ SOLN
0.0000 [IU] | Freq: Three times a day (TID) | SUBCUTANEOUS | Status: DC
  Administered 2019-05-16: 8 [IU] via SUBCUTANEOUS
  Administered 2019-05-16: 2 [IU] via SUBCUTANEOUS
  Administered 2019-05-17: 3 [IU] via SUBCUTANEOUS

## 2019-05-15 MED ORDER — INSULIN GLARGINE 100 UNIT/ML ~~LOC~~ SOLN
20.0000 [IU] | Freq: Every day | SUBCUTANEOUS | Status: DC
  Administered 2019-05-15 – 2019-05-16 (×2): 20 [IU] via SUBCUTANEOUS
  Filled 2019-05-15 (×6): qty 0.2

## 2019-05-15 MED ORDER — VANCOMYCIN HCL 10 G IV SOLR
2500.0000 mg | Freq: Once | INTRAVENOUS | Status: AC
  Administered 2019-05-15: 2500 mg via INTRAVENOUS
  Filled 2019-05-15: qty 2500

## 2019-05-15 MED ORDER — VANCOMYCIN HCL 10 G IV SOLR
1750.0000 mg | Freq: Two times a day (BID) | INTRAVENOUS | Status: DC
  Administered 2019-05-16 – 2019-05-17 (×3): 1750 mg via INTRAVENOUS
  Filled 2019-05-15 (×6): qty 1750

## 2019-05-15 MED ORDER — ENOXAPARIN SODIUM 40 MG/0.4ML ~~LOC~~ SOLN
40.0000 mg | SUBCUTANEOUS | Status: DC
  Administered 2019-05-15 – 2019-05-16 (×2): 40 mg via SUBCUTANEOUS
  Filled 2019-05-15 (×3): qty 0.4

## 2019-05-15 MED ORDER — SODIUM CHLORIDE 0.9% FLUSH
3.0000 mL | Freq: Once | INTRAVENOUS | Status: AC
  Administered 2019-05-15: 3 mL via INTRAVENOUS

## 2019-05-15 MED ORDER — POLYETHYLENE GLYCOL 3350 17 G PO PACK: 17.0000 g | PACK | Freq: Every day | ORAL | Status: DC | PRN

## 2019-05-15 MED ORDER — HYDROXYZINE HCL 10 MG PO TABS
10.0000 mg | ORAL_TABLET | Freq: Once | ORAL | Status: AC
  Administered 2019-05-15: 10 mg via ORAL
  Filled 2019-05-15: qty 1

## 2019-05-15 MED ORDER — VANCOMYCIN HCL IN DEXTROSE 1-5 GM/200ML-% IV SOLN: 1000.0000 mg | Freq: Once | INTRAVENOUS | Status: DC

## 2019-05-15 MED ORDER — INSULIN ASPART 100 UNIT/ML ~~LOC~~ SOLN
0.0000 [IU] | Freq: Every day | SUBCUTANEOUS | Status: DC
  Administered 2019-05-16: 3 [IU] via SUBCUTANEOUS

## 2019-05-15 MED ORDER — ACETAMINOPHEN 650 MG RE SUPP: 650.0000 mg | Freq: Four times a day (QID) | RECTAL | Status: DC | PRN

## 2019-05-15 MED ORDER — GABAPENTIN 100 MG PO CAPS
100.0000 mg | ORAL_CAPSULE | Freq: Three times a day (TID) | ORAL | Status: DC
  Administered 2019-05-15 – 2019-05-17 (×5): 100 mg via ORAL
  Filled 2019-05-15 (×7): qty 1

## 2019-05-15 MED ORDER — PIPERACILLIN-TAZOBACTAM 3.375 G IVPB 30 MIN: 3.3750 g | Freq: Once | INTRAVENOUS | Status: DC

## 2019-05-15 MED ORDER — PIPERACILLIN-TAZOBACTAM 3.375 G IVPB 30 MIN
3.3750 g | Freq: Once | INTRAVENOUS | Status: AC
  Administered 2019-05-15: 3.375 g via INTRAVENOUS
  Filled 2019-05-15: qty 50

## 2019-05-15 MED ORDER — FENTANYL CITRATE (PF) 100 MCG/2ML IJ SOLN
50.0000 ug | Freq: Once | INTRAMUSCULAR | Status: AC
  Administered 2019-05-15: 50 ug via INTRAVENOUS
  Filled 2019-05-15: qty 2

## 2019-05-15 MED ORDER — POTASSIUM CHLORIDE 20 MEQ PO PACK
40.0000 meq | PACK | Freq: Once | ORAL | Status: AC
  Administered 2019-05-15: 40 meq via ORAL
  Filled 2019-05-15: qty 2

## 2019-05-15 MED ORDER — IBUPROFEN 400 MG PO TABS: 800.0000 mg | ORAL_TABLET | Freq: Three times a day (TID) | ORAL | Status: DC | PRN

## 2019-05-15 MED ORDER — LACTATED RINGERS IV BOLUS
1000.0000 mL | Freq: Once | INTRAVENOUS | Status: AC
  Administered 2019-05-15: 1000 mL via INTRAVENOUS

## 2019-05-15 MED ORDER — ACETAMINOPHEN 325 MG PO TABS: 650.0000 mg | ORAL_TABLET | Freq: Four times a day (QID) | ORAL | Status: DC | PRN

## 2019-05-15 MED ORDER — POTASSIUM CHLORIDE 20 MEQ PO PACK: 40.0000 meq | PACK | Freq: Two times a day (BID) | ORAL | Status: DC

## 2019-05-15 MED ORDER — OXYCODONE HCL 5 MG PO TABS
5.0000 mg | ORAL_TABLET | ORAL | Status: DC | PRN
  Administered 2019-05-15 – 2019-05-17 (×8): 5 mg via ORAL
  Filled 2019-05-15 (×8): qty 1

## 2019-05-15 MED ORDER — PIPERACILLIN-TAZOBACTAM 3.375 G IVPB
3.3750 g | Freq: Three times a day (TID) | INTRAVENOUS | Status: DC
  Administered 2019-05-15 – 2019-05-17 (×5): 3.375 g via INTRAVENOUS
  Filled 2019-05-15 (×7): qty 50

## 2019-05-15 MED ORDER — AMLODIPINE BESYLATE 10 MG PO TABS
10.0000 mg | ORAL_TABLET | Freq: Every day | ORAL | Status: DC
  Administered 2019-05-15 – 2019-05-17 (×3): 10 mg via ORAL
  Filled 2019-05-15 (×3): qty 1
  Filled 2019-05-15: qty 2

## 2019-05-15 NOTE — H&P (Signed)
History and Physical    Chris Smith UEA:[id number] DOB: 03-Mar-1976 DOA: 05/15/2019  PCP: Patient, No Pcp Per  Patient coming from: home   I have personally briefly reviewed patient's old medical records available.   Chief Complaint: Right foot pain swelling and open ulcers more than a month.  HPI: Chris Smith is a 43 y.o. male with medical history significant of type 2 diabetes on insulin, chronic back pain, hypertension, smoker who was admitted to our hospital 2 times in last 2 months with nonhealing right foot ulcer, MRI evidence of osteomyelitis, patient chose nonoperative treatment and discharged on IV antibiotics comes back to the emergency room with ongoing pain swelling, drainage.  Patient was in the hospital 9/8-9/11, 10/9-10/15 for similar problem.  Last admission, he was advised amputation of the toe, declined, infectious disease consulted, PICC line placed and discharged on Invanz 1 g daily and oral doxycycline.  He was coming to infusion center, his car broke and did not show up for 2 days, was called back, PICC line was taken out and started on oral antibiotics that he is taking.  Today morning, he also noticed another small ulceration proximal to earlier ulcer. He has pain which is mostly shooting pain, episodic aggravating his neuropathy. Denies any fever chills.  Blood sugars are not well controlled at home. Denies any nausea vomiting, headache, visual changes, chest pain or shortness of breath.  Denies any abdominal pain.  Denies any urinary changes or bowel changes. ED Course: Hemodynamically stable.  Lactic acid 2.3, WBC count 12.1.  Foot x-ray does not show any obvious bony destruction.  There is frank drainage from the foot.  Blood cultures were drawn and started on broad-spectrum antibiotics with vancomycin and Zosyn.  Review of Systems: all systems are reviewed and pertinent positive as per HPI otherwise rest are negative.    Past Medical History:  Diagnosis Date  . Acute  respiratory failure ([place]) 09/2014  . Amnesia   . Aspiration pneumonia ([place]) 09/2014  . Chronic back pain   . Diabetes mellitus without complication ([place])   . Encephalopathy 09/2014  . ETOH abuse   . Hypertension   . Ischemic hepatitis unknown  . Rhabdomyolysis unknown  . Sciatica     Past Surgical History:  Procedure Laterality Date  . APPENDECTOMY    . BACK SURGERY  2002   Lumbar  . IR PATIENT EVAL TECH 0-60 MINS  05/12/2019     reports that he has been smoking cigarettes. He has a 9.00 pack-year smoking history. He has never used smokeless tobacco. He reports current alcohol use. He reports that he does not use drugs.  No Known Allergies  Family History  Problem Relation Age of Onset  . Cancer Mother        cause of death, ovarian & colon  . Diabetes Father   . Hypertension Father   . Gout Father   . Hyperlipidemia Father      Prior to Admission medications   Medication Sig Start Date End Date Taking? Authorizing Provider  amLODipine (NORVASC) 10 MG tablet Take 1 tablet (10 mg total) by mouth daily. 04/16/19 05/16/19 Yes Dahal, Marlowe Aschoff, MD  doxycycline (VIBRA-TABS) 100 MG tablet Take 1 tablet (100 mg total) by mouth every 12 (twelve) hours. 04/15/19 05/27/19 Yes Dahal, Marlowe Aschoff, MD  gabapentin (NEURONTIN) 100 MG capsule Take 1 capsule (100 mg total) by mouth 3 (three) times daily. 04/15/19 05/15/19 Yes Dahal, Marlowe Aschoff, MD  glipiZIDE (GLUCOTROL) 5 MG tablet Take 0.5 tablets (2.5  mg total) by mouth daily before breakfast. 04/16/19 05/16/19 Yes Dahal, Marlowe Aschoff, MD  ibuprofen (ADVIL) 200 MG tablet Take 800 mg by mouth every 8 (eight) hours as needed for moderate pain.   Yes [provider]  Insulin Glargine (BASAGLAR KWIKPEN) 100 UNIT/ML SOPN Inject 0.2 mLs (20 Units total) into the skin daily. 04/15/19 05/15/19 Yes Dahal, Marlowe Aschoff, MD  lisinopril (ZESTRIL) 40 MG tablet Take 1 tablet (40 mg total) by mouth daily. 04/16/19 05/16/19 Yes Dahal, Marlowe Aschoff, MD  metFORMIN (GLUCOPHAGE)  500 MG tablet Take 1 tablet (500 mg total) by mouth 2 (two) times daily with a meal. 04/15/19 05/15/19 Yes Dahal, Marlowe Aschoff, MD  glucose monitoring kit (FREESTYLE) monitoring kit 1 each by Does not apply route 4 (four) times daily - after meals and at bedtime. 1 month Diabetic Testing Supplies for QAC-QHS accuchecks. 04/17/19   Terrilee Croak, MD  Insulin Pen Needle (PEN NEEDLES) 31G X 8 MM MISC Use as directed 04/16/19   Terrilee Croak, MD    Physical Exam: Vitals:   05/15/19 1152 05/15/19 1154 05/15/19 1358 05/15/19 1528  BP: (!) 117/105  117/81 (!) 159/112  Pulse: 100  (!) 102 (!) 103  Resp: '18  18 19  ' Temp: 97.8 F (36.6 C)     TempSrc: Oral     SpO2: 100%  98% 100%  Weight:  111.1 kg    Height:  6' (1.829 m)      Constitutional: NAD, calm, comfortable Vitals:   05/15/19 1152 05/15/19 1154 05/15/19 1358 05/15/19 1528  BP: (!) 117/105  117/81 (!) 159/112  Pulse: 100  (!) 102 (!) 103  Resp: '18  18 19  ' Temp: 97.8 F (36.6 C)     TempSrc: Oral     SpO2: 100%  98% 100%  Weight:  111.1 kg    Height:  6' (1.829 m)     Eyes: PERRL, lids and conjunctivae normal ENMT: Mucous membranes are moist. Posterior pharynx clear of any exudate or lesions.Normal dentition.  Neck: normal, supple, no masses, no thyromegaly Respiratory: clear to auscultation bilaterally, no wheezing, no crackles. Normal respiratory effort. No accessory muscle use.  Cardiovascular: Regular rate and rhythm, no murmurs / rubs / gallops. No extremity edema. 2+ pedal pulses. No carotid bruits.  Abdomen: no tenderness, no masses palpated. No hepatosplenomegaly. Bowel sounds positive.  Musculoskeletal: no clubbing / cyanosis. No joint deformity upper and lower extremities. Good ROM, no contractures. Normal muscle tone.  Skin: no rashes, lesions, ulcers. No induration Neurologic: CN 2-12 grossly intact. Sensation intact, DTR normal. Strength 5/5 in all 4.  Psychiatric: Normal judgment and insight. Alert and oriented x 3.  Normal mood.     Has 2 draining ulcer with some fluctuation, redness and streaking to the ankle and lower leg.  Pedal pulses are palpable.  Labs on Admission: I have personally reviewed following labs and imaging studies  CBC: Recent Labs  Lab 05/15/19 1201  WBC 12.1*  NEUTROABS 7.0  HGB 15.5  HCT 46.5  MCV 88.1  PLT 992   Basic Metabolic Panel: Recent Labs  Lab 05/15/19 1201  NA 135  K 3.1*  CL 96*  CO2 24  GLUCOSE 206*  BUN 6  CREATININE 0.71  CALCIUM 9.4   GFR: Estimated Creatinine Clearance: 154.8 mL/min (by C-G formula based on SCr of 0.71 mg/dL). Liver Function Tests: Recent Labs  Lab 05/15/19 1201  AST 28  ALT 21  ALKPHOS 72  BILITOT 0.6  PROT 7.8  ALBUMIN 4.0  No results for input(s): LIPASE, AMYLASE in the last 168 hours. No results for input(s): AMMONIA in the last 168 hours. Coagulation Profile: No results for input(s): INR, PROTIME in the last 168 hours. Cardiac Enzymes: No results for input(s): CKTOTAL, CKMB, CKMBINDEX, TROPONINI in the last 168 hours. BNP (last 3 results) No results for input(s): PROBNP in the last 8760 hours. HbA1C: No results for input(s): HGBA1C in the last 72 hours. CBG: Recent Labs  Lab 05/15/19 1151  GLUCAP 223*   Lipid Profile: No results for input(s): CHOL, HDL, LDLCALC, TRIG, CHOLHDL, LDLDIRECT in the last 72 hours. Thyroid Function Tests: No results for input(s): TSH, T4TOTAL, FREET4, T3FREE, THYROIDAB in the last 72 hours. Anemia Panel: No results for input(s): VITAMINB12, FOLATE, FERRITIN, TIBC, IRON, RETICCTPCT in the last 72 hours. Urine analysis:    Component Value Date/Time   COLORURINE YELLOW 05/15/2019 1400   APPEARANCEUR CLEAR 05/15/2019 1400   LABSPEC 1.005 05/15/2019 1400   PHURINE 7.0 05/15/2019 1400   GLUCOSEU NEGATIVE 05/15/2019 1400   HGBUR NEGATIVE 05/15/2019 1400   BILIRUBINUR NEGATIVE 05/15/2019 1400   KETONESUR NEGATIVE 05/15/2019 1400   PROTEINUR NEGATIVE 05/15/2019 1400    NITRITE NEGATIVE 05/15/2019 1400   LEUKOCYTESUR NEGATIVE 05/15/2019 1400    Radiological Exams on Admission: Dg Foot Complete Right  Result Date: 05/15/2019 CLINICAL DATA:  Right first toe infection. EXAM: RIGHT FOOT COMPLETE - 3+ VIEW COMPARISON:  March 11, 2019. FINDINGS: There is no evidence of fracture or dislocation. There is no evidence of arthropathy or other focal bone abnormality. No lytic destruction is seen to suggest osteomyelitis. Large ulceration is seen in plantar soft tissues of the first distal phalanx. IMPRESSION: Large soft tissue ulceration is seen in plantar tissues of first toe. No lytic destruction is seen to suggest osteomyelitis. Electronically Signed   By: Marijo Conception M.D.   On: 05/15/2019 12:36     Assessment/Plan Principal Problem:   Type 2 diabetes mellitus with diabetic foot infection ([place]) Active Problems:   Hypertension   Hypokalemia     1.  Type 2 diabetes with diabetic foot infection: Right great toe osteomyelitis.  Incompletely treated. Agree with admission given severity of symptoms.  We will continue vancomycin and Zosyn.  Patient will probably need surgical debridement and amputation. We will consult orthopedics, seen by Dr. Sharol Given in the past.  Deep surgical cultures will be appreciated, superficial cultures were not collected.  2.  Type 2 diabetes with hyperglycemia: Poorly controlled.  On insulin and oral hypoglycemics at home.  Resume insulin.  Keep on sliding scale insulin.  Holding oral hypoglycemics.  3.  Hypertension: Blood pressure stable.  Resume home medications.  4.  Hypokalemia: Replaced.  Patient has significant symptoms including diabetic foot ulcer, he will need IV antibiotics and surgical correction including but not limited to amputation of the toe.  Patient is anticipated to stay for more than 2 midnights.  DVT prophylaxis: Subcu Lovenox Code Status: Full code Family Communication: None Disposition Plan: Ultimately home  after hospitalization Consults called: Orthopedics, Dr. Sharol Given Admission status: Inpatient MedSurg   Barb Merino MD [facility] Pager [phone]

## 2019-05-15 NOTE — Progress Notes (Signed)
Patient ID: Chris Smith, male   DOB: 09-02-1975, 43 y.o.   MRN: [id number] Patient is a 43 year old gentleman who is readmitted for osteomyelitis of the right great toe.  Patient was to undergo amputation of the right great toe at his last admission however patient states he had things to do at home.  Patient presents at this time wishing to proceed with surgery.  I will place a full consult in the morning and will post him for surgery tomorrow for amputation of the right great toe.  N.p.o. after midnight.

## 2019-05-15 NOTE — ED Triage Notes (Signed)
Pt reports having a diabetic ulcer and getting IV abx through PICC line. States a new ulcer has began and thinks they need to amputate the toe. Has appt on 18th with [facility].

## 2019-05-15 NOTE — ED Provider Notes (Addendum)
[facility] EMERGENCY DEPARTMENT Provider Note   CSN: [id number] Arrival date & time: 05/15/19  1140     History   Chief Complaint Chief Complaint  Patient presents with  . Foot Ulcer    HPI Chris Smith is a 43 y.o. male.     HPI   Patient presents after missing 2 appointments for PICC line antibiotics for a diabetic foot ulcer in his right foot that has been worsening.  After missing multiple appointments, his infectious disease provider recommended that he have the PICC line removed.  He was recently removed, but this morning noticed a second ulcer appeared below the first ulcer.  Multiple providers have recommended that he have the right great toe amputated, and he now would like to have it amputated before the infection progresses to further down his foot.  He reports he has not had any fevers, but he has had increasing pain in his right great toe.  He states he has not missed any doses of his insulin, his sugars are well controlled currently.  Movement makes his pain worse, sitting still makes his pain better.  Nothing seems to relieve his symptoms.  Past Medical History:  Diagnosis Date  . Acute respiratory failure ([place]) 09/2014  . Amnesia   . Aspiration pneumonia ([place]) 09/2014  . Chronic back pain   . Diabetes mellitus without complication ([place])   . Encephalopathy 09/2014  . ETOH abuse   . Hypertension   . Ischemic hepatitis unknown  . Rhabdomyolysis unknown  . Sciatica     Patient Active Problem List   Diagnosis Date Noted  . Type 2 diabetes mellitus with diabetic foot infection ([place]) 05/15/2019  . Osteomyelitis of great toe of right foot ([place])   . Diabetic polyneuropathy associated with type 2 diabetes mellitus ([place])   . Diabetic foot ulcer ([place]) 04/11/2019  . Osteomyelitis due to type 2 diabetes mellitus ([place]) 03/12/2019  . Sepsis ([place]) 03/12/2019  . Hyperglycemia 03/12/2019  . Poorly controlled diabetes mellitus ([place]) 03/12/2019  . Diabetic  ulcer of right great toe ([place]) 03/11/2019  . Dehydration   . Nausea with vomiting 09/07/2016  . Hypokalemia 09/07/2016  . Abdominal pain 04/28/2016  . Enteritis 04/28/2016  . Acidosis 04/28/2016  . Prolonged Q-T interval on ECG 04/28/2016  . Hypomagnesemia 04/28/2016  . ETOH abuse 04/28/2016  . Abdominal pain, bilateral upper quadrant 04/28/2016  . Tachycardia 04/28/2016  . Tobacco abuse 04/28/2016  . Obesity 04/28/2016  . Fatty liver 04/28/2016  . Chronic back pain 04/28/2016  . Memory loss 10/25/2014  . Depression 10/25/2014  . Diabetic mononeuropathy associated with diabetes mellitus due to underlying condition ([place]) 10/25/2014  . Amnesia 10/13/2014  . Hypertension 10/13/2014  . Diabetes mellitus without complication ([place]) 16/60/6301  . Lumbago 10/13/2014    Past Surgical History:  Procedure Laterality Date  . APPENDECTOMY    . BACK SURGERY  2002   Lumbar  . IR PATIENT EVAL TECH 0-60 MINS  05/12/2019        Home Medications    Prior to Admission medications   Medication Sig Start Date End Date Taking? Authorizing Provider  amLODipine (NORVASC) 10 MG tablet Take 1 tablet (10 mg total) by mouth daily. 04/16/19 05/16/19 Yes Dahal, Marlowe Aschoff, MD  doxycycline (VIBRA-TABS) 100 MG tablet Take 1 tablet (100 mg total) by mouth every 12 (twelve) hours. 04/15/19 05/27/19 Yes Dahal, Marlowe Aschoff, MD  gabapentin (NEURONTIN) 100 MG capsule Take 1 capsule (100 mg total) by mouth 3 (three) times  daily. 04/15/19 05/15/19 Yes Dahal, Marlowe Aschoff, MD  glipiZIDE (GLUCOTROL) 5 MG tablet Take 0.5 tablets (2.5 mg total) by mouth daily before breakfast. 04/16/19 05/16/19 Yes Dahal, Marlowe Aschoff, MD  ibuprofen (ADVIL) 200 MG tablet Take 800 mg by mouth every 8 (eight) hours as needed for moderate pain.   Yes [provider]  Insulin Glargine (BASAGLAR KWIKPEN) 100 UNIT/ML SOPN Inject 0.2 mLs (20 Units total) into the skin daily. 04/15/19 05/15/19 Yes Dahal, Marlowe Aschoff, MD  lisinopril (ZESTRIL) 40 MG tablet  Take 1 tablet (40 mg total) by mouth daily. 04/16/19 05/16/19 Yes Dahal, Marlowe Aschoff, MD  metFORMIN (GLUCOPHAGE) 500 MG tablet Take 1 tablet (500 mg total) by mouth 2 (two) times daily with a meal. 04/15/19 05/15/19 Yes Dahal, Marlowe Aschoff, MD  glucose monitoring kit (FREESTYLE) monitoring kit 1 each by Does not apply route 4 (four) times daily - after meals and at bedtime. 1 month Diabetic Testing Supplies for QAC-QHS accuchecks. 04/17/19   Terrilee Croak, MD  Insulin Pen Needle (PEN NEEDLES) 31G X 8 MM MISC Use as directed 04/16/19   Terrilee Croak, MD    Family History Family History  Problem Relation Age of Onset  . Cancer Mother        cause of death, ovarian & colon  . Diabetes Father   . Hypertension Father   . Gout Father   . Hyperlipidemia Father     Social History Social History   Tobacco Use  . Smoking status: Current Every Day Smoker    Packs/day: 0.50    Years: 18.00    Pack years: 9.00    Types: Cigarettes  . Smokeless tobacco: Never Used  Substance Use Topics  . Alcohol use: Yes    Alcohol/week: 0.0 standard drinks    Comment: 3 OR 4 DAYS PER WEEK  . Drug use: No     Allergies   Patient has no known allergies.   Review of Systems Review of Systems  Constitutional: Negative for fever.  Respiratory: Negative for cough and shortness of breath.   Cardiovascular: Negative for chest pain and palpitations.  Gastrointestinal: Negative for abdominal pain, nausea and vomiting.  Musculoskeletal: Positive for arthralgias.  Skin: Negative for rash and wound.  Neurological: Negative for weakness and numbness.  All other systems reviewed and are negative.    Physical Exam Updated Vital Signs BP (!) 159/112 (BP Location: Right Arm)   Pulse (!) 103   Temp 97.8 F (36.6 C) (Oral)   Resp 19   Ht 6' (1.829 m)   Wt 111.1 kg   SpO2 100%   BMI 33.23 kg/m   Physical Exam Vitals signs and nursing note reviewed.  Constitutional:      Appearance: He is well-developed. He is  obese.  HENT:     Head: Normocephalic and atraumatic.  Eyes:     Conjunctiva/sclera: Conjunctivae normal.  Neck:     Musculoskeletal: Neck supple.  Cardiovascular:     Rate and Rhythm: Regular rhythm. Tachycardia present.     Pulses: Normal pulses.     Heart sounds: No murmur.     Comments: 2+ pulses bilaterally DP and PT Pulmonary:     Effort: Pulmonary effort is normal. No respiratory distress.     Breath sounds: Normal breath sounds.  Abdominal:     General: There is no distension.     Palpations: Abdomen is soft.     Tenderness: There is no abdominal tenderness. There is no guarding.  Skin:    General: Skin is  warm and dry.     Findings: Lesion present.     Comments: Deep ulceration of the right great toe with a small ulceration right below  Neurological:     General: No focal deficit present.     Mental Status: He is alert and oriented to person, place, and time.     Sensory: No sensory deficit.     Motor: No weakness.     Gait: Gait normal.     Comments: No sensation abnormalities, no movement difficulties          ED Treatments / Results  Labs (all labs ordered are listed, but only abnormal results are displayed) Labs Reviewed  LACTIC ACID, PLASMA - Abnormal; Notable for the following components:      Result Value   Lactic Acid, Venous 2.3 (*)    All other components within normal limits  COMPREHENSIVE METABOLIC PANEL - Abnormal; Notable for the following components:   Potassium 3.1 (*)    Chloride 96 (*)    Glucose, Bld 206 (*)    All other components within normal limits  CBC WITH DIFFERENTIAL/PLATELET - Abnormal; Notable for the following components:   WBC 12.1 (*)    Monocytes Absolute 1.2 (*)    All other components within normal limits  CBG MONITORING, ED - Abnormal; Notable for the following components:   Glucose-Capillary 223 (*)    All other components within normal limits  CULTURE, BLOOD (ROUTINE X 2)  CULTURE, BLOOD (ROUTINE X 2)  SARS  CORONAVIRUS 2 (TAT 6-24 HRS)  LACTIC ACID, PLASMA  URINALYSIS, ROUTINE W REFLEX MICROSCOPIC    EKG None  Radiology Dg Foot Complete Right  Result Date: 05/15/2019 CLINICAL DATA:  Right first toe infection. EXAM: RIGHT FOOT COMPLETE - 3+ VIEW COMPARISON:  March 11, 2019. FINDINGS: There is no evidence of fracture or dislocation. There is no evidence of arthropathy or other focal bone abnormality. No lytic destruction is seen to suggest osteomyelitis. Large ulceration is seen in plantar soft tissues of the first distal phalanx. IMPRESSION: Large soft tissue ulceration is seen in plantar tissues of first toe. No lytic destruction is seen to suggest osteomyelitis. Electronically Signed   By: Marijo Conception M.D.   On: 05/15/2019 12:36    Procedures Procedures (including critical care time)  Medications Ordered in ED Medications  piperacillin-tazobactam (ZOSYN) IVPB 3.375 g (has no administration in time range)    Followed by  piperacillin-tazobactam (ZOSYN) IVPB 3.375 g (has no administration in time range)  vancomycin (VANCOCIN) 2,500 mg in sodium chloride 0.9 % 500 mL IVPB (has no administration in time range)  vancomycin (VANCOCIN) 1,750 mg in sodium chloride 0.9 % 500 mL IVPB (has no administration in time range)  hydrOXYzine (ATARAX/VISTARIL) tablet 10 mg (has no administration in time range)  fentaNYL (SUBLIMAZE) injection 50 mcg (has no administration in time range)  potassium chloride (KLOR-CON) packet 40 mEq (has no administration in time range)  sodium chloride flush (NS) 0.9 % injection 3 mL (3 mLs Intravenous Given 05/15/19 1607)  lactated ringers bolus 1,000 mL (1,000 mLs Intravenous New Bag/Given 05/15/19 1605)     Initial Impression / Assessment and Plan / ED Course  I have reviewed the triage vital signs and the nursing notes.  Pertinent labs & imaging results that were available during my care of the patient were reviewed by me and considered in my medical decision  making (see chart for details).        Chris Smith is  a 43 y.o. male with early osteomyelitis noticed in an MRI in September 2020 due to a diabetic foot ulcer.  He has had multiple providers recommend amputation.  Recently, he was on ertapenem and p.o. doxycycline.  He has not had antibiotics in 1 week due to 2 missed appointments and his PICC line being removed.  Labs and imaging ordered to evaluate for worsening osteomyelitis, sepsis, electrolyte abnormality.  Lactic acid is elevated, mild hypokalemia present that is repleted, x-ray ordered that did not show osteomyelitis but no early osteomyelitis noticed on previous MRI.  LR bolus ordered, IV antibiotics initiated with pharmacy consult (vancomycin and Zosyn) and admission to medicine planned for further evaluation and management.  Patient will likely need amputation for worsening infection, worsening clinical picture diabetic foot ulcer that may be septic.  Care of patient discussed with the supervising attending.  Final Clinical Impressions(s) / ED Diagnoses   Final diagnoses:  Diabetic foot infection ([place])  Hypokalemia  Elevated lactic acid level  Leukocytosis, unspecified type  Osteomyelitis of right foot, unspecified type [facility])    ED Discharge Orders    None         Julianne Rice, MD 05/15/19 1710    Tegeler, Gwenyth Allegra, MD 05/17/19 0202    Tegeler, Gwenyth Allegra, MD 05/23/19 1430

## 2019-05-15 NOTE — Progress Notes (Signed)
Pharmacy Antibiotic Note  Chris Smith is a 43 y.o. male admitted on 05/15/2019 with Osteomyelitis.  Pharmacy has been consulted for Vancomycin dosing.  Height: 6' (182.9 cm) Weight: 245 lb (111.1 kg) IBW/kg (Calculated) : 77.6  Temp (24hrs), Avg:97.8 F (36.6 C), Min:97.8 F (36.6 C), Max:97.8 F (36.6 C)  Recent Labs  Lab 05/15/19 1201  WBC 12.1*  CREATININE 0.71  LATICACIDVEN 2.3*    Estimated Creatinine Clearance: 154.8 mL/min (by C-G formula based on SCr of 0.71 mg/dL).    No Known Allergies  Antimicrobials this admission: 11/12 Zosyn >>  11/12 Vancomycin >>    Microbiology results: 11/12 BCx: pending   Assessment:  Patient is a Chris Smith that has a long history of osteomyelitis of his right great toe. Multiple providers have recommended amputation to the patient. The patient however wished to attempt antibiotic treatment before leading to amputation. The patient was most recently d/cd on Ertapenem and PO Doxycycline for outpatient therapy, however after missing several appointments the patient was transitioned to Augmentin and Doxycycline.   Plan:  - Vancomycin 2500 mg IV x 1 dose as loading dose - Vancomycin 1750mg  IV q12h - Est AUC calc of 552 - Will continue to monitor patients renal function and would recommend following levels for this patient as he was on Vancomycin 2000 mg IV q12 previously.   Thank you for allowing pharmacy to be a part of this patient's care.  Duanne Limerick PharmD. BCPS  05/15/2019 4:05 PM

## 2019-05-16 ENCOUNTER — Inpatient Hospital Stay (HOSPITAL_COMMUNITY): Payer: Self-pay | Admitting: Anesthesiology

## 2019-05-16 ENCOUNTER — Encounter (HOSPITAL_COMMUNITY): Payer: Self-pay | Admitting: Anesthesiology

## 2019-05-16 ENCOUNTER — Encounter (HOSPITAL_COMMUNITY)
Admission: EM | Disposition: A | Discharge status: Home / Self Care | Payer: Self-pay | Source: Home / Self Care | Attending: Internal Medicine

## 2019-05-16 DIAGNOSIS — M869 Osteomyelitis, unspecified: Secondary | ICD-10-CM

## 2019-05-16 DIAGNOSIS — E11628 Type 2 diabetes mellitus with other skin complications: Principal | ICD-10-CM

## 2019-05-16 DIAGNOSIS — L089 Local infection of the skin and subcutaneous tissue, unspecified: Secondary | ICD-10-CM

## 2019-05-16 HISTORY — PX: AMPUTATION TOE: SHX6595

## 2019-05-16 LAB — GLUCOSE, CAPILLARY
Glucose-Capillary: 111 mg/dL — ABNORMAL HIGH (ref 70–99)
Glucose-Capillary: 132 mg/dL — ABNORMAL HIGH (ref 70–99)
Glucose-Capillary: 148 mg/dL — ABNORMAL HIGH (ref 70–99)
Glucose-Capillary: 271 mg/dL — ABNORMAL HIGH (ref 70–99)
Glucose-Capillary: 266 mg/dL — ABNORMAL HIGH (ref 70–99)

## 2019-05-16 LAB — SURGICAL PCR SCREEN
MRSA, PCR: NEGATIVE
Staphylococcus aureus: POSITIVE — AB

## 2019-05-16 LAB — CBG MONITORING, ED: Glucose-Capillary: 158 mg/dL — ABNORMAL HIGH (ref 70–99)

## 2019-05-16 SURGERY — AMPUTATION, TOE
Anesthesia: General | Site: Toe | Laterality: Right

## 2019-05-16 MED ORDER — MEPERIDINE HCL 25 MG/ML IJ SOLN: 6.2500 mg | INTRAMUSCULAR | Status: DC | PRN

## 2019-05-16 MED ORDER — FENTANYL CITRATE (PF) 100 MCG/2ML IJ SOLN
INTRAMUSCULAR | Status: DC | PRN
  Administered 2019-05-16 (×2): 75 ug via INTRAVENOUS
  Administered 2019-05-16: 50 ug via INTRAVENOUS

## 2019-05-16 MED ORDER — CEFAZOLIN SODIUM-DEXTROSE 2-4 GM/100ML-% IV SOLN
INTRAVENOUS | Status: AC
  Filled 2019-05-16: qty 100

## 2019-05-16 MED ORDER — DEXMEDETOMIDINE HCL IN NACL 200 MCG/50ML IV SOLN
INTRAVENOUS | Status: AC
  Filled 2019-05-16: qty 50

## 2019-05-16 MED ORDER — MIDAZOLAM HCL 5 MG/5ML IJ SOLN
INTRAMUSCULAR | Status: DC | PRN
  Administered 2019-05-16: 2 mg via INTRAVENOUS

## 2019-05-16 MED ORDER — DEXAMETHASONE SODIUM PHOSPHATE 10 MG/ML IJ SOLN
INTRAMUSCULAR | Status: AC
  Filled 2019-05-16: qty 1

## 2019-05-16 MED ORDER — SODIUM CHLORIDE 0.9 % IV SOLN
INTRAVENOUS | Status: DC
  Administered 2019-05-16: 12:00:00 via INTRAVENOUS

## 2019-05-16 MED ORDER — HYDROMORPHONE HCL 1 MG/ML IJ SOLN
0.2500 mg | INTRAMUSCULAR | Status: DC | PRN
  Administered 2019-05-16 (×2): 0.5 mg via INTRAVENOUS

## 2019-05-16 MED ORDER — DEXMEDETOMIDINE HCL 200 MCG/2ML IV SOLN
INTRAVENOUS | Status: DC | PRN
  Administered 2019-05-16: 12 ug via INTRAVENOUS

## 2019-05-16 MED ORDER — ONDANSETRON HCL 4 MG/2ML IJ SOLN
INTRAMUSCULAR | Status: AC
  Filled 2019-05-16: qty 2

## 2019-05-16 MED ORDER — HYDROMORPHONE HCL 1 MG/ML IJ SOLN
INTRAMUSCULAR | Status: AC
  Filled 2019-05-16: qty 1

## 2019-05-16 MED ORDER — 0.9 % SODIUM CHLORIDE (POUR BTL) OPTIME
TOPICAL | Status: DC | PRN
  Administered 2019-05-16: 1000 mL

## 2019-05-16 MED ORDER — EPHEDRINE 5 MG/ML INJ
INTRAVENOUS | Status: AC
  Filled 2019-05-16: qty 10

## 2019-05-16 MED ORDER — MIDAZOLAM HCL 2 MG/2ML IJ SOLN
INTRAMUSCULAR | Status: AC
  Filled 2019-05-16: qty 2

## 2019-05-16 MED ORDER — LIDOCAINE HCL (CARDIAC) PF 100 MG/5ML IV SOSY
PREFILLED_SYRINGE | INTRAVENOUS | Status: DC | PRN
  Administered 2019-05-16: 100 mg via INTRATRACHEAL

## 2019-05-16 MED ORDER — CEFAZOLIN SODIUM-DEXTROSE 2-4 GM/100ML-% IV SOLN
2.0000 g | INTRAVENOUS | Status: AC
  Administered 2019-05-16: 2 g via INTRAVENOUS

## 2019-05-16 MED ORDER — CEFAZOLIN SODIUM-DEXTROSE 2-4 GM/100ML-% IV SOLN
2.0000 g | INTRAVENOUS | Status: DC
  Filled 2019-05-16: qty 100

## 2019-05-16 MED ORDER — FENTANYL CITRATE (PF) 100 MCG/2ML IJ SOLN
INTRAMUSCULAR | Status: AC
  Filled 2019-05-16: qty 2

## 2019-05-16 MED ORDER — DOCUSATE SODIUM 100 MG PO CAPS
100.0000 mg | ORAL_CAPSULE | Freq: Two times a day (BID) | ORAL | Status: DC
  Administered 2019-05-16 – 2019-05-17 (×3): 100 mg via ORAL
  Filled 2019-05-16 (×3): qty 1

## 2019-05-16 MED ORDER — CHLORHEXIDINE GLUCONATE 4 % EX LIQD
60.0000 mL | Freq: Once | CUTANEOUS | Status: DC
  Filled 2019-05-16: qty 60

## 2019-05-16 MED ORDER — ENSURE PRE-SURGERY PO LIQD
296.0000 mL | Freq: Once | ORAL | Status: DC
  Filled 2019-05-16: qty 296

## 2019-05-16 MED ORDER — ACETAMINOPHEN 10 MG/ML IV SOLN: 1000.0000 mg | Freq: Once | INTRAVENOUS | Status: DC | PRN

## 2019-05-16 MED ORDER — LACTATED RINGERS IV SOLN
INTRAVENOUS | Status: DC
  Administered 2019-05-16: 09:00:00 via INTRAVENOUS

## 2019-05-16 MED ORDER — PROPOFOL 10 MG/ML IV BOLUS
INTRAVENOUS | Status: DC | PRN
  Administered 2019-05-16: 180 mg via INTRAVENOUS

## 2019-05-16 MED ORDER — FENTANYL CITRATE (PF) 250 MCG/5ML IJ SOLN
INTRAMUSCULAR | Status: AC
  Filled 2019-05-16: qty 5

## 2019-05-16 MED ORDER — OXYCODONE HCL 5 MG/5ML PO SOLN: 5.0000 mg | Freq: Once | ORAL | Status: DC | PRN

## 2019-05-16 MED ORDER — ONDANSETRON HCL 4 MG/2ML IJ SOLN
INTRAMUSCULAR | Status: DC | PRN
  Administered 2019-05-16: 4 mg via INTRAVENOUS

## 2019-05-16 MED ORDER — DEXAMETHASONE SODIUM PHOSPHATE 10 MG/ML IJ SOLN
INTRAMUSCULAR | Status: DC | PRN
  Administered 2019-05-16: 10 mg via INTRAVENOUS

## 2019-05-16 MED ORDER — POVIDONE-IODINE 10 % EX SWAB: 2.0000 "application " | Freq: Once | CUTANEOUS | Status: DC

## 2019-05-16 MED ORDER — OXYCODONE HCL 5 MG PO TABS: 5.0000 mg | ORAL_TABLET | Freq: Once | ORAL | Status: DC | PRN

## 2019-05-16 MED ORDER — PROPOFOL 10 MG/ML IV BOLUS
INTRAVENOUS | Status: AC
  Filled 2019-05-16: qty 20

## 2019-05-16 MED ORDER — ONDANSETRON HCL 4 MG/2ML IJ SOLN: 4.0000 mg | Freq: Once | INTRAMUSCULAR | Status: DC | PRN

## 2019-05-16 MED ORDER — LIDOCAINE 2% (20 MG/ML) 5 ML SYRINGE
INTRAMUSCULAR | Status: AC
  Filled 2019-05-16: qty 5

## 2019-05-16 SURGICAL SUPPLY — 26 items
BLADE SURG 21 STRL SS (BLADE) ×3 IMPLANT
BNDG COHESIVE 4X5 TAN STRL (GAUZE/BANDAGES/DRESSINGS) ×3 IMPLANT
BNDG GAUZE ELAST 4 BULKY (GAUZE/BANDAGES/DRESSINGS) ×3 IMPLANT
COVER SURGICAL LIGHT HANDLE (MISCELLANEOUS) ×3 IMPLANT
DRAPE U-SHAPE 47X51 STRL (DRAPES) ×3 IMPLANT
DRSG ADAPTIC 3X8 NADH LF (GAUZE/BANDAGES/DRESSINGS) ×3 IMPLANT
DRSG PAD ABDOMINAL 8X10 ST (GAUZE/BANDAGES/DRESSINGS) ×3 IMPLANT
DURAPREP 26ML APPLICATOR (WOUND CARE) ×3 IMPLANT
ELECT REM PT RETURN 9FT ADLT (ELECTROSURGICAL) ×3
ELECTRODE REM PT RTRN 9FT ADLT (ELECTROSURGICAL) ×1 IMPLANT
GAUZE SPONGE 4X4 12PLY STRL (GAUZE/BANDAGES/DRESSINGS) IMPLANT
GLOVE BIO SURGEON STRL SZ 6.5 (GLOVE) ×2 IMPLANT
GLOVE BIO SURGEONS STRL SZ 6.5 (GLOVE) ×1
GLOVE BIOGEL PI IND STRL 7.0 (GLOVE) ×1 IMPLANT
GLOVE BIOGEL PI IND STRL 9 (GLOVE) ×1 IMPLANT
GLOVE BIOGEL PI INDICATOR 7.0 (GLOVE) ×2
GLOVE BIOGEL PI INDICATOR 9 (GLOVE) ×2
GLOVE SURG ORTHO 9.0 STRL STRW (GLOVE) ×3 IMPLANT
GOWN STRL REUS W/ TWL XL LVL3 (GOWN DISPOSABLE) ×3 IMPLANT
GOWN STRL REUS W/TWL XL LVL3 (GOWN DISPOSABLE) ×6
KIT BASIN OR (CUSTOM PROCEDURE TRAY) ×3 IMPLANT
NS IRRIG 1000ML POUR BTL (IV SOLUTION) ×3 IMPLANT
PACK ORTHO EXTREMITY (CUSTOM PROCEDURE TRAY) ×3 IMPLANT
PAD ARMBOARD 7.5X6 YLW CONV (MISCELLANEOUS) ×6 IMPLANT
SUT ETHILON 2 0 PSLX (SUTURE) ×3 IMPLANT
TOWEL GREEN STERILE (TOWEL DISPOSABLE) ×3 IMPLANT

## 2019-05-16 NOTE — Progress Notes (Signed)
PROGRESS NOTE    Chris Smith  YHC:[id number] DOB: Jun 22, 1976 DOA: 05/15/2019 PCP: Patient, No Pcp Per    Brief Narrative:  From admission HPI Chris Smith is a 43 y.o. male with medical history significant of type 2 diabetes on insulin, chronic back pain, hypertension, smoker who was admitted to our hospital 2 times in last 2 months with nonhealing right foot ulcer, MRI evidence of osteomyelitis, patient chose nonoperative treatment and discharged on IV antibiotics comes back to the emergency room with ongoing pain swelling, drainage.  Patient was in the hospital 9/8-9/11, 10/9-10/15 for similar problem.  Last admission, he was advised amputation of the toe, declined, infectious disease consulted, PICC line placed and discharged on Invanz 1 g daily and oral doxycycline.  He was coming to infusion center, his car broke and did not show up for 2 days, was called back, PICC line was taken out and started on oral antibiotics that he is taking.  Today morning, he also noticed another small ulceration proximal to earlier ulcer. He has pain which is mostly shooting pain, episodic aggravating his neuropathy. Denies any fever chills.  Blood sugars are not well controlled at home. Denies any nausea vomiting, headache, visual changes, chest pain or shortness of breath.  Denies any abdominal pain.  Denies any urinary changes or bowel changes. ED Course: Hemodynamically stable.  Lactic acid 2.3, WBC count 12.1.  Foot x-ray does not show any obvious bony destruction.  There is frank drainage from the foot.  Blood cultures were drawn and started on broad-spectrum antibiotics with vancomycin and Zosyn. Admitted with surgery consult.   Assessment & Plan:   Principal Problem:   Type 2 diabetes mellitus with diabetic foot infection ([place]) Active Problems:   Hypertension   Hypokalemia  Type 2 diabetes with diabetic foot infection, right great toe osteomyelitis, incompletely treated and failed antibiotic therapy:  Continue vancomycin and Zosyn today.  Blood cultures negative so far.  Surgical cultures appreciated.  Underwent great toe amputation to healthy margins and closure of wound today.  Depends upon progress, will treat with oral antibiotics when ready for discharge. Postop management, mobility, Ortho shoes, follow-up as per surgery.  Type 2 diabetes with hyperglycemia: Poorly controlled.  On oral hypoglycemics and insulin at home.  Insulin regimen.  On sliding scale insulin.  Resume oral medications on discharge.  Hypertension: Stable.  Hypokalemia: Stable, replaced, recheck in the morning.   DVT prophylaxis: Subcu Lovenox Code Status: Full code Family Communication: None Disposition Plan: Anticipate home tomorrow if adequate improvement   Consultants:   Orthopedic surgery  Procedures:   Right great toe amputation, 05/16/2019  Antimicrobials:   Vancomycin and Zosyn, 05/15/2019>>>   Subjective: Patient seen and examined after coming back from surgery.  Denied any complaints.  He was very happy to get rid of infected toe.  He does have some pain mostly shooting pain related to neuropathy.  No other overnight events.  Objective: Vitals:   05/16/19 1115 05/16/19 1130 05/16/19 1151 05/16/19 1642  BP: (!) 121/91 114/84 120/88 124/79  Pulse: 73 71 74 79  Resp: 13 17 16 18   Temp:   97.9 F (36.6 C) 98.1 F (36.7 C)  TempSrc:   Oral Oral  SpO2: 100% 98% 95% 95%  Weight:      Height:        Intake/Output Summary (Last 24 hours) at 05/16/2019 1658 Last data filed at 05/16/2019 1109 Gross per 24 hour  Intake 1800 ml  Output 25 ml  Net  1775 ml   Filed Weights   05/15/19 1154 05/16/19 0819  Weight: 111.1 kg 110.4 kg    Examination:  General exam: Appears calm and comfortable, walking in the hallway. Respiratory system: Clear to auscultation. Respiratory effort normal. Cardiovascular system: S1 & S2 heard, RRR. No JVD, murmurs, rubs, gallops or clicks. No pedal edema.  Gastrointestinal system: Abdomen is nondistended, soft and nontender. No organomegaly or masses felt. Normal bowel sounds heard. Central nervous system: Alert and oriented. No focal neurological deficits. Extremities: Symmetric 5 x 5 power. Skin: Right great toe, immediate postop dressing, foot with compression dressing, visible toes with no ischemic changes. Psychiatry: Judgement and insight appear normal. Mood & affect appropriate.     Data Reviewed: I have personally reviewed following labs and imaging studies  CBC: Recent Labs  Lab 05/15/19 1201  WBC 12.1*  NEUTROABS 7.0  HGB 15.5  HCT 46.5  MCV 88.1  PLT 245   Basic Metabolic Panel: Recent Labs  Lab 05/15/19 1201  NA 135  K 3.1*  CL 96*  CO2 24  GLUCOSE 206*  BUN 6  CREATININE 0.71  CALCIUM 9.4   GFR: Estimated Creatinine Clearance: 159.1 mL/min (by C-G formula based on SCr of 0.71 mg/dL). Liver Function Tests: Recent Labs  Lab 05/15/19 1201  AST 28  ALT 21  ALKPHOS 72  BILITOT 0.6  PROT 7.8  ALBUMIN 4.0   No results for input(s): LIPASE, AMYLASE in the last 168 hours. No results for input(s): AMMONIA in the last 168 hours. Coagulation Profile: No results for input(s): INR, PROTIME in the last 168 hours. Cardiac Enzymes: No results for input(s): CKTOTAL, CKMB, CKMBINDEX, TROPONINI in the last 168 hours. BNP (last 3 results) No results for input(s): PROBNP in the last 8760 hours. HbA1C: No results for input(s): HGBA1C in the last 72 hours. CBG: Recent Labs  Lab 05/16/19 0203 05/16/19 0839 05/16/19 1108 05/16/19 1210 05/16/19 1640  GLUCAP 158* 111* 132* 148* 271*   Lipid Profile: No results for input(s): CHOL, HDL, LDLCALC, TRIG, CHOLHDL, LDLDIRECT in the last 72 hours. Thyroid Function Tests: No results for input(s): TSH, T4TOTAL, FREET4, T3FREE, THYROIDAB in the last 72 hours. Anemia Panel: No results for input(s): VITAMINB12, FOLATE, FERRITIN, TIBC, IRON, RETICCTPCT in the last 72 hours.  Sepsis Labs: Recent Labs  Lab 05/15/19 1201 05/15/19 1615  LATICACIDVEN 2.3* 1.4    Recent Results (from the past 240 hour(s))  SARS CORONAVIRUS 2 (TAT 6-24 HRS) Nasopharyngeal Nasopharyngeal Swab     Status: None   Collection Time: 05/15/19  3:41 PM   Specimen: Nasopharyngeal Swab  Result Value Ref Range Status   SARS Coronavirus 2 NEGATIVE NEGATIVE Final    Comment: (NOTE) SARS-CoV-2 target nucleic acids are NOT DETECTED. The SARS-CoV-2 RNA is generally detectable in upper and lower respiratory specimens during the acute phase of infection. Negative results do not preclude SARS-CoV-2 infection, do not rule out co-infections with other pathogens, and should not be used as the sole basis for treatment or other patient management decisions. Negative results must be combined with clinical observations, patient history, and epidemiological information. The expected result is Negative. Fact Sheet for Patients: HairSlick.nohttps://www.fda.gov/media/138098/download Fact Sheet for Healthcare Providers: quierodirigir.comhttps://www.fda.gov/media/138095/download This test is not yet approved or cleared by the [place] FDA and  has been authorized for detection and/or diagnosis of SARS-CoV-2 by FDA under an Emergency Use Authorization (EUA). This EUA will remain  in effect (meaning this test can be used) for the duration of the COVID-19 declaration  under Section 56 4(b)(1) of the Act, 21 U.S.C. section 360bbb-3(b)(1), unless the authorization is terminated or revoked sooner. Performed at [facility], [address], [place], [place] [address]   Blood culture (routine x 2)     Status: None (Preliminary result)   Collection Time: 05/15/19  4:00 PM   Specimen: BLOOD RIGHT FOREARM  Result Value Ref Range Status   Specimen Description BLOOD RIGHT FOREARM  Final   Special Requests   Final    AEROBIC BOTTLE ONLY Blood Culture results may not be optimal due to an inadequate volume of blood received in  culture bottles   Culture   Final    NO GROWTH < 24 HOURS Performed at [facility], [address], [place], [place] [address]    Report Status PENDING  Incomplete  Blood culture (routine x 2)     Status: None (Preliminary result)   Collection Time: 05/15/19  4:02 PM   Specimen: BLOOD LEFT FOREARM  Result Value Ref Range Status   Specimen Description BLOOD LEFT FOREARM  Final   Special Requests   Final    BOTTLES DRAWN AEROBIC AND ANAEROBIC Blood Culture results may not be optimal due to an inadequate volume of blood received in culture bottles   Culture   Final    NO GROWTH < 24 HOURS Performed at [facility], [address], [place], [place] [address]    Report Status PENDING  Incomplete  Surgical pcr screen     Status: Abnormal   Collection Time: 05/16/19  8:43 AM   Specimen: Nasal Mucosa; Nasal Swab  Result Value Ref Range Status   MRSA, PCR NEGATIVE NEGATIVE Final   Staphylococcus aureus POSITIVE (A) NEGATIVE Final    Comment: (NOTE) The Xpert SA Assay (FDA approved for NASAL specimens in patients 62 years of age and older), is one component of a comprehensive surveillance program. It is not intended to diagnose infection nor to guide or monitor treatment. Performed at [facility], [address], [place], [place] [address]          Radiology Studies: Dg Foot Complete Right  Result Date: 05/15/2019 CLINICAL DATA:  Right first toe infection. EXAM: RIGHT FOOT COMPLETE - 3+ VIEW COMPARISON:  March 11, 2019. FINDINGS: There is no evidence of fracture or dislocation. There is no evidence of arthropathy or other focal bone abnormality. No lytic destruction is seen to suggest osteomyelitis. Large ulceration is seen in plantar soft tissues of the first distal phalanx. IMPRESSION: Large soft tissue ulceration is seen in plantar tissues of first toe. No lytic destruction is seen to suggest osteomyelitis. Electronically Signed   By: Lupita Raider M.D.    On: 05/15/2019 12:36        Scheduled Meds: . amLODipine  10 mg Oral Daily  . chlorhexidine  60 mL Topical Once  . docusate sodium  100 mg Oral BID  . enoxaparin (LOVENOX) injection  40 mg Subcutaneous Q24H  . feeding supplement  296 mL Oral Once  . gabapentin  100 mg Oral TID  . insulin aspart  0-15 Units Subcutaneous TID WC  . insulin aspart  0-5 Units Subcutaneous QHS  . insulin glargine  20 Units Subcutaneous Daily  . lisinopril  40 mg Oral Daily  . povidone-iodine  2 application Topical Once   Continuous Infusions: . sodium chloride 75 mL/hr at 05/16/19 1204  . lactated ringers 10 mL/hr at 05/16/19 0923  . piperacillin-tazobactam (  ZOSYN)  IV 3.375 g (05/16/19 1355)  . vancomycin Stopped (05/16/19 0735)     LOS: 1 day    Time spent: 30 minutes    Dorcas Carrow, MD [facility] Pager [phone]

## 2019-05-16 NOTE — Transfer of Care (Signed)
Immediate Anesthesia Transfer of Care Note  Patient: Kohle Winner  Procedure(s) Performed: AMPUTATION TOE, right great (Right Toe)  Patient Location: PACU  Anesthesia Type:General  Level of Consciousness: awake, alert , oriented and patient cooperative  Airway & Oxygen Therapy: Patient Spontanous Breathing and Patient connected to nasal cannula oxygen  Post-op Assessment: Report given to RN and Post -op Vital signs reviewed and stable  Post vital signs: Reviewed and stable  Last Vitals:  Vitals Value Taken Time  BP    Temp    Pulse 76 05/16/19 1109  Resp 12 05/16/19 1109  SpO2 100 % 05/16/19 1109  Vitals shown include unvalidated device data.  Last Pain:  Vitals:   05/16/19 0921  TempSrc:   PainSc: 7       Patients Stated Pain Goal: 3 (36/64/40 3474)  Complications: No apparent anesthesia complications

## 2019-05-16 NOTE — Consult Note (Signed)
ORTHOPAEDIC CONSULTATION  REQUESTING PHYSICIAN: Barb Merino, MD  Chief Complaint: Osteomyelitis and ulceration right great toe.  HPI: Chris Smith is a 43 y.o. male who presents with diabetic insensate neuropathy with a chronic ulcer and osteomyelitis of the right great toe.  Patient has cellulitis as well as venous insufficiency.  Patient presents at this time wishing to proceed with an amputation of the right great toe.  Past Medical History:  Diagnosis Date  . Acute respiratory failure ([place]) 09/2014  . Amnesia   . Aspiration pneumonia ([place]) 09/2014  . Chronic back pain   . Diabetes mellitus without complication ([place])   . Encephalopathy 09/2014  . ETOH abuse   . Hypertension   . Ischemic hepatitis unknown  . Rhabdomyolysis unknown  . Sciatica    Past Surgical History:  Procedure Laterality Date  . APPENDECTOMY    . BACK SURGERY  2002   Lumbar  . IR PATIENT EVAL TECH 0-60 MINS  05/12/2019   Social History   Socioeconomic History  . Marital status: Single    Spouse name: Not on file  . Number of children: 2  . Years of education: Not on file  . Highest education level: Not on file  Occupational History  . Occupation: Disability  Social Needs  . Financial resource strain: Not on file  . Food insecurity    Worry: Not on file    Inability: Not on file  . Transportation needs    Medical: Not on file    Non-medical: Not on file  Tobacco Use  . Smoking status: Current Every Day Smoker    Packs/day: 0.50    Years: 18.00    Pack years: 9.00    Types: Cigarettes  . Smokeless tobacco: Never Used  Substance and Sexual Activity  . Alcohol use: Yes    Alcohol/week: 0.0 standard drinks    Comment: 3 OR 4 DAYS PER WEEK  . Drug use: No  . Sexual activity: Not on file  Lifestyle  . Physical activity    Days per week: Not on file    Minutes per session: Not on file  . Stress: Not on file  Relationships  . Social Herbalist on phone: Not on file    Gets  together: Not on file    Attends religious service: Not on file    Active member of club or organization: Not on file    Attends meetings of clubs or organizations: Not on file    Relationship status: Not on file  Other Topics Concern  . Not on file  Social History Narrative  . Not on file   Family History  Problem Relation Age of Onset  . Cancer Mother        cause of death, ovarian & colon  . Diabetes Father   . Hypertension Father   . Gout Father   . Hyperlipidemia Father    - negative except otherwise stated in the family history section No Known Allergies Prior to Admission medications   Medication Sig Start Date End Date Taking? Authorizing Provider  amLODipine (NORVASC) 10 MG tablet Take 1 tablet (10 mg total) by mouth daily. 04/16/19 05/16/19 Yes Dahal, Marlowe Aschoff, MD  doxycycline (VIBRA-TABS) 100 MG tablet Take 1 tablet (100 mg total) by mouth every 12 (twelve) hours. 04/15/19 05/27/19 Yes Dahal, Marlowe Aschoff, MD  gabapentin (NEURONTIN) 100 MG capsule Take 1 capsule (100 mg total) by mouth 3 (three) times daily. 04/15/19 05/15/19 Yes Dahal, Marlowe Aschoff, MD  glipiZIDE (GLUCOTROL) 5 MG tablet Take 0.5 tablets (2.5 mg total) by mouth daily before breakfast. 04/16/19 05/16/19 Yes Dahal, Marlowe Aschoff, MD  ibuprofen (ADVIL) 200 MG tablet Take 800 mg by mouth every 8 (eight) hours as needed for moderate pain.   Yes [provider]  Insulin Glargine (BASAGLAR KWIKPEN) 100 UNIT/ML SOPN Inject 0.2 mLs (20 Units total) into the skin daily. 04/15/19 05/15/19 Yes Dahal, Marlowe Aschoff, MD  lisinopril (ZESTRIL) 40 MG tablet Take 1 tablet (40 mg total) by mouth daily. 04/16/19 05/16/19 Yes Dahal, Marlowe Aschoff, MD  metFORMIN (GLUCOPHAGE) 500 MG tablet Take 1 tablet (500 mg total) by mouth 2 (two) times daily with a meal. 04/15/19 05/15/19 Yes Dahal, Marlowe Aschoff, MD  glucose monitoring kit (FREESTYLE) monitoring kit 1 each by Does not apply route 4 (four) times daily - after meals and at bedtime. 1 month Diabetic Testing  Supplies for QAC-QHS accuchecks. 04/17/19   Terrilee Croak, MD  Insulin Pen Needle (PEN NEEDLES) 31G X 8 MM MISC Use as directed 04/16/19   Terrilee Croak, MD   Dg Foot Complete Right  Result Date: 05/15/2019 CLINICAL DATA:  Right first toe infection. EXAM: RIGHT FOOT COMPLETE - 3+ VIEW COMPARISON:  March 11, 2019. FINDINGS: There is no evidence of fracture or dislocation. There is no evidence of arthropathy or other focal bone abnormality. No lytic destruction is seen to suggest osteomyelitis. Large ulceration is seen in plantar soft tissues of the first distal phalanx. IMPRESSION: Large soft tissue ulceration is seen in plantar tissues of first toe. No lytic destruction is seen to suggest osteomyelitis. Electronically Signed   By: Marijo Conception M.D.   On: 05/15/2019 12:36   - pertinent xrays, CT, MRI studies were reviewed and independently interpreted  Positive ROS: All other systems have been reviewed and were otherwise negative with the exception of those mentioned in the HPI and as above.  Physical Exam: General: Alert, no acute distress Psychiatric: Patient is competent for consent with normal mood and affect Lymphatic: No axillary or cervical lymphadenopathy Cardiovascular: No pedal edema Respiratory: No cyanosis, no use of accessory musculature GI: No organomegaly, abdomen is soft and non-tender    Images:  '@ENCIMAGES' @  Labs:  Lab Results  Component Value Date   HGBA1C 8.8 (H) 03/12/2019   HGBA1C 11.0 (H) 04/28/2016   HGBA1C 10.10 10/13/2014   ESRSEDRATE 7 04/29/2019   ESRSEDRATE 2 04/15/2019   CRP <0.8 04/29/2019   CRP 1.2 (H) 04/15/2019   REPTSTATUS 04/13/2019 FINAL 04/11/2019   GRAMSTAIN  04/11/2019    RARE WBC PRESENT, PREDOMINANTLY PMN FEW GRAM POSITIVE COCCI RARE GRAM POSITIVE RODS    CULT  04/11/2019    NORMAL SKIN FLORA Performed at [facility], [place] [address], [place], [place] [address]     Lab Results  Component Value Date   ALBUMIN 4.0  05/15/2019   ALBUMIN 3.5 04/11/2019   ALBUMIN 3.6 03/11/2019    Neurologic: Patient does not have protective sensation bilateral lower extremities.   MUSCULOSKELETAL:   Skin: Examination patient has cellulitis of the great toe there is dermatitis of the leg with pitting edema of the right leg.  There is a large Wagner grade 3 ulcer which probes to bone of the great toe.  Patient has a good dorsalis pedis and posterior tibial pulse.  Assessment: Assessment: Diabetic insensate neuropathy osteomyelitis ulceration of the right great toe as well as venous insufficiency.  Plan: Plan: We will plan for right great toe amputation.  Patient should be  safe for discharge to home once he is safe with ambulation ideally nonweightbearing on the right lower extremity but weightbearing as needed on the heel is acceptable.  Risks and benefits were discussed including risk of the wound not healing with weightbearing.  Thank you for the consult and the opportunity to see Mr. Theoren Palka, [place] [phone] 7:39 AM

## 2019-05-16 NOTE — Anesthesia Preprocedure Evaluation (Signed)
Anesthesia Evaluation  Patient identified by MRN, date of birth, ID band Patient awake    Reviewed: Allergy & Precautions, NPO status , Patient's Chart, lab work & pertinent test results  Airway Mallampati: II  TM Distance: >3 FB Neck ROM: Full    Dental no notable dental hx. (+) Teeth Intact   Pulmonary pneumonia, Current Smoker,    Pulmonary exam normal breath sounds clear to auscultation       Cardiovascular hypertension, Pt. on medications Normal cardiovascular exam Rhythm:Regular Rate:Normal     Neuro/Psych Depression  Neuromuscular disease    GI/Hepatic negative GI ROS, (+)     substance abuse  , Hepatitis -  Endo/Other  diabetes  Renal/GU      Musculoskeletal negative musculoskeletal ROS (+)   Abdominal   Peds  Hematology negative hematology ROS (+)   Anesthesia Other Findings   Reproductive/Obstetrics                             Anesthesia Physical Anesthesia Plan  ASA: III  Anesthesia Plan: General   Post-op Pain Management:    Induction: Intravenous  PONV Risk Score and Plan: 2 and Treatment may vary due to age or medical condition and Dexamethasone  Airway Management Planned: LMA  Additional Equipment:   Intra-op Plan:   Post-operative Plan:   Informed Consent:     Dental advisory given  Plan Discussed with: CRNA  Anesthesia Plan Comments:         Anesthesia Quick Evaluation

## 2019-05-16 NOTE — Progress Notes (Signed)
Orthopedic Tech Progress Note Patient Details:  Majour Frei 14-Nov-1975 [id number]  Ortho Devices Type of Ortho Device: Postop shoe/boot Ortho Device/Splint Location: RLE Ortho Device/Splint Interventions: Ordered, Application   Post Interventions Patient Tolerated: Well Instructions Provided: Care of device   Braulio Bosch 05/16/2019, 12:32 PM

## 2019-05-16 NOTE — Op Note (Signed)
05/16/2019  11:11 AM  PATIENT:  Chris Smith    PRE-OPERATIVE DIAGNOSIS:  osteomyelitits, right great toe  POST-OPERATIVE DIAGNOSIS:  Same  PROCEDURE:  AMPUTATION TOE, right great  SURGEON:  Newt Minion, MD  PHYSICIAN ASSISTANT:None ANESTHESIA:   General  PREOPERATIVE INDICATIONS:  Adreyan Carbajal is a  43 y.o. male with a diagnosis of osteomyelitits, right great toe who failed conservative measures and elected for surgical management.    The risks benefits and alternatives were discussed with the patient preoperatively including but not limited to the risks of infection, bleeding, nerve injury, cardiopulmonary complications, the need for revision surgery, among others, and the patient was willing to proceed.  OPERATIVE IMPLANTS: None  @ENCIMAGES @  OPERATIVE FINDINGS: No abscess at the level of infection good petechial bleeding.  OPERATIVE PROCEDURE: Patient was brought the operating room underwent a general anesthetic.  After adequate level of anesthesia were obtained patient's right lower extremity was prepped using DuraPrep draped into a sterile field a timeout was called.  A fishmouth incision was made just distal to the MTP joint.  This was carried down to the joint and the toe was amputated through the MTP joint.  Electrocautery was used for hemostasis.  The wound was irrigated with normal saline.  The incision was closed using 2-0 nylon.  A sterile dressing was applied patient was extubated taken to PACU in stable condition.   DISCHARGE PLANNING:  Antibiotic duration: Preoperative antibiotics  Weightbearing: Ideally nonweightbearing on the right weightbearing as needed on the heel is okay  Pain medication: Opioid pathway  Dressing care/ Wound VAC: Change dressing as needed if it becomes soiled  Ambulatory devices: Walker  Discharge to: Home. After 23 hour observation  Follow-up: In the office 1 week post operative.

## 2019-05-16 NOTE — Anesthesia Postprocedure Evaluation (Signed)
Anesthesia Post Note  Patient: Chris Smith  Procedure(s) Performed: AMPUTATION TOE, right great (Right Toe)     Patient location during evaluation: PACU Anesthesia Type: General Level of consciousness: awake and alert Pain management: pain level controlled Vital Signs Assessment: post-procedure vital signs reviewed and stable Respiratory status: spontaneous breathing, nonlabored ventilation, respiratory function stable and patient connected to nasal cannula oxygen Cardiovascular status: blood pressure returned to baseline and stable Postop Assessment: no apparent nausea or vomiting Anesthetic complications: no    Last Vitals:  Vitals:   05/16/19 1130 05/16/19 1151  BP: 114/84 120/88  Pulse: 71 74  Resp: 17 16  Temp:  36.6 C  SpO2: 98% 95%    Last Pain:  Vitals:   05/16/19 1151  TempSrc: Oral  PainSc:                  Barnet Glasgow

## 2019-05-16 NOTE — ED Notes (Signed)
Consent for procedure signed by patient and witnessed.

## 2019-05-16 NOTE — Progress Notes (Signed)
NEW ADMISSION NOTE New Admission Note:   Arrival Method: Patient arrived in bed from ED Mental Orientation: Alert and oriented x 4. Telemetry:  N/A Assessment: Completed Skin: Dry and intact except right great diabetic toe going for amputation IV: L UA SL. Pain: 7/10, medication not time to give. Tubes:  N/A Safety Measures: Safety Fall Prevention Plan has been given, discussed and signed Admission: Completed [facility] Orientation: Patient has been orientated to the room, unit and staff.   Orders have been reviewed and implemented. Will continue to monitor the patient. Call light has been placed within reach and bed alarm has been activated.   Amaryllis Dyke, RN

## 2019-05-17 ENCOUNTER — Encounter (HOSPITAL_COMMUNITY): Payer: Self-pay | Admitting: Orthopedic Surgery

## 2019-05-17 LAB — BASIC METABOLIC PANEL
Anion gap: 11 (ref 5–15)
BUN: 10 mg/dL (ref 6–20)
CO2: 24 mmol/L (ref 22–32)
Calcium: 8.6 mg/dL — ABNORMAL LOW (ref 8.9–10.3)
Chloride: 100 mmol/L (ref 98–111)
Creatinine, Ser: 0.75 mg/dL (ref 0.61–1.24)
GFR calc Af Amer: >60 mL/min (ref >60)
GFR calc non Af Amer: >60 mL/min (ref >60)
Glucose, Bld: 210 mg/dL — ABNORMAL HIGH (ref 70–99)
Potassium: 3.8 mmol/L (ref 3.5–5.1)
Sodium: 135 mmol/L (ref 135–145)

## 2019-05-17 LAB — MAGNESIUM: Magnesium: 1.8 mg/dL (ref 1.7–2.4)

## 2019-05-17 LAB — CBC WITH DIFFERENTIAL/PLATELET
Abs Immature Granulocytes: 0.06 10*3/uL (ref 0.00–0.07)
Basophils Absolute: 0 10*3/uL (ref 0.0–0.1)
Basophils Relative: 0 %
Eosinophils Absolute: 0 10*3/uL (ref 0.0–0.5)
Eosinophils Relative: 0 %
HCT: 41.7 % (ref 39.0–52.0)
Hemoglobin: 13.8 g/dL (ref 13.0–17.0)
Immature Granulocytes: 1 %
Lymphocytes Relative: 12 %
Lymphs Abs: 1.3 10*3/uL (ref 0.7–4.0)
MCH: 29.4 pg (ref 26.0–34.0)
MCHC: 33.1 g/dL (ref 30.0–36.0)
MCV: 88.7 fL (ref 80.0–100.0)
Monocytes Absolute: 0.7 10*3/uL (ref 0.1–1.0)
Monocytes Relative: 6 %
Neutro Abs: 8.5 10*3/uL — ABNORMAL HIGH (ref 1.7–7.7)
Neutrophils Relative %: 81 %
Platelets: 221 10*3/uL (ref 150–400)
RBC: 4.7 MIL/uL (ref 4.22–5.81)
RDW: 12.7 % (ref 11.5–15.5)
WBC: 10.6 10*3/uL — ABNORMAL HIGH (ref 4.0–10.5)
nRBC: 0 % (ref 0.0–0.2)

## 2019-05-17 LAB — GLUCOSE, CAPILLARY: Glucose-Capillary: 187 mg/dL — ABNORMAL HIGH (ref 70–99)

## 2019-05-17 LAB — PHOSPHORUS: Phosphorus: 2.9 mg/dL (ref 2.5–4.6)

## 2019-05-17 MED ORDER — BASAGLAR KWIKPEN 100 UNIT/ML ~~LOC~~ SOPN: 20.0000 [IU] | PEN_INJECTOR | Freq: Every day | SUBCUTANEOUS | 0 refills | Status: AC

## 2019-05-17 MED ORDER — DOXYCYCLINE HYCLATE 100 MG PO TABS: 100.0000 mg | ORAL_TABLET | Freq: Two times a day (BID) | ORAL | 0 refills | Status: AC

## 2019-05-17 MED ORDER — OXYCODONE HCL 5 MG PO TABS: 5.0000 mg | ORAL_TABLET | Freq: Three times a day (TID) | ORAL | 0 refills | Status: AC | PRN

## 2019-05-17 MED ORDER — BASAGLAR KWIKPEN 100 UNIT/ML ~~LOC~~ SOPN: 20.0000 [IU] | PEN_INJECTOR | Freq: Every day | SUBCUTANEOUS | 0 refills | Status: DC

## 2019-05-17 MED ORDER — DOXYCYCLINE HYCLATE 100 MG PO TABS: 100.0000 mg | ORAL_TABLET | Freq: Two times a day (BID) | ORAL | 0 refills | Status: DC

## 2019-05-17 NOTE — Progress Notes (Signed)
Patient ID: Chris Smith, male   DOB: 11-Jul-1975, 43 y.o.   MRN: [id number] Postoperative day 1 amputation right great toe at the MTP joint.  There is a small amount of dried blood on the plantar aspect of the dressing.  Discussed the importance of strict nonweightbearing.  From an orthopedic standpoint patient can discharge to home when he is safe with ambulation.  Will discharge on 2 weeks of oral antibiotics, doxycycline or other.

## 2019-05-17 NOTE — Progress Notes (Addendum)
Pt needs assistance with his prescriptions. Met with pt. He plans to return home with his roommates. He reports that he won't have medical insurance until next year. He reports that he was discharged from the hospital a couple of weeks ago and we provided a bag of medicines. He reports that he doesn't have any insulin. He doesn't have a PCP. Assisted pt with his meds through the [facility] program. Provided pt with a a copy of the [facility] and encouraged him to contact the center on Monday and schedule an appointment. Explained to him the importance of having a PCP and f/u with a physician after being D/C from the hospital. He verbalized understanding.

## 2019-05-17 NOTE — Evaluation (Signed)
Physical Therapy Evaluation Patient Details Name: Chris Smith MRN: [id number] DOB: January 28, 1976 Today's Date: 05/17/2019   History of Present Illness  Patient is a 43 year old male with persistent wound on R great toe, now s/p R great toe amputation 05/16/19. PMH to include: HTN, hypokalemia  Clinical Impression  Patient received in bed, RN present. Patient educated regarding NWB status on R LE. He requires supervision for bed mobility and transfers. Ambulated with RW and min guard assist with cues for safety x125 feet. Cued to rest when needed and place heel on floor for balance only when resting as needed. Patient compliant with NWB status. He will need a RW to go home with. Patient will continue to benefit from skilled PT while here to improve safety with AD and mobility maintaining NWB status.      Follow Up Recommendations No PT follow up    Equipment Recommendations  Rolling walker with 5" wheels    Recommendations for Other Services       Precautions / Restrictions Precautions Precautions: None Precaution Comments: low fall Restrictions Weight Bearing Restrictions: Yes RLE Weight Bearing: Non weight bearing      Mobility  Bed Mobility Overal bed mobility: Independent                Transfers Overall transfer level: Needs assistance Equipment used: Rolling walker (2 wheeled) Transfers: Sit to/from Stand Sit to Stand: Supervision            Ambulation/Gait Ambulation/Gait assistance: Min guard Gait Distance (Feet): 125 Feet Assistive device: Rolling walker (2 wheeled) Gait Pattern/deviations: Step-to pattern Gait velocity: decreased   General Gait Details: cues to improve safety with use of RW and NWB status. Patient reports no one told him he was non-WBing on Right. He had been walking on right foot since surgery.  Stairs            Wheelchair Mobility    Modified Rankin (Stroke Patients Only)       Balance Overall balance assessment:  Needs assistance Sitting-balance support: Feet supported Sitting balance-Leahy Scale: Normal     Standing balance support: Bilateral upper extremity supported;During functional activity Standing balance-Leahy Scale: Fair Standing balance comment: good static standing balance, fair dynamic balance                             Pertinent Vitals/Pain Pain Assessment: 0-10 Pain Score: 8  Pain Location: R great toe Pain Descriptors / Indicators: Aching;Operative site guarding;Sore Pain Intervention(s): Limited activity within patient's tolerance;Monitored during session;Repositioned    Home Living Family/patient expects to be discharged to:: Private residence Living Arrangements: Non-relatives/Friends Available Help at Discharge: Available PRN/intermittently;Friend(s) Type of Home: House Home Access: Stairs to enter Entrance Stairs-Rails: Right Entrance Stairs-Number of Steps: 3 Home Layout: Multi-level;Laundry or work area in Risk analyst: None      Prior Function Level of Independence: Independent               Higher education careers adviser        Extremity/Trunk Assessment   Upper Extremity Assessment Upper Extremity Assessment: Overall WFL for tasks assessed    Lower Extremity Assessment Lower Extremity Assessment: Overall WFL for tasks assessed    Cervical / Trunk Assessment Cervical / Trunk Assessment: Normal  Communication   Communication: No difficulties  Cognition Arousal/Alertness: Awake/alert Behavior During Therapy: WFL for tasks assessed/performed Overall Cognitive Status: Within Functional Limits for tasks assessed  General Comments      Exercises     Assessment/Plan    PT Assessment Patient needs continued PT services  PT Problem List Decreased mobility;Decreased activity tolerance;Decreased knowledge of precautions;Decreased balance;Pain;Decreased safety  awareness;Decreased knowledge of use of DME       PT Treatment Interventions DME instruction;Therapeutic activities;Gait training;Stair training;Functional mobility training;Balance training;Patient/family education    PT Goals (Current goals can be found in the Care Plan section)  Acute Rehab PT Goals Patient Stated Goal: to return home PT Goal Formulation: With patient Time For Goal Achievement: 05/24/19 Potential to Achieve Goals: Good    Frequency Min 2X/week   Barriers to discharge Decreased caregiver support has split level home. bed/bath up 3-4 steps    Co-evaluation               AM-PAC PT "6 Clicks" Mobility  Outcome Measure Help needed turning from your back to your side while in a flat bed without using bedrails?: None Help needed moving from lying on your back to sitting on the side of a flat bed without using bedrails?: None Help needed moving to and from a bed to a chair (including a wheelchair)?: A Little Help needed standing up from a chair using your arms (e.g., wheelchair or bedside chair)?: A Little Help needed to walk in hospital room?: A Little Help needed climbing 3-5 steps with a railing? : A Little 6 Click Score: 20    End of Session Equipment Utilized During Treatment: Gait belt Activity Tolerance: Patient tolerated treatment well;Patient limited by pain;Patient limited by fatigue Patient left: in bed;with call bell/phone within reach Nurse Communication: Mobility status PT Visit Diagnosis: Unsteadiness on feet (R26.81);Difficulty in walking, not elsewhere classified (R26.2);Pain Pain - Right/Left: Right Pain - part of body: Ankle and joints of foot    Time: 4098-1191 PT Time Calculation (min) (ACUTE ONLY): 23 min   Charges:   PT Evaluation $PT Eval Moderate Complexity: 1 Mod PT Treatments $Gait Training: 8-22 mins        Tymere Depuy, PT, GCS 05/17/19,10:15 AM

## 2019-05-17 NOTE — Progress Notes (Deleted)
DISCHARGE NOTE SNF Chris Smith to be discharged Home per MD order. Patient verbalized understanding.  Skin clean, dry and intact without evidence of skin break down, no evidence of skin tears noted. IV catheter discontinued intact. Site without signs and symptoms of complications. Dressing and pressure applied. Pt denies pain at the site currently. No complaints noted.  Patient free of lines, drains, and wounds.   Discharge packet assembled. An After Visit Summary (AVS) was printed and given to the EMS personnel. Patient escorted via stretcher and discharged to [facility] via ambulance. Report called to accepting facility; all questions and concerns addressed.   Aneta Mins BSN, RN3

## 2019-05-17 NOTE — Progress Notes (Signed)
Pt needs a RW. Pt is ready to be D/C and he doesn't want to wait for the walker.  Contacted Keon at [employer] for DME. He reports that he is at [facility] and he will be here around 4. Pt  didn't want to wait for the walker and he left. Per RN, pt may return to the hospital today or tomorrow to pick up the walker. Contacted Keon and he reports that he can meet with the pt tomorrow at the nurses station. RN to f/u with the pt to arrange time to pick up the DME.

## 2019-05-17 NOTE — Evaluation (Signed)
Occupational Therapy Evaluation and Discharge  Patient Details Name: Chris Smith MRN: [id number] DOB: [birth year] Today's Date: 05/17/2019    History of Present Illness Patient is a 43 year old male with persistent wound on R great toe, now s/p R great toe amputation 05/16/19. PMH to include: HTN, hypokalemia   Clinical Impression   PTA patient independent. Admitted for above and limited by problem list below, including pain in R great toe.  Patient educated on safety, NWB precautions to R LE, ADL compensatory techniques.  Pt demonstrates ability to complete LB dressing, simulated toilet transfers with modified independence and simulated tub transfers using RW with min guard to supervision for safety.  Pt impulsive at times with RW, cueing for pacing and safety. Patient verbalizes understanding with recommendations and safety, with plans to dc home today. No further OT needs have been identified. OT wills sign off.     Follow Up Recommendations  No OT follow up    Equipment Recommendations  None recommended by OT    Recommendations for Other Services       Precautions / Restrictions Precautions Precautions: None Precaution Comments: low fall Restrictions Weight Bearing Restrictions: Yes RLE Weight Bearing: Non weight bearing      Mobility Bed Mobility Overal bed mobility: Independent                Transfers Overall transfer level: Needs assistance Equipment used: Rolling walker (2 wheeled) Transfers: Sit to/from Stand Sit to Stand: Modified independent (Device/Increase time)              Balance Overall balance assessment: Needs assistance Sitting-balance support: No upper extremity supported;Feet supported Sitting balance-Leahy Scale: Normal     Standing balance support: Bilateral upper extremity supported;During functional activity Standing balance-Leahy Scale: Fair Standing balance comment: preference to BUE support dynamically                            ADL either performed or assessed with clinical judgement   ADL Overall ADL's : Modified independent                                     Functional mobility during ADLs: Modified independent;Rolling walker General ADL Comments: pt modified independent level for ADLs, transfers; simulated tub transfers and reviewed techniques for safety; educated on LB compensatory techniques     Vision   Vision Assessment?: No apparent visual deficits     Perception     Praxis      Pertinent Vitals/Pain Pain Assessment: 0-10 Pain Score: 8  Pain Location: R great toe Pain Descriptors / Indicators: Throbbing Pain Intervention(s): Limited activity within patient's tolerance;Monitored during session;Repositioned     Hand Dominance     Extremity/Trunk Assessment Upper Extremity Assessment Upper Extremity Assessment: Overall WFL for tasks assessed   Lower Extremity Assessment Lower Extremity Assessment: Defer to PT evaluation   Cervical / Trunk Assessment Cervical / Trunk Assessment: Normal   Communication Communication Communication: No difficulties   Cognition Arousal/Alertness: Awake/alert Behavior During Therapy: WFL for tasks assessed/performed Overall Cognitive Status: Within Functional Limits for tasks assessed                                     General Comments       Exercises  Shoulder Instructions      Home Living Family/patient expects to be discharged to:: Private residence Living Arrangements: Non-relatives/Friends Available Help at Discharge: Available PRN/intermittently;Friend(s) Type of Home: House Home Access: Stairs to enter [employer] of Steps: 3 Entrance Stairs-Rails: Right Home Layout: Multi-level;Laundry or work area in basement;Bed/bath upstairs Alternate Therapist, sports of Steps: 3 Alternate Level Stairs-Rails: Right Bathroom Shower/Tub: Teacher, early years/pre: Standard      Home Equipment: Grab bars - tub/shower          Prior Functioning/Environment Level of Independence: Independent                 OT Problem List: Pain;Decreased knowledge of use of DME or AE      OT Treatment/Interventions:      OT Goals(Current goals can be found in the care plan section) Acute Rehab OT Goals Patient Stated Goal: to return home OT Goal Formulation: With patient  OT Frequency:     Barriers to D/C:            Co-evaluation              AM-PAC OT "6 Clicks" Daily Activity     Outcome Measure Help from another person eating meals?: None Help from another person taking care of personal grooming?: None Help from another person toileting, which includes using toliet, bedpan, or urinal?: None Help from another person bathing (including washing, rinsing, drying)?: None Help from another person to put on and taking off regular upper body clothing?: None Help from another person to put on and taking off regular lower body clothing?: None 6 Click Score: 24   End of Session Equipment Utilized During Treatment: Rolling walker Nurse Communication: Mobility status  Activity Tolerance: Patient tolerated treatment well Patient left: in bed;with call bell/phone within reach  OT Visit Diagnosis: Unsteadiness on feet (R26.81);Pain Pain - Right/Left: Right Pain - part of body: Ankle and joints of foot                Time: 1340-1356 OT Time Calculation (min): 16 min Charges:  OT General Charges $OT Visit: 1 Visit OT Evaluation $OT Eval Low Complexity: [place], OT Acute Rehabilitation Services Pager [phone] Office [phone]   Delight Stare 05/17/2019, 2:04 PM

## 2019-05-17 NOTE — Progress Notes (Signed)
DISCHARGE NOTE HOME Koben Daman to be discharged Home per MD order. Discussed prescriptions and follow up appointments with the patient. Prescriptions given to patient; medication list explained in detail. Patient verbalized understanding.  Skin clean, dry and intact without evidence of skin break down, no evidence of skin tears noted. IV catheter discontinued intact. Site without signs and symptoms of complications. Dressing and pressure applied. Pt denies pain at the site currently. No complaints noted.  Patient free of lines, drains, and wounds.   An After Visit Summary (AVS) was printed and given to the patient. Patient escorted via wheelchair, and discharged home via private auto.  Aneta Mins BSN, RN3

## 2019-05-17 NOTE — Plan of Care (Signed)
  Problem: Education: Goal: Knowledge of General Education information will improve Description Including pain rating scale, medication(s)/side effects and non-pharmacologic comfort measures Outcome: Progressing   

## 2019-05-17 NOTE — Discharge Summary (Signed)
Physician Discharge Summary  General Wearing NWG:[id number] DOB: 07-Dec-1975 DOA: 05/15/2019  PCP: Patient, No Pcp Per  Admit date: 05/15/2019 Discharge date: 05/17/2019  Admitted From: Home Disposition: Home  Recommendations for Outpatient Follow-up:  1. Follow up with PCP in 1-2 weeks 2. Follow-up with orthopedic surgery as a scheduled  Home Health: Not applicable Equipment/Devices: Not applicable  Discharge Condition: Stable CODE STATUS: Full code Diet recommendation: Low-carb diet  Discharge summary: 43 year old gentleman with type 2 diabetes poorly controlled, recently developing right great toe osteomyelitis and diabetic foot ulcer, nonhealing, partially treated, previously reluctant for surgical removal came back to the hospital with ongoing pain, swelling, drainage and appearance of new ulcer.  Treated with broad-spectrum antibiotics, underwent great toe amputation with clean surgical margins.  Clinically stable.  As per orthopedic plan, going home with 2 weeks of oral doxycycline, dressing intact to follow-up with surgery.  Medications were refilled.  Encouraged follow-up with community clinic.  Discharge Diagnoses:  Principal Problem:   Type 2 diabetes mellitus with diabetic foot infection ([place]) Active Problems:   Hypertension   Hypokalemia    Discharge Instructions  Discharge Instructions    Call MD for:  redness, tenderness, or signs of infection (pain, swelling, redness, odor or green/yellow discharge around incision site)   Complete by: As directed    Call MD for:  severe uncontrolled pain   Complete by: As directed    Call MD for:  temperature >100.4   Complete by: As directed    Diet Carb Modified   Complete by: As directed    Increase activity slowly   Complete by: As directed    Leave dressing on - Keep it clean, dry, and intact until clinic visit   Complete by: As directed      Allergies as of 05/17/2019   No Known Allergies     Medication List     TAKE these medications   amLODipine 10 MG tablet Commonly known as: NORVASC Take 1 tablet (10 mg total) by mouth daily.   Basaglar KwikPen 100 UNIT/ML Sopn Inject 0.2 mLs (20 Units total) into the skin daily.   doxycycline 100 MG tablet Commonly known as: VIBRA-TABS Take 1 tablet (100 mg total) by mouth every 12 (twelve) hours for 14 days.   gabapentin 100 MG capsule Commonly known as: NEURONTIN Take 1 capsule (100 mg total) by mouth 3 (three) times daily.   glipiZIDE 5 MG tablet Commonly known as: GLUCOTROL Take 0.5 tablets (2.5 mg total) by mouth daily before breakfast.   glucose monitoring kit monitoring kit 1 each by Does not apply route 4 (four) times daily - after meals and at bedtime. 1 month Diabetic Testing Supplies for QAC-QHS accuchecks.   ibuprofen 200 MG tablet Commonly known as: ADVIL Take 800 mg by mouth every 8 (eight) hours as needed for moderate pain.   lisinopril 40 MG tablet Commonly known as: ZESTRIL Take 1 tablet (40 mg total) by mouth daily.   metFORMIN 500 MG tablet Commonly known as: GLUCOPHAGE Take 1 tablet (500 mg total) by mouth 2 (two) times daily with a meal.   oxyCODONE 5 MG immediate release tablet Commonly known as: Oxy IR/ROXICODONE Take 1 tablet (5 mg total) by mouth every 8 (eight) hours as needed for up to 7 days for moderate pain.   Pen Needles 31G X 8 MM Misc Use as directed      Follow-up Information    Newt Minion, MD In 1 week.   Specialty: Orthopedic Surgery  Contact information: [place] [address] [phone]          No Known Allergies  Consultations:  Orthopedics   Procedures/Studies: Dg Foot Complete Right  Result Date: 05/15/2019 CLINICAL DATA:  Right first toe infection. EXAM: RIGHT FOOT COMPLETE - 3+ VIEW COMPARISON:  March 11, 2019. FINDINGS: There is no evidence of fracture or dislocation. There is no evidence of arthropathy or other focal bone abnormality. No lytic  destruction is seen to suggest osteomyelitis. Large ulceration is seen in plantar soft tissues of the first distal phalanx. IMPRESSION: Large soft tissue ulceration is seen in plantar tissues of first toe. No lytic destruction is seen to suggest osteomyelitis. Electronically Signed   By: Marijo Conception M.D.   On: 05/15/2019 12:36   Ir Patient Eval Tech 0-60 Mins  Result Date: 05/12/2019 Karenann Cai     05/12/2019  1:01 PM Patient came in for a PICC removal. Removed dressing and PICC came out intact measuring 43 cm.  Bandage placed and and educated patient. France Ravens RT R, VI Brandy Mullis RT R  Subjective: Patient seen and examined no overnight events afebrile.  Eager to go home.  Walking with heel.  Occasional shooting pain on the toe.   Discharge Exam: Vitals:   05/17/19 0526 05/17/19 1053  BP: (!) 135/94 124/88  Pulse: 75 77  Resp: 18 18  Temp: 98 F (36.7 C) 97.9 F (36.6 C)  SpO2: 98% 97%   Vitals:   05/16/19 1642 05/16/19 2057 05/17/19 0526 05/17/19 1053  BP: 124/79 106/71 (!) 135/94 124/88  Pulse: 79 75 75 77  Resp: _0 Temp: 98.1 F (36.7 C) 97.6 F (36.4 C) 98 F (36.7 C) 97.9 F (36.6 C)  TempSrc: Oral Oral Oral Oral  SpO2: 95% 93% 98% 97%  Weight:      Height:        General: Pt is alert, awake, not in acute distress Cardiovascular: RRR, S1/S2 +, no rubs, no gallops Respiratory: CTA bilaterally, no wheezing, no rhonchi Abdominal: Soft, NT, ND, bowel sounds + Extremities: no edema, no cyanosis Right toe with postop dressing, intact and dry not removed.    The results of significant diagnostics from this hospitalization (including imaging, microbiology, ancillary and laboratory) are listed below for reference.     Microbiology: Recent Results (from the past 240 hour(s))  SARS CORONAVIRUS 2 (TAT 6-24 HRS) Nasopharyngeal Nasopharyngeal Swab     Status: None   Collection Time: 05/15/19  3:41 PM   Specimen: Nasopharyngeal Swab  Result Value Ref  Range Status   SARS Coronavirus 2 NEGATIVE NEGATIVE Final    Comment: (NOTE) SARS-CoV-2 target nucleic acids are NOT DETECTED. The SARS-CoV-2 RNA is generally detectable in upper and lower respiratory specimens during the acute phase of infection. Negative results do not preclude SARS-CoV-2 infection, do not rule out co-infections with other pathogens, and should not be used as the sole basis for treatment or other patient management decisions. Negative results must be combined with clinical observations, patient history, and epidemiological information. The expected result is Negative. Fact Sheet for Patients: SugarRoll.be Fact Sheet for Healthcare Providers: https://www.woods-mathews.com/ This test is not yet approved or cleared by the [place] FDA and  has been authorized for detection and/or diagnosis of SARS-CoV-2 by FDA under an Emergency Use Authorization (EUA). This EUA will remain  in effect (meaning this test can be used) for the duration of the COVID-19 declaration under Section 56 4(b)(1)  of the Act, 21 U.S.C. section 360bbb-3(b)(1), unless the authorization is terminated or revoked sooner. Performed at [facility], [place] [address], [place], [place] [address]   Blood culture (routine x 2)     Status: None (Preliminary result)   Collection Time: 05/15/19  4:00 PM   Specimen: BLOOD RIGHT FOREARM  Result Value Ref Range Status   Specimen Description BLOOD RIGHT FOREARM  Final   Special Requests   Final    AEROBIC BOTTLE ONLY Blood Culture results may not be optimal due to an inadequate volume of blood received in culture bottles   Culture   Final    NO GROWTH < 24 HOURS Performed at [facility], [place] [address], [place], [place] [address]    Report Status PENDING  Incomplete  Blood culture (routine x 2)     Status: None (Preliminary result)   Collection Time: 05/15/19  4:02 PM   Specimen: BLOOD LEFT FOREARM   Result Value Ref Range Status   Specimen Description BLOOD LEFT FOREARM  Final   Special Requests   Final    BOTTLES DRAWN AEROBIC AND ANAEROBIC Blood Culture results may not be optimal due to an inadequate volume of blood received in culture bottles   Culture   Final    NO GROWTH < 24 HOURS Performed at [facility], [place] [address], [place], [place] [address]    Report Status PENDING  Incomplete  Surgical pcr screen     Status: Abnormal   Collection Time: 05/16/19  8:43 AM   Specimen: Nasal Mucosa; Nasal Swab  Result Value Ref Range Status   MRSA, PCR NEGATIVE NEGATIVE Final   Staphylococcus aureus POSITIVE (A) NEGATIVE Final    Comment: (NOTE) The Xpert SA Assay (FDA approved for NASAL specimens in patients 9 years of age and older), is one component of a comprehensive surveillance program. It is not intended to diagnose infection nor to guide or monitor treatment. Performed at [facility], [place] [address] [place],  [address]      Labs: BNP (last 3 results) No results for input(s): BNP in the last 8760 hours. Basic Metabolic Panel: Recent Labs  Lab 05/15/19 1201 05/17/19 0448  NA 135 135  K 3.1* 3.8  CL 96* 100  CO2 24 24  GLUCOSE 206* 210*  BUN 6 10  CREATININE 0.71 0.75  CALCIUM 9.4 8.6*  MG  --  1.8  PHOS  --  2.9   Liver Function Tests: Recent Labs  Lab 05/15/19 1201  AST 28  ALT 21  ALKPHOS 72  BILITOT 0.6  PROT 7.8  ALBUMIN 4.0   No results for input(s): LIPASE, AMYLASE in the last 168 hours. No results for input(s): AMMONIA in the last 168 hours. CBC: Recent Labs  Lab 05/15/19 1201 05/17/19 0448  WBC 12.1* 10.6*  NEUTROABS 7.0 8.5*  HGB 15.5 13.8  HCT 46.5 41.7  MCV 88.1 88.7  PLT 245 221   Cardiac Enzymes: No results for input(s): CKTOTAL, CKMB, CKMBINDEX, TROPONINI in the last 168 hours. BNP: Invalid input(s): POCBNP CBG: Recent Labs  Lab 05/16/19 1108 05/16/19 1210 05/16/19 1640 05/16/19 2136  05/17/19 0738  GLUCAP 132* 148* 271* 266* 187*   D-Dimer No results for input(s): DDIMER in the last 72 hours. Hgb A1c No results for input(s): HGBA1C in the last 72 hours. Lipid Profile No results for input(s): CHOL, HDL, LDLCALC, TRIG, CHOLHDL, LDLDIRECT in the last 72 hours. Thyroid function  studies No results for input(s): TSH, T4TOTAL, T3FREE, THYROIDAB in the last 72 hours.  Invalid input(s): FREET3 Anemia work up No results for input(s): VITAMINB12, FOLATE, FERRITIN, TIBC, IRON, RETICCTPCT in the last 72 hours. Urinalysis    Component Value Date/Time   COLORURINE YELLOW 05/15/2019 1400   APPEARANCEUR CLEAR 05/15/2019 1400   LABSPEC 1.005 05/15/2019 1400   PHURINE 7.0 05/15/2019 1400   GLUCOSEU NEGATIVE 05/15/2019 1400   HGBUR NEGATIVE 05/15/2019 1400   BILIRUBINUR NEGATIVE 05/15/2019 1400   KETONESUR NEGATIVE 05/15/2019 1400   PROTEINUR NEGATIVE 05/15/2019 1400   NITRITE NEGATIVE 05/15/2019 1400   LEUKOCYTESUR NEGATIVE 05/15/2019 1400   Sepsis Labs Invalid input(s): PROCALCITONIN,  WBC,  LACTICIDVEN Microbiology Recent Results (from the past 240 hour(s))  SARS CORONAVIRUS 2 (TAT 6-24 HRS) Nasopharyngeal Nasopharyngeal Swab     Status: None   Collection Time: 05/15/19  3:41 PM   Specimen: Nasopharyngeal Swab  Result Value Ref Range Status   SARS Coronavirus 2 NEGATIVE NEGATIVE Final    Comment: (NOTE) SARS-CoV-2 target nucleic acids are NOT DETECTED. The SARS-CoV-2 RNA is generally detectable in upper and lower respiratory specimens during the acute phase of infection. Negative results do not preclude SARS-CoV-2 infection, do not rule out co-infections with other pathogens, and should not be used as the sole basis for treatment or other patient management decisions. Negative results must be combined with clinical observations, patient history, and epidemiological information. The expected result is Negative. Fact Sheet for  Patients: SugarRoll.be Fact Sheet for Healthcare Providers: https://www.woods-mathews.com/ This test is not yet approved or cleared by the [place] FDA and  has been authorized for detection and/or diagnosis of SARS-CoV-2 by FDA under an Emergency Use Authorization (EUA). This EUA will remain  in effect (meaning this test can be used) for the duration of the COVID-19 declaration under Section 56 4(b)(1) of the Act, 21 U.S.C. section 360bbb-3(b)(1), unless the authorization is terminated or revoked sooner. Performed at [facility], [place] [address], [place], [place] [address]   Blood culture (routine x 2)     Status: None (Preliminary result)   Collection Time: 05/15/19  4:00 PM   Specimen: BLOOD RIGHT FOREARM  Result Value Ref Range Status   Specimen Description BLOOD RIGHT FOREARM  Final   Special Requests   Final    AEROBIC BOTTLE ONLY Blood Culture results may not be optimal due to an inadequate volume of blood received in culture bottles   Culture   Final    NO GROWTH < 24 HOURS Performed at [facility], [place] [address] [place], [place] [address]    Report Status PENDING  Incomplete  Blood culture (routine x 2)     Status: None (Preliminary result)   Collection Time: 05/15/19  4:02 PM   Specimen: BLOOD LEFT FOREARM  Result Value Ref Range Status   Specimen Description BLOOD LEFT FOREARM  Final   Special Requests   Final    BOTTLES DRAWN AEROBIC AND ANAEROBIC Blood Culture results may not be optimal due to an inadequate volume of blood received in culture bottles   Culture   Final    NO GROWTH < 24 HOURS Performed at [facility], [place] [address], [place], [place] [address]    Report Status PENDING  Incomplete  Surgical pcr screen     Status: Abnormal   Collection Time: 05/16/19  8:43 AM   Specimen: Nasal Mucosa; Nasal Swab  Result Value Ref Range Status  MRSA, PCR NEGATIVE NEGATIVE Final   Staphylococcus  aureus POSITIVE (A) NEGATIVE Final    Comment: (NOTE) The Xpert SA Assay (FDA approved for NASAL specimens in patients 49 years of age and older), is one component of a comprehensive surveillance program. It is not intended to diagnose infection nor to guide or monitor treatment. Performed at [facility], [place] [address], [place], [place] [address]      Time coordinating discharge: 32 minutes  SIGNED:   Barb Merino, MD  [facility] 05/17/2019, 11:26 AM

## 2019-05-20 LAB — CULTURE, BLOOD (ROUTINE X 2)
Culture: NO GROWTH
Culture: NO GROWTH

## 2019-05-21 ENCOUNTER — Other Ambulatory Visit: Payer: Self-pay

## 2019-05-21 ENCOUNTER — Encounter: Payer: Self-pay | Admitting: Infectious Disease

## 2019-05-21 ENCOUNTER — Ambulatory Visit (INDEPENDENT_AMBULATORY_CARE_PROVIDER_SITE_OTHER): Payer: Self-pay | Admitting: Infectious Disease

## 2019-05-21 VITALS — BP 152/92 | HR 107 | Temp 97.9°F | Wt 246.0 lb

## 2019-05-21 DIAGNOSIS — E11628 Type 2 diabetes mellitus with other skin complications: Secondary | ICD-10-CM

## 2019-05-21 DIAGNOSIS — L089 Local infection of the skin and subcutaneous tissue, unspecified: Secondary | ICD-10-CM

## 2019-05-21 DIAGNOSIS — F1010 Alcohol abuse, uncomplicated: Secondary | ICD-10-CM

## 2019-05-21 DIAGNOSIS — M869 Osteomyelitis, unspecified: Secondary | ICD-10-CM

## 2019-05-21 NOTE — Progress Notes (Signed)
Subjective:   Chief complaint pain in foot where he had surgery with amputation of his toe  Patient ID: Chris Smith, male    DOB: 1975/09/20, 43 y.o.   MRN: [id number]  HPI   43 year old male with a past medical history significant for O1HY complicated by peripheral neuropathy and right great toe osteomyelitis, HTN, alcohol and tobacco abuse who presented on 10/9 for worsening osteomyelitis.  Amputation was recommended.   He wanted to go home with IV antibiotics and this was arranged initially with IV ertapenem and oral doxycycline.  Unfortunately though however it had to be set up with short stay and he is to stop coming to the short stay infusions reliably.  We ended up removing his central line and placing him on oral antibiotics in the form of Augmentin and doxycycline.  He had worsening of his wound and was readmitted the hospital.  He was placed intravenous antibiotics and ultimately underwent amputation of his big toe by Dr. Sharol Given.  Apparently the margins were clean.  There were no culture sent but he was sent home on oral doxycycline.  He is seeing Dr. Sharol Given in 2 days time.  He says he still has quite a bit of pain postoperatively.  We examined the wound and there was an area that was pale between his right big toe.  Turned by this area.  He also has an area that is pale on the bottom of his foot as well see picture.  He states that the osteomyelitis initially looked like this when he first developed it.  Past Medical History:  Diagnosis Date  . Acute respiratory failure ([place]) 09/2014  . Amnesia   . Aspiration pneumonia ([place]) 09/2014  . Chronic back pain   . Diabetes mellitus without complication ([place])   . Encephalopathy 09/2014  . ETOH abuse   . Hypertension   . Ischemic hepatitis unknown  . Rhabdomyolysis unknown  . Sciatica     Past Surgical History:  Procedure Laterality Date  . AMPUTATION TOE Right 05/16/2019   Procedure: AMPUTATION TOE, right great;  Surgeon: Newt Minion, MD;  Location: [facility];  Service: Orthopedics;  Laterality: Right;  . APPENDECTOMY    . BACK SURGERY  2002   Lumbar  . IR PATIENT EVAL TECH 0-60 MINS  05/12/2019    Family History  Problem Relation Age of Onset  . Cancer Mother        cause of death, ovarian & colon  . Diabetes Father   . Hypertension Father   . Gout Father   . Hyperlipidemia Father       Social History   Socioeconomic History  . Marital status: Single    Spouse name: Not on file  . Number of children: 2  . Years of education: Not on file  . Highest education level: Not on file  Occupational History  . Occupation: Disability  Social Needs  . Financial resource strain: Not on file  . Food insecurity    Worry: Not on file    Inability: Not on file  . Transportation needs    Medical: Not on file    Non-medical: Not on file  Tobacco Use  . Smoking status: Current Every Day Smoker    Packs/day: 0.50    Years: 18.00    Pack years: 9.00    Types: Cigarettes  . Smokeless tobacco: Never Used  Substance and Sexual Activity  . Alcohol use: Yes    Alcohol/week: 0.0 standard drinks  Comment: 3 OR 4 DAYS PER WEEK  . Drug use: No  . Sexual activity: Not on file  Lifestyle  . Physical activity    Days per week: Not on file    Minutes per session: Not on file  . Stress: Not on file  Relationships  . Social Herbalist on phone: Not on file    Gets together: Not on file    Attends religious service: Not on file    Active member of club or organization: Not on file    Attends meetings of clubs or organizations: Not on file    Relationship status: Not on file  Other Topics Concern  . Not on file  Social History Narrative  . Not on file    No Known Allergies   Current Outpatient Medications:  .  doxycycline (VIBRA-TABS) 100 MG tablet, Take 1 tablet (100 mg total) by mouth every 12 (twelve) hours for 14 days., Disp: 28 tablet, Rfl: 0 .  glucose monitoring kit (FREESTYLE) monitoring  kit, 1 each by Does not apply route 4 (four) times daily - after meals and at bedtime. 1 month Diabetic Testing Supplies for QAC-QHS accuchecks., Disp: 1 each, Rfl: 1 .  ibuprofen (ADVIL) 200 MG tablet, Take 800 mg by mouth every 8 (eight) hours as needed for moderate pain., Disp: , Rfl:  .  Insulin Glargine (BASAGLAR KWIKPEN) 100 UNIT/ML SOPN, Inject 0.2 mLs (20 Units total) into the skin daily., Disp: 6 mL, Rfl: 0 .  Insulin Pen Needle (PEN NEEDLES) 31G X 8 MM MISC, Use as directed, Disp: 100 each, Rfl: 0 .  oxyCODONE (OXY IR/ROXICODONE) 5 MG immediate release tablet, Take 1 tablet (5 mg total) by mouth every 8 (eight) hours as needed for up to 7 days for moderate pain., Disp: 21 tablet, Rfl: 0 .  amLODipine (NORVASC) 10 MG tablet, Take 1 tablet (10 mg total) by mouth daily., Disp: 30 tablet, Rfl: 0 .  gabapentin (NEURONTIN) 100 MG capsule, Take 1 capsule (100 mg total) by mouth 3 (three) times daily., Disp: 90 capsule, Rfl: 0 .  glipiZIDE (GLUCOTROL) 5 MG tablet, Take 0.5 tablets (2.5 mg total) by mouth daily before breakfast., Disp: 15 tablet, Rfl: 0 .  lisinopril (ZESTRIL) 40 MG tablet, Take 1 tablet (40 mg total) by mouth daily., Disp: 30 tablet, Rfl: 0 .  metFORMIN (GLUCOPHAGE) 500 MG tablet, Take 1 tablet (500 mg total) by mouth 2 (two) times daily with a meal., Disp: 60 tablet, Rfl: 0   Review of Systems  Constitutional: Negative for chills and fever.  HENT: Negative for congestion and sore throat.   Eyes: Negative for photophobia.  Respiratory: Negative for cough, shortness of breath and wheezing.   Cardiovascular: Negative for chest pain, palpitations and leg swelling.  Gastrointestinal: Negative for abdominal pain, blood in stool, constipation, diarrhea, nausea and vomiting.  Genitourinary: Negative for dysuria, flank pain and hematuria.  Musculoskeletal: Positive for myalgias. Negative for back pain.  Skin: Positive for color change and wound. Negative for rash.  Neurological:  Negative for dizziness, weakness and headaches.  Hematological: Does not bruise/bleed easily.  Psychiatric/Behavioral: Negative for agitation, behavioral problems, decreased concentration, dysphoric mood and suicidal ideas.       Objective:   Physical Exam Constitutional:      General: He is not in acute distress.    Appearance: Normal appearance. He is well-developed. He is not ill-appearing or diaphoretic.  HENT:     Head: Normocephalic and atraumatic.  Right Ear: Hearing and external ear normal.     Left Ear: Hearing and external ear normal.     Nose: No nasal deformity or rhinorrhea.  Eyes:     General: No scleral icterus.    Conjunctiva/sclera: Conjunctivae normal.     Right eye: Right conjunctiva is not injected.     Left eye: Left conjunctiva is not injected.  Neck:     Musculoskeletal: Normal range of motion and neck supple.     Vascular: No JVD.  Cardiovascular:     Rate and Rhythm: Normal rate and regular rhythm.     Heart sounds: S1 normal and S2 normal.  Pulmonary:     Effort: Pulmonary effort is normal. No respiratory distress.  Abdominal:     General: Bowel sounds are normal.     Palpations: Abdomen is soft.  Musculoskeletal: Normal range of motion.     Right shoulder: Normal.     Left shoulder: Normal.     Right hip: Normal.     Left hip: Normal.     Right knee: Normal.     Left knee: Normal.  Lymphadenopathy:     Head:     Right side of head: No submandibular, preauricular or posterior auricular adenopathy.     Left side of head: No submandibular, preauricular or posterior auricular adenopathy.     Cervical: No cervical adenopathy.     Right cervical: No superficial or deep cervical adenopathy.    Left cervical: No superficial or deep cervical adenopathy.  Skin:    General: Skin is warm and dry.     Coloration: Skin is not pale.     Findings: No abrasion, bruising, ecchymosis, erythema, lesion or rash.     Nails: There is no clubbing.    Neurological:     General: No focal deficit present.     Mental Status: He is alert and oriented to person, place, and time.     Sensory: No sensory deficit.     Coordination: Coordination normal.     Gait: Gait normal.  Psychiatric:        Attention and Perception: He is attentive.        Mood and Affect: Mood normal.        Speech: Speech normal.        Behavior: Behavior normal. Behavior is cooperative.        Thought Content: Thought content normal.        Judgment: Judgment normal.     Amputations site 05/21/2019:          Assessment & Plan:    DFU with osteomyelitis sp amputation:  Go to see Dr. Sharol Given tomorrow.  He is on doxycycline at present.  He does have an area that is pale between the toes and also on the bottom of the foot.  Some of this could be potential due to moisture externally hope it is not smoldering adjacent infection.  If this continues to appear abnormal I would have a low threshold to get an MRI to investigate for potential contiguous spread of osteomyelitis.  I will schedule him to see me in 2 weeks time  Alcoholism:: likely contributes to his difficulties in adhering to therapy

## 2019-05-23 ENCOUNTER — Inpatient Hospital Stay: Payer: Self-pay | Admitting: Physician Assistant

## 2019-06-04 ENCOUNTER — Ambulatory Visit (INDEPENDENT_AMBULATORY_CARE_PROVIDER_SITE_OTHER): Payer: Self-pay | Admitting: Infectious Disease

## 2019-06-04 ENCOUNTER — Other Ambulatory Visit: Payer: Self-pay

## 2019-06-04 DIAGNOSIS — M869 Osteomyelitis, unspecified: Secondary | ICD-10-CM

## 2019-06-04 DIAGNOSIS — Z720 Tobacco use: Secondary | ICD-10-CM

## 2019-06-04 DIAGNOSIS — E1169 Type 2 diabetes mellitus with other specified complication: Secondary | ICD-10-CM

## 2019-06-04 DIAGNOSIS — F1010 Alcohol abuse, uncomplicated: Secondary | ICD-10-CM

## 2019-06-04 DIAGNOSIS — E0841 Diabetes mellitus due to underlying condition with diabetic mononeuropathy: Secondary | ICD-10-CM

## 2019-06-04 NOTE — Progress Notes (Signed)
Subjective:   Chief complaint: Foul-smelling drainage from his wound  Patient ID: Chris Smith, male    DOB: 1976/04/27, 43 y.o.   MRN: [id number]  HPI   43 year old male with a past medical history significant for L8VF complicated by peripheral neuropathy and right great toe osteomyelitis, HTN, alcohol and tobacco abuse who presented on 10/9 for worsening osteomyelitis.  Amputation was recommended.   He wanted to go home with IV antibiotics and this was arranged initially with IV ertapenem and oral doxycycline.  Unfortunately though however it had to be set up with short stay and he is to stop coming to the short stay infusions reliably.  We ended up removing his central line and placing him on oral antibiotics in the form of Augmentin and doxycycline.  He had worsening of his wound and was readmitted the hospital.  He was placed intravenous antibiotics and ultimately underwent amputation of his big toe by Dr. Sharol Given.  Apparently the margins were clean.  There were no culture sent but he was sent home on oral doxycycline.  He is seeing Dr. Sharol Given in 2 days time.  He did have quite a bit off pain postoperatively.  We examined the wound at the last visit and there was an area that was pale between his right big toe.  Turned by this area.  He also has an area that is pale on the bottom of his foot as well see picture.  He stated  that the osteomyelitis initially looked like this when he first developed it.  Since I last saw him he was not able to see Dr. Sharol Given due to some type of emergency if I understand it on the part of the providers office.  Patient states that over Thanksgiving his wound which was still dressed and not having been changed had developed a foul odor and when his aunt undressed it the stitch has had burst and the wound had dehisced.  There was also foul-smelling drainage from it.  He comes to clinic today for follow-up and the wound does seem to be more edematous.  The area  that was more pale in coloration has has a more normal color to at this point in time.  I am concerned however about the progress of his infection and would like to make sure that he is seen promptly by Dr. Sharol Given.  I will also schedule him back to see me in clinic before I go back into the inpatient world.  I would not yet pull the trigger on antibiotics but I have anxiety that he will need further surgery.  If he does I would like to get operative cultures to guide therapy.  Past Medical History:  Diagnosis Date  . Acute respiratory failure ([place]) 09/2014  . Amnesia   . Aspiration pneumonia ([place]) 09/2014  . Chronic back pain   . Diabetes mellitus without complication ([place])   . Encephalopathy 09/2014  . ETOH abuse   . Hypertension   . Ischemic hepatitis unknown  . Rhabdomyolysis unknown  . Sciatica     Past Surgical History:  Procedure Laterality Date  . AMPUTATION TOE Right 05/16/2019   Procedure: AMPUTATION TOE, right great;  Surgeon: Newt Minion, MD;  Location: [facility];  Service: Orthopedics;  Laterality: Right;  . APPENDECTOMY    . BACK SURGERY  2002   Lumbar  . IR PATIENT EVAL TECH 0-60 MINS  05/12/2019    Family History  Problem Relation Age of Onset  .  Cancer Mother        cause of death, ovarian & colon  . Diabetes Father   . Hypertension Father   . Gout Father   . Hyperlipidemia Father       Social History   Socioeconomic History  . Marital status: Single    Spouse name: Not on file  . Number of children: 2  . Years of education: Not on file  . Highest education level: Not on file  Occupational History  . Occupation: Disability  Social Needs  . Financial resource strain: Not on file  . Food insecurity    Worry: Not on file    Inability: Not on file  . Transportation needs    Medical: Not on file    Non-medical: Not on file  Tobacco Use  . Smoking status: Current Every Day Smoker    Packs/day: 0.50    Years: 18.00    Pack years: 9.00    Types:  Cigarettes  . Smokeless tobacco: Never Used  Substance and Sexual Activity  . Alcohol use: Yes    Alcohol/week: 0.0 standard drinks    Comment: 3 OR 4 DAYS PER WEEK  . Drug use: No  . Sexual activity: Not on file  Lifestyle  . Physical activity    Days per week: Not on file    Minutes per session: Not on file  . Stress: Not on file  Relationships  . Social Herbalist on phone: Not on file    Gets together: Not on file    Attends religious service: Not on file    Active member of club or organization: Not on file    Attends meetings of clubs or organizations: Not on file    Relationship status: Not on file  Other Topics Concern  . Not on file  Social History Narrative  . Not on file    No Known Allergies   Current Outpatient Medications:  .  amLODipine (NORVASC) 10 MG tablet, Take 1 tablet (10 mg total) by mouth daily., Disp: 30 tablet, Rfl: 0 .  gabapentin (NEURONTIN) 100 MG capsule, Take 1 capsule (100 mg total) by mouth 3 (three) times daily., Disp: 90 capsule, Rfl: 0 .  glipiZIDE (GLUCOTROL) 5 MG tablet, Take 0.5 tablets (2.5 mg total) by mouth daily before breakfast., Disp: 15 tablet, Rfl: 0 .  glucose monitoring kit (FREESTYLE) monitoring kit, 1 each by Does not apply route 4 (four) times daily - after meals and at bedtime. 1 month Diabetic Testing Supplies for QAC-QHS accuchecks., Disp: 1 each, Rfl: 1 .  ibuprofen (ADVIL) 200 MG tablet, Take 800 mg by mouth every 8 (eight) hours as needed for moderate pain., Disp: , Rfl:  .  Insulin Glargine (BASAGLAR KWIKPEN) 100 UNIT/ML SOPN, Inject 0.2 mLs (20 Units total) into the skin daily., Disp: 6 mL, Rfl: 0 .  Insulin Pen Needle (PEN NEEDLES) 31G X 8 MM MISC, Use as directed, Disp: 100 each, Rfl: 0 .  lisinopril (ZESTRIL) 40 MG tablet, Take 1 tablet (40 mg total) by mouth daily., Disp: 30 tablet, Rfl: 0 .  metFORMIN (GLUCOPHAGE) 500 MG tablet, Take 1 tablet (500 mg total) by mouth 2 (two) times daily with a meal., Disp:  60 tablet, Rfl: 0   Review of Systems  Constitutional: Negative for chills and fever.  HENT: Negative for congestion and sore throat.   Eyes: Negative for photophobia.  Respiratory: Negative for cough, shortness of breath and wheezing.   Cardiovascular:  Negative for chest pain, palpitations and leg swelling.  Gastrointestinal: Negative for abdominal pain, blood in stool, constipation, diarrhea, nausea and vomiting.  Genitourinary: Negative for dysuria, flank pain and hematuria.  Musculoskeletal: Positive for myalgias. Negative for back pain.  Skin: Positive for color change and wound. Negative for rash.  Neurological: Negative for dizziness, weakness and headaches.  Hematological: Does not bruise/bleed easily.  Psychiatric/Behavioral: Negative for agitation, behavioral problems, decreased concentration, dysphoric mood and suicidal ideas.       Objective:   Physical Exam Constitutional:      General: He is not in acute distress.    Appearance: Normal appearance. He is well-developed. He is not ill-appearing or diaphoretic.  HENT:     Head: Normocephalic and atraumatic.     Right Ear: Hearing and external ear normal.     Left Ear: Hearing and external ear normal.     Nose: No nasal deformity or rhinorrhea.  Eyes:     General: No scleral icterus.    Conjunctiva/sclera: Conjunctivae normal.     Right eye: Right conjunctiva is not injected.     Left eye: Left conjunctiva is not injected.  Neck:     Musculoskeletal: Normal range of motion and neck supple.     Vascular: No JVD.  Cardiovascular:     Rate and Rhythm: Normal rate and regular rhythm.     Heart sounds: S1 normal and S2 normal.  Pulmonary:     Effort: Pulmonary effort is normal. No respiratory distress.  Abdominal:     General: Bowel sounds are normal.     Palpations: Abdomen is soft.  Musculoskeletal: Normal range of motion.     Right shoulder: Normal.     Left shoulder: Normal.     Right hip: Normal.     Left  hip: Normal.     Right knee: Normal.     Left knee: Normal.  Lymphadenopathy:     Head:     Right side of head: No submandibular, preauricular or posterior auricular adenopathy.     Left side of head: No submandibular, preauricular or posterior auricular adenopathy.     Cervical: No cervical adenopathy.     Right cervical: No superficial or deep cervical adenopathy.    Left cervical: No superficial or deep cervical adenopathy.  Skin:    General: Skin is warm and dry.     Coloration: Skin is not pale.     Findings: No abrasion, bruising, ecchymosis, erythema, lesion or rash.     Nails: There is no clubbing.   Neurological:     General: No focal deficit present.     Mental Status: He is alert and oriented to person, place, and time.     Sensory: No sensory deficit.     Coordination: Coordination normal.     Gait: Gait normal.  Psychiatric:        Attention and Perception: He is attentive.        Mood and Affect: Mood normal.        Speech: Speech normal.        Behavior: Behavior normal. Behavior is cooperative.        Thought Content: Thought content normal.        Judgment: Judgment normal.     Amputations site 05/21/2019:      Wound June 04, 2019:        Assessment & Plan:    DFU with osteomyelitis sp amputation:  We put a phone call into Dr.  Duda's office hopefully he can be seen today or if not today as soon as possible.  I am scheduling to see me before going to the inpatient world on December 14.  He has had his flu shot pneumonia shot   Smoking: He is cut down to 2 cigarettes a day  Alcoholism: He tells me that he was seen in the hospital once for GI problems after drinking too much alcohol but that he does not have an issue with drinking alcohol

## 2019-06-04 NOTE — Progress Notes (Signed)
Called Dr. Jess Barters office and scheduled f/u 06/05/19 at 9 am

## 2019-06-05 ENCOUNTER — Encounter: Payer: Self-pay | Admitting: Orthopedic Surgery

## 2019-06-05 ENCOUNTER — Ambulatory Visit (INDEPENDENT_AMBULATORY_CARE_PROVIDER_SITE_OTHER): Payer: Self-pay | Admitting: Orthopedic Surgery

## 2019-06-05 VITALS — Ht 74.0 in | Wt 246.0 lb

## 2019-06-05 DIAGNOSIS — S98111A Complete traumatic amputation of right great toe, initial encounter: Secondary | ICD-10-CM

## 2019-06-05 NOTE — Progress Notes (Signed)
Office Visit Note   Patient: Chris Smith           Date of Birth: 1976/03/13           MRN: [id number] Visit Date: 06/05/2019              Requested by: No referring provider defined for this encounter. PCP: Patient, No Pcp Per  Chief Complaint  Patient presents with  . Right Foot - Routine Post Op    05/16/2019 right foot GT amp      HPI: Patient is a 43 year old gentleman who presents 3 weeks status post right great toe amputation.  Patient is currently walking on his foot full weightbearing.  Assessment & Plan: Visit Diagnoses:  1. Amputated great toe of right foot (HCC)     Plan: Discussed the importance of nonweightbearing recommended using crutches.  The importance of smoking cessation was discussed.  Recommended washing the wound daily with soap and water dry dressing chang  Follow-Up Instructions: Return in about 1 week (around 06/12/2019).   Ortho Exam  Patient is alert, oriented, no adenopathy, well-dressed, normal affect, normal respiratory effort. Examination patient has completed his antibiotics he is still smoking, after debridement of the great toe wound there is beefy granulation tissue there is no cellulitis no exposed bone no cellulitis no odor no drainage no signs of infection.  Imaging: No results found. No images are attached to the encounter.  Labs: Lab Results  Component Value Date   HGBA1C 8.8 (H) 03/12/2019   HGBA1C 11.0 (H) 04/28/2016   HGBA1C 10.10 10/13/2014   ESRSEDRATE 7 04/29/2019   ESRSEDRATE 2 04/15/2019   CRP <0.8 04/29/2019   CRP 1.2 (H) 04/15/2019   REPTSTATUS 05/20/2019 FINAL 05/15/2019   GRAMSTAIN  04/11/2019    RARE WBC PRESENT, PREDOMINANTLY PMN FEW GRAM POSITIVE COCCI RARE GRAM POSITIVE RODS    CULT  05/15/2019    NO GROWTH 5 DAYS Performed at [facility], [address] [place], [place] [address]      Lab Results  Component Value Date   ALBUMIN 4.0 05/15/2019   ALBUMIN 3.5 04/11/2019   ALBUMIN 3.6  03/11/2019    Lab Results  Component Value Date   MG 1.8 05/17/2019   MG 1.7 09/08/2016   MG 1.3 (L) 09/07/2016   No results found for: VD25OH  No results found for: PREALBUMIN CBC EXTENDED Latest Ref Rng & Units 05/17/2019 05/15/2019 04/29/2019  WBC 4.0 - 10.5 K/uL 10.6(H) 12.1(H) 6.6  RBC 4.22 - 5.81 MIL/uL 4.70 5.28 5.37  HGB 13.0 - 17.0 g/dL 93.7 90.2 40.9  HCT 73.5 - 52.0 % 41.7 46.5 48.1  PLT 150 - 400 K/uL 221 245 284  NEUTROABS 1.7 - 7.7 K/uL 8.5(H) 7.0 3.7  LYMPHSABS 0.7 - 4.0 K/uL 1.3 3.6 2.1     Body mass index is 31.58 kg/m.  Orders:  No orders of the defined types were placed in this encounter.  No orders of the defined types were placed in this encounter.    Procedures: No procedures performed  Clinical Data: No additional findings.  ROS:  All other systems negative, except as noted in the HPI. Review of Systems  Objective: Vital Signs: Ht 6\' 2"  (1.88 m)   Wt 246 lb (111.6 kg)   BMI 31.58 kg/m   Specialty Comments:  No specialty comments available.  PMFS History: Patient Active Problem List   Diagnosis Date Noted  . Osteomyelitis of right foot (HCC)   .  Type 2 diabetes mellitus with diabetic foot infection ([place]) 05/15/2019  . Osteomyelitis of great toe of right foot ([place])   . Diabetic polyneuropathy associated with type 2 diabetes mellitus ([place])   . Diabetic foot ulcer ([place]) 04/11/2019  . Osteomyelitis due to type 2 diabetes mellitus ([place]) 03/12/2019  . Sepsis ([place]) 03/12/2019  . Hyperglycemia 03/12/2019  . Poorly controlled diabetes mellitus ([place]) 03/12/2019  . Diabetic ulcer of right great toe ([place]) 03/11/2019  . Dehydration   . Nausea with vomiting 09/07/2016  . Hypokalemia 09/07/2016  . Abdominal pain 04/28/2016  . Enteritis 04/28/2016  . Acidosis 04/28/2016  . Prolonged Q-T interval on ECG 04/28/2016  . Hypomagnesemia 04/28/2016  . ETOH abuse 04/28/2016  . Abdominal pain, bilateral upper quadrant 04/28/2016  . Tachycardia  04/28/2016  . Tobacco abuse 04/28/2016  . Obesity 04/28/2016  . Fatty liver 04/28/2016  . Chronic back pain 04/28/2016  . Memory loss 10/25/2014  . Depression 10/25/2014  . Diabetic mononeuropathy associated with diabetes mellitus due to underlying condition ([place]) 10/25/2014  . Amnesia 10/13/2014  . Hypertension 10/13/2014  . Diabetes mellitus without complication ([place]) 51/88/4166  . Lumbago 10/13/2014   Past Medical History:  Diagnosis Date  . Acute respiratory failure ([place]) 09/2014  . Amnesia   . Aspiration pneumonia ([place]) 09/2014  . Chronic back pain   . Diabetes mellitus without complication ([place])   . Encephalopathy 09/2014  . ETOH abuse   . Hypertension   . Ischemic hepatitis unknown  . Rhabdomyolysis unknown  . Sciatica     Family History  Problem Relation Age of Onset  . Cancer Mother        cause of death, ovarian & colon  . Diabetes Father   . Hypertension Father   . Gout Father   . Hyperlipidemia Father     Past Surgical History:  Procedure Laterality Date  . AMPUTATION TOE Right 05/16/2019   Procedure: AMPUTATION TOE, right great;  Surgeon: Newt Minion, MD;  Location: [facility];  Service: Orthopedics;  Laterality: Right;  . APPENDECTOMY    . BACK SURGERY  2002   Lumbar  . IR PATIENT EVAL TECH 0-60 MINS  05/12/2019   Social History   Occupational History  . Occupation: Disability  Tobacco Use  . Smoking status: Current Every Day Smoker    Packs/day: 0.50    Years: 18.00    Pack years: 9.00    Types: Cigarettes  . Smokeless tobacco: Never Used  Substance and Sexual Activity  . Alcohol use: Yes    Alcohol/week: 0.0 standard drinks    Comment: 3 OR 4 DAYS PER WEEK  . Drug use: No  . Sexual activity: Not on file

## 2019-06-12 ENCOUNTER — Ambulatory Visit (INDEPENDENT_AMBULATORY_CARE_PROVIDER_SITE_OTHER): Payer: Self-pay | Admitting: Orthopedic Surgery

## 2019-06-12 ENCOUNTER — Encounter: Payer: Self-pay | Admitting: Orthopedic Surgery

## 2019-06-12 ENCOUNTER — Other Ambulatory Visit: Payer: Self-pay

## 2019-06-12 VITALS — Ht 74.0 in | Wt 246.0 lb

## 2019-06-12 DIAGNOSIS — Z89411 Acquired absence of right great toe: Secondary | ICD-10-CM

## 2019-06-12 DIAGNOSIS — S98111A Complete traumatic amputation of right great toe, initial encounter: Secondary | ICD-10-CM

## 2019-06-13 ENCOUNTER — Encounter: Payer: Self-pay | Admitting: Orthopedic Surgery

## 2019-06-13 NOTE — Progress Notes (Signed)
Office Visit Note   Patient: Chris Smith           Date of Birth: Nov 08, 1975           MRN: [id number] Visit Date: 06/12/2019              Requested by: No referring provider defined for this encounter. PCP: Patient, No Pcp Per  Chief Complaint  Patient presents with  . Right Foot - Routine Post Op    05/16/19 right foot GT amputation       HPI: Patient is a 43 year old gentleman who is status post right foot great toe amputation.  Patient has had some wound dehiscence.  Patient is currently manipulating the wound he states he has been using peroxide to cleanse the wound.  Assessment & Plan: Visit Diagnoses:  1. Amputated great toe of right foot (HCC)     Plan: Recommended soap and water for cleansing stop using peroxide continue with crutches and nonweightbearing follow-up in 1 week to harvest the sutures.  Follow-Up Instructions: Return in about 1 week (around 06/19/2019).   Ortho Exam  Patient is alert, oriented, no adenopathy, well-dressed, normal affect, normal respiratory effort. Patient is currently manipulating the wound with his hand there is good granulation tissue at the base of the wound this does not probe to bone the wound is 10 mm in diameter with 75% healthy granulation tissue.  There is no tenderness to palpation around the wound no clinical signs of infection.  Imaging: No results found. No images are attached to the encounter.  Labs: Lab Results  Component Value Date   HGBA1C 8.8 (H) 03/12/2019   HGBA1C 11.0 (H) 04/28/2016   HGBA1C 10.10 10/13/2014   ESRSEDRATE 7 04/29/2019   ESRSEDRATE 2 04/15/2019   CRP <0.8 04/29/2019   CRP 1.2 (H) 04/15/2019   REPTSTATUS 05/20/2019 FINAL 05/15/2019   GRAMSTAIN  04/11/2019    RARE WBC PRESENT, PREDOMINANTLY PMN FEW GRAM POSITIVE COCCI RARE GRAM POSITIVE RODS    CULT  05/15/2019    NO GROWTH 5 DAYS Performed at [facility], [address], [place], [place] [address]      Lab Results    Component Value Date   ALBUMIN 4.0 05/15/2019   ALBUMIN 3.5 04/11/2019   ALBUMIN 3.6 03/11/2019    Lab Results  Component Value Date   MG 1.8 05/17/2019   MG 1.7 09/08/2016   MG 1.3 (L) 09/07/2016   No results found for: VD25OH  No results found for: PREALBUMIN CBC EXTENDED Latest Ref Rng & Units 05/17/2019 05/15/2019 04/29/2019  WBC 4.0 - 10.5 K/uL 10.6(H) 12.1(H) 6.6  RBC 4.22 - 5.81 MIL/uL 4.70 5.28 5.37  HGB 13.0 - 17.0 g/dL 29.9 37.1 69.6  HCT 78.9 - 52.0 % 41.7 46.5 48.1  PLT 150 - 400 K/uL 221 245 284  NEUTROABS 1.7 - 7.7 K/uL 8.5(H) 7.0 3.7  LYMPHSABS 0.7 - 4.0 K/uL 1.3 3.6 2.1     Body mass index is 31.58 kg/m.  Orders:  No orders of the defined types were placed in this encounter.  No orders of the defined types were placed in this encounter.    Procedures: No procedures performed  Clinical Data: No additional findings.  ROS:  All other systems negative, except as noted in the HPI. Review of Systems  Objective: Vital Signs: Ht 6\' 2"  (1.88 m)   Wt 246 lb (111.6 kg)   BMI 31.58 kg/m   Specialty Comments:  No specialty  comments available.  PMFS History: Patient Active Problem List   Diagnosis Date Noted  . Osteomyelitis of right foot ([place])   . Type 2 diabetes mellitus with diabetic foot infection ([place]) 05/15/2019  . Osteomyelitis of great toe of right foot ([place])   . Diabetic polyneuropathy associated with type 2 diabetes mellitus ([place])   . Diabetic foot ulcer ([place]) 04/11/2019  . Osteomyelitis due to type 2 diabetes mellitus ([place]) 03/12/2019  . Sepsis ([place]) 03/12/2019  . Hyperglycemia 03/12/2019  . Poorly controlled diabetes mellitus ([place]) 03/12/2019  . Diabetic ulcer of right great toe ([place]) 03/11/2019  . Dehydration   . Nausea with vomiting 09/07/2016  . Hypokalemia 09/07/2016  . Abdominal pain 04/28/2016  . Enteritis 04/28/2016  . Acidosis 04/28/2016  . Prolonged Q-T interval on ECG 04/28/2016  . Hypomagnesemia 04/28/2016  . ETOH  abuse 04/28/2016  . Abdominal pain, bilateral upper quadrant 04/28/2016  . Tachycardia 04/28/2016  . Tobacco abuse 04/28/2016  . Obesity 04/28/2016  . Fatty liver 04/28/2016  . Chronic back pain 04/28/2016  . Memory loss 10/25/2014  . Depression 10/25/2014  . Diabetic mononeuropathy associated with diabetes mellitus due to underlying condition ([place]) 10/25/2014  . Amnesia 10/13/2014  . Hypertension 10/13/2014  . Diabetes mellitus without complication ([place]) 33/29/5188  . Lumbago 10/13/2014   Past Medical History:  Diagnosis Date  . Acute respiratory failure ([place]) 09/2014  . Amnesia   . Aspiration pneumonia ([place]) 09/2014  . Chronic back pain   . Diabetes mellitus without complication ([place])   . Encephalopathy 09/2014  . ETOH abuse   . Hypertension   . Ischemic hepatitis unknown  . Rhabdomyolysis unknown  . Sciatica     Family History  Problem Relation Age of Onset  . Cancer Mother        cause of death, ovarian & colon  . Diabetes Father   . Hypertension Father   . Gout Father   . Hyperlipidemia Father     Past Surgical History:  Procedure Laterality Date  . AMPUTATION TOE Right 05/16/2019   Procedure: AMPUTATION TOE, right great;  Surgeon: Newt Minion, MD;  Location: [facility];  Service: Orthopedics;  Laterality: Right;  . APPENDECTOMY    . BACK SURGERY  2002   Lumbar  . IR PATIENT EVAL TECH 0-60 MINS  05/12/2019   Social History   Occupational History  . Occupation: Disability  Tobacco Use  . Smoking status: Current Every Day Smoker    Packs/day: 0.50    Years: 18.00    Pack years: 9.00    Types: Cigarettes  . Smokeless tobacco: Never Used  Substance and Sexual Activity  . Alcohol use: Yes    Alcohol/week: 0.0 standard drinks    Comment: 3 OR 4 DAYS PER WEEK  . Drug use: No  . Sexual activity: Not on file

## 2019-06-17 ENCOUNTER — Ambulatory Visit (INDEPENDENT_AMBULATORY_CARE_PROVIDER_SITE_OTHER): Payer: Self-pay | Admitting: Infectious Disease

## 2019-06-17 ENCOUNTER — Other Ambulatory Visit: Payer: Self-pay

## 2019-06-17 ENCOUNTER — Encounter: Payer: Self-pay | Admitting: Infectious Disease

## 2019-06-17 VITALS — BP 176/117 | HR 106 | Wt 253.0 lb

## 2019-06-17 DIAGNOSIS — L089 Local infection of the skin and subcutaneous tissue, unspecified: Secondary | ICD-10-CM

## 2019-06-17 DIAGNOSIS — E1169 Type 2 diabetes mellitus with other specified complication: Secondary | ICD-10-CM

## 2019-06-17 DIAGNOSIS — E11628 Type 2 diabetes mellitus with other skin complications: Secondary | ICD-10-CM

## 2019-06-17 DIAGNOSIS — Z720 Tobacco use: Secondary | ICD-10-CM

## 2019-06-17 DIAGNOSIS — M869 Osteomyelitis, unspecified: Secondary | ICD-10-CM

## 2019-06-17 NOTE — Progress Notes (Signed)
Subjective:   Chief complaint: Follow-up for diabetic foot infection  Patient ID: Chris Smith, male    DOB: 1976-02-02, 43 y.o.   MRN: [id number]  HPI   43 year old male with a past medical history significant for A0TM complicated by peripheral neuropathy and right great toe osteomyelitis, HTN, alcohol and tobacco abuse who presented on 10/9 for worsening osteomyelitis.  Amputation was recommended.   He wanted to go home with IV antibiotics and this was arranged initially with IV ertapenem and oral doxycycline.  Unfortunately though however it had to be set up with short stay and he is to stop coming to the short stay infusions reliably.  We ended up removing his central line and placing him on oral antibiotics in the form of Augmentin and doxycycline.  He had worsening of his wound and was readmitted the hospital.  He was placed intravenous antibiotics and ultimately underwent amputation of his big toe by Dr. Sharol Given.  Apparently the margins were clean.  There were no culture sent but he was sent home on oral doxycycline.  He is seeing Dr. Sharol Given in 2 days time.  He did have quite a bit off pain postoperatively.  Over Thanksgiving his wound which was still dressed and not having been changed had developed a foul odor and when his aunt undressed it the stitch has had burst and the wound had dehisced.  There was also foul-smelling drainage from it.  He came to clinicfor follow-up and the wound did seem to be more edematous.    I was concerned however about the progress of his infection and would like to make sure that he is seen promptly by Dr. Sharol Given.   Saw Dr. Malon Kindle next day and also last week and is going to see him again this week.  Dr. Sharol Given is happy with how the wound is doing he is not having purulent drainage but more serous drainage from the area where the stitch came loose.    Past Medical History:  Diagnosis Date  . Acute respiratory failure ([place]) 09/2014  . Amnesia   .  Aspiration pneumonia ([place]) 09/2014  . Chronic back pain   . Diabetes mellitus without complication ([place])   . Encephalopathy 09/2014  . ETOH abuse   . Hypertension   . Ischemic hepatitis unknown  . Rhabdomyolysis unknown  . Sciatica     Past Surgical History:  Procedure Laterality Date  . AMPUTATION TOE Right 05/16/2019   Procedure: AMPUTATION TOE, right great;  Surgeon: Newt Minion, MD;  Location: [facility];  Service: Orthopedics;  Laterality: Right;  . APPENDECTOMY    . BACK SURGERY  2002   Lumbar  . IR PATIENT EVAL TECH 0-60 MINS  05/12/2019    Family History  Problem Relation Age of Onset  . Cancer Mother        cause of death, ovarian & colon  . Diabetes Father   . Hypertension Father   . Gout Father   . Hyperlipidemia Father       Social History   Socioeconomic History  . Marital status: Single    Spouse name: Not on file  . Number of children: 2  . Years of education: Not on file  . Highest education level: Not on file  Occupational History  . Occupation: Disability  Tobacco Use  . Smoking status: Current Every Day Smoker    Packs/day: 0.50    Years: 18.00    Pack years: 9.00    Types:  Cigarettes  . Smokeless tobacco: Never Used  Substance and Sexual Activity  . Alcohol use: Yes    Alcohol/week: 0.0 standard drinks    Comment: 3 OR 4 DAYS PER WEEK  . Drug use: No  . Sexual activity: Not on file  Other Topics Concern  . Not on file  Social History Narrative  . Not on file   Social Determinants of Health   Financial Resource Strain:   . Difficulty of Paying Living Expenses: Not on file  Food Insecurity:   . Worried About Charity fundraiser in the Last Year: Not on file  . Ran Out of Food in the Last Year: Not on file  Transportation Needs:   . Lack of Transportation (Medical): Not on file  . Lack of Transportation (Non-Medical): Not on file  Physical Activity:   . Days of Exercise per Week: Not on file  . Minutes of Exercise per Session: Not on  file  Stress:   . Feeling of Stress : Not on file  Social Connections:   . Frequency of Communication with Friends and Family: Not on file  . Frequency of Social Gatherings with Friends and Family: Not on file  . Attends Religious Services: Not on file  . Active Member of Clubs or Organizations: Not on file  . Attends Archivist Meetings: Not on file  . Marital Status: Not on file    No Known Allergies   Current Outpatient Medications:  .  glucose monitoring kit (FREESTYLE) monitoring kit, 1 each by Does not apply route 4 (four) times daily - after meals and at bedtime. 1 month Diabetic Testing Supplies for QAC-QHS accuchecks., Disp: 1 each, Rfl: 1 .  ibuprofen (ADVIL) 200 MG tablet, Take 800 mg by mouth every 8 (eight) hours as needed for moderate pain., Disp: , Rfl:  .  Insulin Pen Needle (PEN NEEDLES) 31G X 8 MM MISC, Use as directed, Disp: 100 each, Rfl: 0 .  amLODipine (NORVASC) 10 MG tablet, Take 1 tablet (10 mg total) by mouth daily., Disp: 30 tablet, Rfl: 0 .  gabapentin (NEURONTIN) 100 MG capsule, Take 1 capsule (100 mg total) by mouth 3 (three) times daily., Disp: 90 capsule, Rfl: 0 .  glipiZIDE (GLUCOTROL) 5 MG tablet, Take 0.5 tablets (2.5 mg total) by mouth daily before breakfast., Disp: 15 tablet, Rfl: 0 .  Insulin Glargine (BASAGLAR KWIKPEN) 100 UNIT/ML SOPN, Inject 0.2 mLs (20 Units total) into the skin daily., Disp: 6 mL, Rfl: 0 .  lisinopril (ZESTRIL) 40 MG tablet, Take 1 tablet (40 mg total) by mouth daily., Disp: 30 tablet, Rfl: 0 .  metFORMIN (GLUCOPHAGE) 500 MG tablet, Take 1 tablet (500 mg total) by mouth 2 (two) times daily with a meal., Disp: 60 tablet, Rfl: 0   Review of Systems  Constitutional: Negative for chills and fever.  HENT: Negative for congestion and sore throat.   Eyes: Negative for photophobia.  Respiratory: Negative for cough, shortness of breath and wheezing.   Cardiovascular: Negative for chest pain, palpitations and leg swelling.    Gastrointestinal: Negative for abdominal pain, blood in stool, constipation, diarrhea, nausea and vomiting.  Genitourinary: Negative for dysuria, flank pain and hematuria.  Musculoskeletal: Positive for myalgias. Negative for back pain.  Skin: Positive for color change and wound. Negative for rash.  Neurological: Negative for dizziness, weakness and headaches.  Hematological: Does not bruise/bleed easily.  Psychiatric/Behavioral: Negative for agitation, behavioral problems, decreased concentration, dysphoric mood and suicidal ideas.  Objective:   Physical Exam Constitutional:      General: He is not in acute distress.    Appearance: Normal appearance. He is well-developed. He is not ill-appearing or diaphoretic.  HENT:     Head: Normocephalic and atraumatic.     Right Ear: Hearing and external ear normal.     Left Ear: Hearing and external ear normal.     Nose: No nasal deformity or rhinorrhea.  Eyes:     General: No scleral icterus.    Conjunctiva/sclera: Conjunctivae normal.     Right eye: Right conjunctiva is not injected.     Left eye: Left conjunctiva is not injected.  Neck:     Vascular: No JVD.  Cardiovascular:     Rate and Rhythm: Normal rate and regular rhythm.     Heart sounds: S1 normal and S2 normal.  Pulmonary:     Effort: Pulmonary effort is normal. No respiratory distress.  Abdominal:     General: Bowel sounds are normal.     Palpations: Abdomen is soft.  Musculoskeletal:        General: Normal range of motion.     Right shoulder: Normal.     Left shoulder: Normal.     Cervical back: Normal range of motion and neck supple.     Right hip: Normal.     Left hip: Normal.     Right knee: Normal.     Left knee: Normal.  Lymphadenopathy:     Head:     Right side of head: No submandibular, preauricular or posterior auricular adenopathy.     Left side of head: No submandibular, preauricular or posterior auricular adenopathy.     Cervical: No cervical  adenopathy.     Right cervical: No superficial or deep cervical adenopathy.    Left cervical: No superficial or deep cervical adenopathy.  Skin:    General: Skin is warm and dry.     Coloration: Skin is not pale.     Findings: No abrasion, bruising, ecchymosis, erythema, lesion or rash.     Nails: There is no clubbing.  Neurological:     General: No focal deficit present.     Mental Status: He is alert and oriented to person, place, and time.     Sensory: No sensory deficit.     Coordination: Coordination normal.     Gait: Gait normal.  Psychiatric:        Attention and Perception: He is attentive.        Mood and Affect: Mood normal.        Speech: Speech normal.        Behavior: Behavior normal. Behavior is cooperative.        Thought Content: Thought content normal.        Judgment: Judgment normal.     Amputations site 05/21/2019:      Wound June 04, 2019:     Wound June 17, 2019:         Assessment & Plan:    DFU with osteomyelitis sp amputation:  He appears to be doing much better and is following closely with Dr. Sharol Given I think he can come back and see [place] as needed and we need to schedule follow-up appointment this point in time  Smoking: He is cut down to 2 cigarettes a day

## 2019-06-19 ENCOUNTER — Ambulatory Visit (INDEPENDENT_AMBULATORY_CARE_PROVIDER_SITE_OTHER): Payer: Self-pay | Admitting: Orthopedic Surgery

## 2019-06-19 ENCOUNTER — Other Ambulatory Visit: Payer: Self-pay

## 2019-06-19 ENCOUNTER — Encounter: Payer: Self-pay | Admitting: Orthopedic Surgery

## 2019-06-19 VITALS — Ht 74.0 in | Wt 253.0 lb

## 2019-06-19 DIAGNOSIS — S98111A Complete traumatic amputation of right great toe, initial encounter: Secondary | ICD-10-CM

## 2019-06-19 DIAGNOSIS — Z89411 Acquired absence of right great toe: Secondary | ICD-10-CM

## 2019-06-19 NOTE — Progress Notes (Signed)
Office Visit Note   Patient: Chris Smith           Date of Birth: Apr 27, 1976           MRN: [id number] Visit Date: 06/19/2019              Requested by: No referring provider defined for this encounter. PCP: Patient, No Pcp Per  Chief Complaint  Patient presents with  . Right Foot - Routine Post Op    05/16/19 right foot GT amputation       HPI: This is a pleasant gentleman who is now 5 weeks status post right great toe amputation he did have some difficulty with a little bit of dehiscence of the wound.  He has been doing daily dressing changes.  He feels it is slowly getting better  Assessment & Plan: Visit Diagnoses: No diagnosis found.  Plan: He will continue to do daily dressing changes with Iodosorb he will follow up in 1 week Follow-Up Instructions: No follow-ups on file.   Ortho Exam  Patient is alert, oriented, no adenopathy, well-dressed, normal affect, normal respiratory effort. Wound dehiscence is improving he has some fibrous tissue which was debrided back to healthy bleeding tissue no surrounding cellulitis no purulent drainage  Imaging: No results found. No images are attached to the encounter.  Labs: Lab Results  Component Value Date   HGBA1C 8.8 (H) 03/12/2019   HGBA1C 11.0 (H) 04/28/2016   HGBA1C 10.10 10/13/2014   ESRSEDRATE 7 04/29/2019   ESRSEDRATE 2 04/15/2019   CRP <0.8 04/29/2019   CRP 1.2 (H) 04/15/2019   REPTSTATUS 05/20/2019 FINAL 05/15/2019   GRAMSTAIN  04/11/2019    RARE WBC PRESENT, PREDOMINANTLY PMN FEW GRAM POSITIVE COCCI RARE GRAM POSITIVE RODS    CULT  05/15/2019    NO GROWTH 5 DAYS Performed at [facility], [place] [address], [place], [place] [address]      Lab Results  Component Value Date   ALBUMIN 4.0 05/15/2019   ALBUMIN 3.5 04/11/2019   ALBUMIN 3.6 03/11/2019    Lab Results  Component Value Date   MG 1.8 05/17/2019   MG 1.7 09/08/2016   MG 1.3 (L) 09/07/2016   No results found for: VD25OH  No  results found for: PREALBUMIN CBC EXTENDED Latest Ref Rng & Units 05/17/2019 05/15/2019 04/29/2019  WBC 4.0 - 10.5 K/uL 10.6(H) 12.1(H) 6.6  RBC 4.22 - 5.81 MIL/uL 4.70 5.28 5.37  HGB 13.0 - 17.0 g/dL 13.8 15.5 16.0  HCT 39.0 - 52.0 % 41.7 46.5 48.1  PLT 150 - 400 K/uL 221 245 284  NEUTROABS 1.7 - 7.7 K/uL 8.5(H) 7.0 3.7  LYMPHSABS 0.7 - 4.0 K/uL 1.3 3.6 2.1     Body mass index is 32.48 kg/m.  Orders:  No orders of the defined types were placed in this encounter.  No orders of the defined types were placed in this encounter.    Procedures: No procedures performed  Clinical Data: No additional findings.  ROS:  All other systems negative, except as noted in the HPI. Review of Systems  Objective: Vital Signs: Ht 6\' 2"  (1.88 m)   Wt 253 lb (114.8 kg)   BMI 32.48 kg/m   Specialty Comments:  No specialty comments available.  PMFS History: Patient Active Problem List   Diagnosis Date Noted  . Osteomyelitis of right foot ([place])   . Type 2 diabetes mellitus with diabetic foot infection ([place]) 05/15/2019  . Osteomyelitis of great toe of right  foot (HCC)   . Diabetic polyneuropathy associated with type 2 diabetes mellitus (HCC)   . Diabetic foot ulcer (HCC) 04/11/2019  . Osteomyelitis due to type 2 diabetes mellitus (HCC) 03/12/2019  . Sepsis (HCC) 03/12/2019  . Hyperglycemia 03/12/2019  . Poorly controlled diabetes mellitus (HCC) 03/12/2019  . Diabetic ulcer of right great toe (HCC) 03/11/2019  . Dehydration   . Nausea with vomiting 09/07/2016  . Hypokalemia 09/07/2016  . Abdominal pain 04/28/2016  . Enteritis 04/28/2016  . Acidosis 04/28/2016  . Prolonged Q-T interval on ECG 04/28/2016  . Hypomagnesemia 04/28/2016  . ETOH abuse 04/28/2016  . Abdominal pain, bilateral upper quadrant 04/28/2016  . Tachycardia 04/28/2016  . Tobacco abuse 04/28/2016  . Obesity 04/28/2016  . Fatty liver 04/28/2016  . Chronic back pain 04/28/2016  . Memory loss 10/25/2014  .  Depression 10/25/2014  . Diabetic mononeuropathy associated with diabetes mellitus due to underlying condition (HCC) 10/25/2014  . Amnesia 10/13/2014  . Hypertension 10/13/2014  . Diabetes mellitus without complication (HCC) 10/13/2014  . Lumbago 10/13/2014   Past Medical History:  Diagnosis Date  . Acute respiratory failure (HCC) 09/2014  . Amnesia   . Aspiration pneumonia (HCC) 09/2014  . Chronic back pain   . Diabetes mellitus without complication (HCC)   . Encephalopathy 09/2014  . ETOH abuse   . Hypertension   . Ischemic hepatitis unknown  . Rhabdomyolysis unknown  . Sciatica     Family History  Problem Relation Age of Onset  . Cancer Mother        cause of death, ovarian & colon  . Diabetes Father   . Hypertension Father   . Gout Father   . Hyperlipidemia Father     Past Surgical History:  Procedure Laterality Date  . AMPUTATION TOE Right 05/16/2019   Procedure: AMPUTATION TOE, right great;  Surgeon: Nadara Mustard, MD;  [facility];  Service: Orthopedics;  Laterality: Right;  . APPENDECTOMY    . BACK SURGERY  2002   Lumbar  . IR PATIENT EVAL TECH 0-60 MINS  05/12/2019   Social History   Occupational History  . Occupation: Disability  Tobacco Use  . Smoking status: Current Every Day Smoker    Packs/day: 0.50    Years: 18.00    Pack years: 9.00    Types: Cigarettes  . Smokeless tobacco: Never Used  Substance and Sexual Activity  . Alcohol use: Yes    Alcohol/week: 0.0 standard drinks    Comment: 3 OR 4 DAYS PER WEEK  . Drug use: No  . Sexual activity: Not on file

## 2019-07-01 ENCOUNTER — Ambulatory Visit (INDEPENDENT_AMBULATORY_CARE_PROVIDER_SITE_OTHER): Payer: Self-pay | Admitting: Family

## 2019-07-01 ENCOUNTER — Encounter: Payer: Self-pay | Admitting: Family

## 2019-07-01 ENCOUNTER — Telehealth: Payer: Self-pay | Admitting: Family

## 2019-07-01 ENCOUNTER — Other Ambulatory Visit: Payer: Self-pay

## 2019-07-01 ENCOUNTER — Ambulatory Visit: Payer: Self-pay

## 2019-07-01 VITALS — Ht 74.0 in | Wt 253.0 lb

## 2019-07-01 DIAGNOSIS — S98111A Complete traumatic amputation of right great toe, initial encounter: Secondary | ICD-10-CM

## 2019-07-01 DIAGNOSIS — Z89411 Acquired absence of right great toe: Secondary | ICD-10-CM

## 2019-07-01 MED ORDER — DOXYCYCLINE HYCLATE 100 MG PO TABS: 100.0000 mg | ORAL_TABLET | Freq: Two times a day (BID) | ORAL | 0 refills | Status: DC

## 2019-07-01 MED ORDER — DOXYCYCLINE HYCLATE 100 MG PO TABS: 100.0000 mg | ORAL_TABLET | Freq: Two times a day (BID) | ORAL | 0 refills | Status: AC

## 2019-07-01 NOTE — Telephone Encounter (Signed)
[facility] called in to let [name] know there is an error on the medication she sent them which was a refill request for Doxycycline, Chris Smith said they only refill medications for patients discharging from hospital, and we need to find out correct pharmacy for pt.

## 2019-07-01 NOTE — Telephone Encounter (Signed)
Please advise, thank you.

## 2019-07-01 NOTE — Progress Notes (Signed)
Post-Op Visit Note   Patient: Chris Smith           Date of Birth: January 15, 1976           MRN: [id number] Visit Date: 07/01/2019 PCP: Patient, No Pcp Per  Chief Complaint:  Chief Complaint  Patient presents with  . Right Foot - Routine Post Op    05/16/19 right foot GT amputation       HPI:  HPI  The patient is a 43 year old gentleman seen today status post right great toe amputation on November 13 he is concerned about the part of his incision that has not yet healed states that has dehisced initially a suture had popped.  He continues to see clear drainage.  He has been squeezing the area complains of some swelling to his foot especially over the medial column  Ortho Exam On examination of the right great toe amputation the incision has some mild dehiscence this is about 8 mm in length but there is flat pink tissue in the wound bed.  This does probe about 8 mm deep.  There is scant clear drainage no odor there is very mild surrounding erythema some mild swelling to the forefoot  Visit Diagnoses: No diagnosis found.  Plan: Radiographs of the right foot do not show silver nitrate probing to bone.  Will start him on doxycycline.  Discussed return precautions.  He will follow-up in the office in 1 week  Follow-Up Instructions: Return in about 2 weeks (around 07/15/2019).   Imaging: No results found.  Orders:  No orders of the defined types were placed in this encounter.  No orders of the defined types were placed in this encounter.    PMFS History: Patient Active Problem List   Diagnosis Date Noted  . Osteomyelitis of right foot (HCC)   . Type 2 diabetes mellitus with diabetic foot infection (HCC) 05/15/2019  . Osteomyelitis of great toe of right foot (HCC)   . Diabetic polyneuropathy associated with type 2 diabetes mellitus (HCC)   . Diabetic foot ulcer (HCC) 04/11/2019  . Osteomyelitis due to type 2 diabetes mellitus (HCC) 03/12/2019  . Sepsis (HCC) 03/12/2019  .  Hyperglycemia 03/12/2019  . Poorly controlled diabetes mellitus (HCC) 03/12/2019  . Diabetic ulcer of right great toe (HCC) 03/11/2019  . Dehydration   . Nausea with vomiting 09/07/2016  . Hypokalemia 09/07/2016  . Abdominal pain 04/28/2016  . Enteritis 04/28/2016  . Acidosis 04/28/2016  . Prolonged Q-T interval on ECG 04/28/2016  . Hypomagnesemia 04/28/2016  . ETOH abuse 04/28/2016  . Abdominal pain, bilateral upper quadrant 04/28/2016  . Tachycardia 04/28/2016  . Tobacco abuse 04/28/2016  . Obesity 04/28/2016  . Fatty liver 04/28/2016  . Chronic back pain 04/28/2016  . Memory loss 10/25/2014  . Depression 10/25/2014  . Diabetic mononeuropathy associated with diabetes mellitus due to underlying condition (HCC) 10/25/2014  . Amnesia 10/13/2014  . Hypertension 10/13/2014  . Diabetes mellitus without complication (HCC) 10/13/2014  . Lumbago 10/13/2014   Past Medical History:  Diagnosis Date  . Acute respiratory failure (HCC) 09/2014  . Amnesia   . Aspiration pneumonia (HCC) 09/2014  . Chronic back pain   . Diabetes mellitus without complication (HCC)   . Encephalopathy 09/2014  . ETOH abuse   . Hypertension   . Ischemic hepatitis unknown  . Rhabdomyolysis unknown  . Sciatica     Family History  Problem Relation Age of Onset  . Cancer Mother  cause of death, ovarian & colon  . Diabetes Father   . Hypertension Father   . Gout Father   . Hyperlipidemia Father     Past Surgical History:  Procedure Laterality Date  . AMPUTATION TOE Right 05/16/2019   Procedure: AMPUTATION TOE, right great;  Surgeon: Newt Minion, MD;  Location: [facility];  Service: Orthopedics;  Laterality: Right;  . APPENDECTOMY    . BACK SURGERY  2002   Lumbar  . IR PATIENT EVAL TECH 0-60 MINS  05/12/2019   Social History   Occupational History  . Occupation: Disability  Tobacco Use  . Smoking status: Current Every Day Smoker    Packs/day: 0.50    Years: 18.00    Pack years: 9.00     Types: Cigarettes  . Smokeless tobacco: Never Used  Substance and Sexual Activity  . Alcohol use: Yes    Alcohol/week: 0.0 standard drinks    Comment: 3 OR 4 DAYS PER WEEK  . Drug use: No  . Sexual activity: Not on file

## 2019-07-02 ENCOUNTER — Other Ambulatory Visit: Payer: Self-pay

## 2019-07-02 ENCOUNTER — Encounter (HOSPITAL_COMMUNITY): Payer: Self-pay | Admitting: Emergency Medicine

## 2019-07-02 ENCOUNTER — Emergency Department (HOSPITAL_COMMUNITY)
Admission: EM | Admit: 2019-07-02 | Discharge: 2019-07-02 | Disposition: A | Discharge status: Home / Self Care | Payer: Self-pay | Attending: Emergency Medicine | Admitting: Emergency Medicine

## 2019-07-02 DIAGNOSIS — Z89411 Acquired absence of right great toe: Secondary | ICD-10-CM | POA: Insufficient documentation

## 2019-07-02 DIAGNOSIS — F419 Anxiety disorder, unspecified: Secondary | ICD-10-CM | POA: Insufficient documentation

## 2019-07-02 DIAGNOSIS — F17210 Nicotine dependence, cigarettes, uncomplicated: Secondary | ICD-10-CM | POA: Insufficient documentation

## 2019-07-02 DIAGNOSIS — Y658 Other specified misadventures during surgical and medical care: Secondary | ICD-10-CM | POA: Insufficient documentation

## 2019-07-02 DIAGNOSIS — E119 Type 2 diabetes mellitus without complications: Secondary | ICD-10-CM | POA: Insufficient documentation

## 2019-07-02 DIAGNOSIS — I10 Essential (primary) hypertension: Secondary | ICD-10-CM | POA: Insufficient documentation

## 2019-07-02 DIAGNOSIS — T8140XD Infection following a procedure, unspecified, subsequent encounter: Secondary | ICD-10-CM | POA: Insufficient documentation

## 2019-07-02 LAB — COMPREHENSIVE METABOLIC PANEL
ALT: 29 U/L (ref 0–44)
AST: 36 U/L (ref 15–41)
Albumin: 3.5 g/dL (ref 3.5–5.0)
Alkaline Phosphatase: 74 U/L (ref 38–126)
Anion gap: 15 (ref 5–15)
BUN: 7 mg/dL (ref 6–20)
CO2: 22 mmol/L (ref 22–32)
Calcium: 9 mg/dL (ref 8.9–10.3)
Chloride: 97 mmol/L — ABNORMAL LOW (ref 98–111)
Creatinine, Ser: 0.73 mg/dL (ref 0.61–1.24)
GFR calc Af Amer: >60 mL/min (ref >60)
GFR calc non Af Amer: >60 mL/min (ref >60)
Glucose, Bld: 287 mg/dL — ABNORMAL HIGH (ref 70–99)
Potassium: 3.6 mmol/L (ref 3.5–5.1)
Sodium: 134 mmol/L — ABNORMAL LOW (ref 135–145)
Total Bilirubin: 0.7 mg/dL (ref 0.3–1.2)
Total Protein: 7.3 g/dL (ref 6.5–8.1)

## 2019-07-02 LAB — CBC WITH DIFFERENTIAL/PLATELET
Abs Immature Granulocytes: 0.04 10*3/uL (ref 0.00–0.07)
Basophils Absolute: 0 10*3/uL (ref 0.0–0.1)
Basophils Relative: 0 %
Eosinophils Absolute: 0.2 10*3/uL (ref 0.0–0.5)
Eosinophils Relative: 2 %
HCT: 43.7 % (ref 39.0–52.0)
Hemoglobin: 14.9 g/dL (ref 13.0–17.0)
Immature Granulocytes: 0 %
Lymphocytes Relative: 29 %
Lymphs Abs: 2.9 10*3/uL (ref 0.7–4.0)
MCH: 30.2 pg (ref 26.0–34.0)
MCHC: 34.1 g/dL (ref 30.0–36.0)
MCV: 88.6 fL (ref 80.0–100.0)
Monocytes Absolute: 0.9 10*3/uL (ref 0.1–1.0)
Monocytes Relative: 9 %
Neutro Abs: 6 10*3/uL (ref 1.7–7.7)
Neutrophils Relative %: 60 %
Platelets: 281 10*3/uL (ref 150–400)
RBC: 4.93 MIL/uL (ref 4.22–5.81)
RDW: 13.7 % (ref 11.5–15.5)
WBC: 10.1 10*3/uL (ref 4.0–10.5)
nRBC: 0 % (ref 0.0–0.2)

## 2019-07-02 LAB — LACTIC ACID, PLASMA: Lactic Acid, Venous: 1.8 mmol/L (ref 0.5–1.9)

## 2019-07-02 LAB — CBG MONITORING, ED: Glucose-Capillary: 219 mg/dL — ABNORMAL HIGH (ref 70–99)

## 2019-07-02 MED ORDER — LORAZEPAM 2 MG/ML IJ SOLN
0.2500 mg | Freq: Once | INTRAMUSCULAR | Status: AC
  Administered 2019-07-02: 0.25 mg via INTRAVENOUS
  Filled 2019-07-02: qty 1

## 2019-07-02 MED ORDER — VANCOMYCIN HCL 1750 MG/350ML IV SOLN
1750.0000 mg | Freq: Two times a day (BID) | INTRAVENOUS | Status: DC
  Administered 2019-07-02: 1750 mg via INTRAVENOUS
  Filled 2019-07-02 (×2): qty 350

## 2019-07-02 MED ORDER — SULFAMETHOXAZOLE-TRIMETHOPRIM 800-160 MG PO TABS: 1.0000 | ORAL_TABLET | Freq: Two times a day (BID) | ORAL | 0 refills | Status: AC

## 2019-07-02 MED ORDER — PIPERACILLIN-TAZOBACTAM 3.375 G IVPB 30 MIN
3.3750 g | Freq: Once | INTRAVENOUS | Status: AC
  Administered 2019-07-02: 3.375 g via INTRAVENOUS
  Filled 2019-07-02: qty 50

## 2019-07-02 MED ORDER — MORPHINE SULFATE (PF) 4 MG/ML IV SOLN
4.0000 mg | Freq: Once | INTRAVENOUS | Status: AC
  Administered 2019-07-02: 4 mg via INTRAVENOUS
  Filled 2019-07-02: qty 1

## 2019-07-02 MED FILL — SULFAMETHOXAZOLE-TMP DS TAB: 800-160 | 7 days supply | Qty: 14 | Fill #0

## 2019-07-02 NOTE — ED Notes (Signed)
Called for pt x3 for room, no response. 

## 2019-07-02 NOTE — ED Notes (Signed)
Checked patient blood sugar it was 219 notified the PA of blood sugar patient is resting with call bell in reach

## 2019-07-02 NOTE — Discharge Instructions (Signed)
Take Bactrim as prescribed until completed.  Take this instead of doxycycline.  Please follow-up with Dr. Alfonse Spruce as planned for recheck.  Please return to emergency department if develop any increasing pain, redness, streaking back your foot, fevers, or any other concerning symptom.

## 2019-07-02 NOTE — Discharge Planning (Signed)
EDCM reinstated pt in [facility] program to cover Bactrim co-pay.

## 2019-07-02 NOTE — Progress Notes (Signed)
Pharmacy Antibiotic Note  BYRL LATIN is a 43 y.o. male admitted on 07/02/2019 with cellulitis.  Pharmacy has been consulted for Vancomycin dosing.     Temp (24hrs), Avg:98 F (36.7 C), Min:98 F (36.7 C), Max:98 F (36.7 C)  Recent Labs  Lab 07/02/19 1029  WBC 10.1  CREATININE 0.73  LATICACIDVEN 1.8    Estimated Creatinine Clearance: 162 mL/min (by C-G formula based on SCr of 0.73 mg/dL).    No Known Allergies  Antimicrobials this admission: 12/30 Zosyn >>  12/30 Vancomycin >>   Dose adjustments this admission:   Microbiology results:  Plan:  - No loading dose of Vancomycin indicated for Cellulitis - Start Vancomycin 1750 mg IV q12h  - Est calc AUC 463 - Monitor patients renal function   Thank you for allowing pharmacy to be a part of this patient's care.  Duanne Limerick PharmD. BCPS  07/02/2019 2:57 PM

## 2019-07-02 NOTE — ED Triage Notes (Signed)
Pt reports having a toe amputation Nov and noticed an abscess to foot. Seen ortho and given abx. Endorses clear drainage. Upset that he has not seen Dr. Sharol Given since the surgery, has dealt with his PAs.

## 2019-07-02 NOTE — ED Provider Notes (Signed)
[facility] EMERGENCY DEPARTMENT Provider Note   CSN: [id number] Arrival date & time: 07/02/19  3845     History Chief Complaint  Patient presents with  . Wound Infection    Chris Smith is a 43 y.o. male with history of hypertension, diabetes, alcohol abuse, chronic back pain with recent right great toe amputation on 05/16/2019 who presents with continued swelling, redness, and drainage of his surgery site.  Patient has been taking doxycycline for the past couple weeks without relief.  He was seen by orthopedics yesterday and advised to continue doxycycline.  Wound was probed with silver nitrate and tracks almost to bone, but no osteomyelitis seen on x-ray done in the office yesterday.  Patient denies any fevers, chest pain, shortness of breath, abdominal pain, nausea, vomiting.  HPI     Past Medical History:  Diagnosis Date  . Acute respiratory failure ([place]) 09/2014  . Amnesia   . Aspiration pneumonia ([place]) 09/2014  . Chronic back pain   . Diabetes mellitus without complication ([place])   . Encephalopathy 09/2014  . ETOH abuse   . Hypertension   . Ischemic hepatitis unknown  . Rhabdomyolysis unknown  . Sciatica     Patient Active Problem List   Diagnosis Date Noted  . Osteomyelitis of right foot ([place])   . Type 2 diabetes mellitus with diabetic foot infection ([place]) 05/15/2019  . Osteomyelitis of great toe of right foot ([place])   . Diabetic polyneuropathy associated with type 2 diabetes mellitus ([place])   . Diabetic foot ulcer ([place]) 04/11/2019  . Osteomyelitis due to type 2 diabetes mellitus ([place]) 03/12/2019  . Sepsis ([place]) 03/12/2019  . Hyperglycemia 03/12/2019  . Poorly controlled diabetes mellitus ([place]) 03/12/2019  . Diabetic ulcer of right great toe ([place]) 03/11/2019  . Dehydration   . Nausea with vomiting 09/07/2016  . Hypokalemia 09/07/2016  . Abdominal pain 04/28/2016  . Enteritis 04/28/2016  . Acidosis 04/28/2016  . Prolonged Q-T interval on ECG  04/28/2016  . Hypomagnesemia 04/28/2016  . ETOH abuse 04/28/2016  . Abdominal pain, bilateral upper quadrant 04/28/2016  . Tachycardia 04/28/2016  . Tobacco abuse 04/28/2016  . Obesity 04/28/2016  . Fatty liver 04/28/2016  . Chronic back pain 04/28/2016  . Memory loss 10/25/2014  . Depression 10/25/2014  . Diabetic mononeuropathy associated with diabetes mellitus due to underlying condition ([place]) 10/25/2014  . Amnesia 10/13/2014  . Hypertension 10/13/2014  . Diabetes mellitus without complication ([place]) 36/46/8032  . Lumbago 10/13/2014    Past Surgical History:  Procedure Laterality Date  . AMPUTATION TOE Right 05/16/2019   Procedure: AMPUTATION TOE, right great;  Surgeon: Newt Minion, MD;  Location: [facility];  Service: Orthopedics;  Laterality: Right;  . APPENDECTOMY    . BACK SURGERY  2002   Lumbar  . IR PATIENT EVAL TECH 0-60 MINS  05/12/2019       Family History  Problem Relation Age of Onset  . Cancer Mother        cause of death, ovarian & colon  . Diabetes Father   . Hypertension Father   . Gout Father   . Hyperlipidemia Father     Social History   Tobacco Use  . Smoking status: Current Every Day Smoker    Packs/day: 0.50    Years: 18.00    Pack years: 9.00    Types: Cigarettes  . Smokeless tobacco: Never Used  Substance Use Topics  . Alcohol use: Yes    Alcohol/week: 0.0 standard drinks  Comment: 3 OR 4 DAYS PER WEEK  . Drug use: No    Home Medications Prior to Admission medications   Medication Sig Start Date End Date Taking? Authorizing Provider  ibuprofen (ADVIL) 200 MG tablet Take 800 mg by mouth every 8 (eight) hours as needed for moderate pain.   Yes [provider]  amLODipine (NORVASC) 10 MG tablet Take 1 tablet (10 mg total) by mouth daily. Patient not taking: Reported on 07/02/2019 04/16/19 05/16/19  Terrilee Croak, MD  doxycycline (VIBRA-TABS) 100 MG tablet Take 1 tablet (100 mg total) by mouth 2 (two) times daily. 07/01/19    Suzan Slick, NP  gabapentin (NEURONTIN) 100 MG capsule Take 1 capsule (100 mg total) by mouth 3 (three) times daily. Patient not taking: Reported on 07/02/2019 04/15/19 05/15/19  Terrilee Croak, MD  glipiZIDE (GLUCOTROL) 5 MG tablet Take 0.5 tablets (2.5 mg total) by mouth daily before breakfast. Patient not taking: Reported on 07/02/2019 04/16/19 05/16/19  Terrilee Croak, MD  glucose monitoring kit (FREESTYLE) monitoring kit 1 each by Does not apply route 4 (four) times daily - after meals and at bedtime. 1 month Diabetic Testing Supplies for QAC-QHS accuchecks. 04/17/19   Terrilee Croak, MD  Insulin Glargine (BASAGLAR KWIKPEN) 100 UNIT/ML SOPN Inject 0.2 mLs (20 Units total) into the skin daily. Patient not taking: Reported on 07/02/2019 05/17/19 06/16/19  Barb Merino, MD  Insulin Pen Needle (PEN NEEDLES) 31G X 8 MM MISC Use as directed 04/16/19   Terrilee Croak, MD  lisinopril (ZESTRIL) 40 MG tablet Take 1 tablet (40 mg total) by mouth daily. Patient not taking: Reported on 07/02/2019 04/16/19 05/16/19  Terrilee Croak, MD  metFORMIN (GLUCOPHAGE) 500 MG tablet Take 1 tablet (500 mg total) by mouth 2 (two) times daily with a meal. Patient not taking: Reported on 07/02/2019 04/15/19 05/15/19  Terrilee Croak, MD  sulfamethoxazole-trimethoprim (BACTRIM DS) 800-160 MG tablet Take 1 tablet by mouth 2 (two) times daily for 7 days. 07/02/19 07/09/19  Frederica Kuster, PA-C    Allergies    Patient has no known allergies.  Review of Systems   Review of Systems  Constitutional: Negative for chills and fever.  HENT: Negative for facial swelling and sore throat.   Respiratory: Negative for shortness of breath.   Cardiovascular: Negative for chest pain.  Gastrointestinal: Negative for abdominal pain, nausea and vomiting.  Genitourinary: Negative for dysuria.  Musculoskeletal: Negative for back pain.  Skin: Positive for color change and wound. Negative for rash.  Neurological: Negative for headaches.    Psychiatric/Behavioral: The patient is not nervous/anxious.     Physical Exam Updated Vital Signs BP 122/83 (BP Location: Right Arm)   Pulse (!) 112   Temp 98 F (36.7 C) (Oral)   Resp 18   SpO2 98%   Physical Exam Vitals and nursing note reviewed.  Constitutional:      General: He is not in acute distress.    Appearance: He is well-developed. He is not diaphoretic.  HENT:     Head: Normocephalic and atraumatic.     Mouth/Throat:     Pharynx: No oropharyngeal exudate.  Eyes:     General: No scleral icterus.       Right eye: No discharge.        Left eye: No discharge.     Conjunctiva/sclera: Conjunctivae normal.     Pupils: Pupils are equal, round, and reactive to light.  Neck:     Thyroid: No thyromegaly.  Cardiovascular:  Rate and Rhythm: Normal rate and regular rhythm.     Heart sounds: Normal heart sounds. No murmur. No friction rub. No gallop.   Pulmonary:     Effort: Pulmonary effort is normal. No respiratory distress.     Breath sounds: Normal breath sounds. No stridor. No wheezing or rales.  Abdominal:     General: Bowel sounds are normal. There is no distension.     Palpations: Abdomen is soft.     Tenderness: There is no abdominal tenderness. There is no guarding or rebound.  Musculoskeletal:     Cervical back: Normal range of motion and neck supple.  Lymphadenopathy:     Cervical: No cervical adenopathy.  Skin:    General: Skin is warm and dry.     Coloration: Skin is not pale.     Findings: No rash.     Comments: Wound present at area right great toe with small amount of purulent drainage visible; surrounding erythema and edema, see photos  Neurological:     Mental Status: He is alert.     Coordination: Coordination normal.         ED Results / Procedures / Treatments   Labs (all labs ordered are listed, but only abnormal results are displayed) Labs Reviewed  COMPREHENSIVE METABOLIC PANEL - Abnormal; Notable for the following components:       Result Value   Sodium 134 (*)    Chloride 97 (*)    Glucose, Bld 287 (*)    All other components within normal limits  CBG MONITORING, ED - Abnormal; Notable for the following components:   Glucose-Capillary 219 (*)    All other components within normal limits  AEROBIC CULTURE (SUPERFICIAL SPECIMEN)  LACTIC ACID, PLASMA  CBC WITH DIFFERENTIAL/PLATELET    EKG EKG Interpretation  Date/Time:  Wednesday July 02 2019 15:52:53 EST Ventricular Rate:  99 PR Interval:    QRS Duration: 96 QT Interval:  355 QTC Calculation: 456 R Axis:   33 Text Interpretation: Sinus rhythm Borderline prolonged PR interval No significant change since last tracing Confirmed by Deno Etienne [id number]) on 07/02/2019 3:55:13 PM   Radiology No results found.  Procedures Procedures (including critical care time)  Medications Ordered in ED Medications  piperacillin-tazobactam (ZOSYN) IVPB 3.375 g (has no administration in time range)  vancomycin (VANCOREADY) IVPB 1750 mg/350 mL (has no administration in time range)  morphine 4 MG/ML injection 4 mg (4 mg Intravenous Given 07/02/19 1535)  LORazepam (ATIVAN) injection 0.25 mg (0.25 mg Intravenous Given 07/02/19 1533)    ED Course  I have reviewed the triage vital signs and the nursing notes.  Pertinent labs & imaging results that were available during my care of the patient were reviewed by me and considered in my medical decision making (see chart for details).  Clinical Course as of Jul 01 1656  Wed Jul 02, 2019  1227 Not yet in the bed to be seen.   [SJ]  6734 I discussed patient case with Hilbert Odor, PA-C with orthopedics who advises dose of Zosyn and vancomycin in the ED and change doxycycline to Bactrim.  Continue wound care at home and follow-up in the office as planned.   [AL]    Clinical Course User Index [AL] Frederica Kuster, PA-C [SJ] Joy, Maceo Pro   MDM Rules/Calculators/A&P                      Patient presenting  with ongoing wound infection.  He  was seen by orthopedics yesterday and probed with silver nitrate and x-ray.  The wound space does not yet at the bone and there was no osteomyelitis seen.  Labs are unremarkable.  Patient is anxious, which I feel explain his tachycardia, less likely sepsis considering negative lactate and no leukocytosis.  As above, I discussed with Hilbert Odor, PA-C who advised a dose of Zosyn vancomycin in the ED and discharged home with Bactrim (prescribed to [facility] for delivery to patient today).  Close follow-up with orthopedics as planned.  Patient will be discharged by oncoming team when antibiotics are complete.  Final Clinical Impression(s) / ED Diagnoses Final diagnoses:  Postoperative infection, unspecified type, subsequent encounter    Rx / DC Orders ED Discharge Orders         Ordered    sulfamethoxazole-trimethoprim (BACTRIM DS) 800-160 MG tablet  2 times daily     07/02/19 [address], [place] 07/02/19 1657    Deno Etienne, DO 07/03/19 1505

## 2019-07-05 LAB — AEROBIC CULTURE W GRAM STAIN (SUPERFICIAL SPECIMEN): Culture: NORMAL

## 2019-07-08 ENCOUNTER — Telehealth: Payer: Self-pay | Admitting: *Deleted

## 2019-07-08 ENCOUNTER — Telehealth: Payer: Self-pay

## 2019-07-08 ENCOUNTER — Ambulatory Visit: Payer: Self-pay | Admitting: Infectious Disease

## 2019-07-08 ENCOUNTER — Ambulatory Visit: Payer: Self-pay | Admitting: Family

## 2019-07-08 NOTE — Telephone Encounter (Signed)
Post ED Visit - Positive Culture Follow-up  Culture report reviewed by antimicrobial stewardship pharmacist: [facility] []  Nathan Batchelder, Pharm.D. []  , Pharm.D., BCPS AQ-ID []  , Pharm.D., BCPS []  Celedonio Miyamoto, .D., BCPS []  [place], .D., BCPS, AAHIVP []  Georgina Pillion, Pharm.D., BCPS, AAHIVP []  [address], PharmD, BCPS []  , PharmD, BCPS []  [place], PharmD, BCPS []  [address], PharmD []  , PharmD, BCPS []  Estella Husk, PharmD  Pharmacy Team []  Lysle Pearl, PharmD []  , PharmD []  Phillips Climes, PharmD []  , Rph []  Agapito Games) , PharmD []  Verlan Friends, PharmD []  , PharmD []  Mervyn Gay, PharmD []  , PharmD []  Vinnie Level, PharmD []  [facility], PharmD []  , PharmD []  Len Childs, PharmD   Positive wound culture Treated with Sulfamethoxazole-Trimethoprim, organism sensitive to the same and no further patient follow-up is required at this time.  [facility] 07/23/2019, 10:30 AM

## 2019-07-08 NOTE — Telephone Encounter (Signed)
Received call today from patient's family stating patient passed away this morning. Family would like to cancel appointment for today and inform MD. MD is aware. Lorenso Courier, [place]

## 2019-08-04 DEATH — deceased

## 2020-09-25 IMAGING — DX DG FOOT COMPLETE 3+V*R*
3 series · 3 of 3 positions shown · non-contrast
Comparison: 02/10/2019

CLINICAL DATA: Right great toe ulceration

EXAM:
RIGHT FOOT COMPLETE - 3+ VIEW

[x foot ap right]
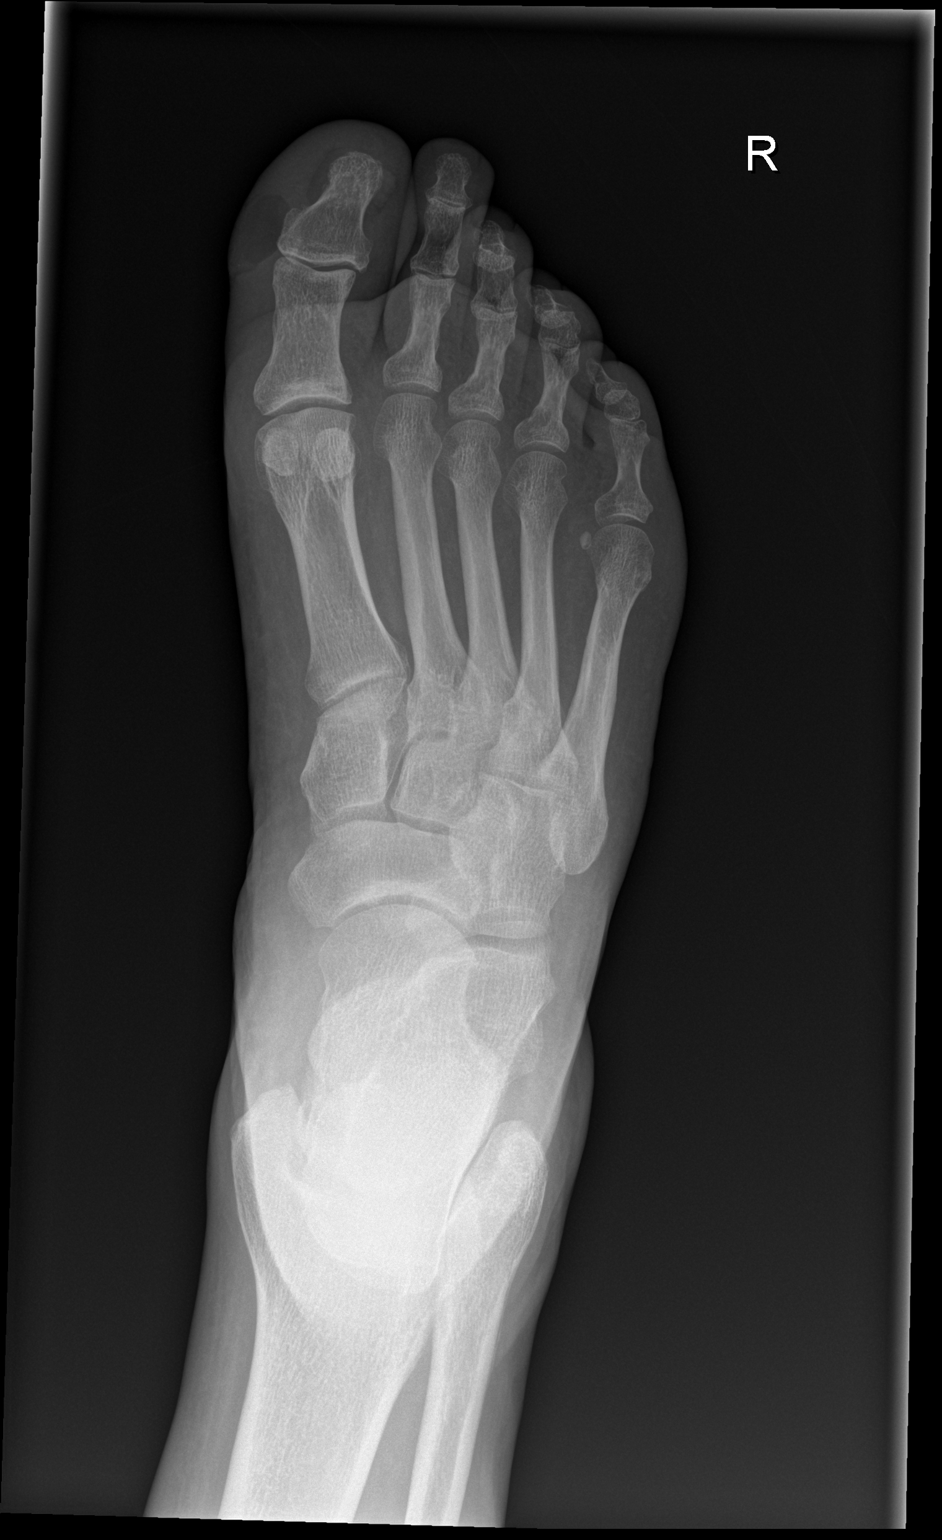

[x foot obl right]
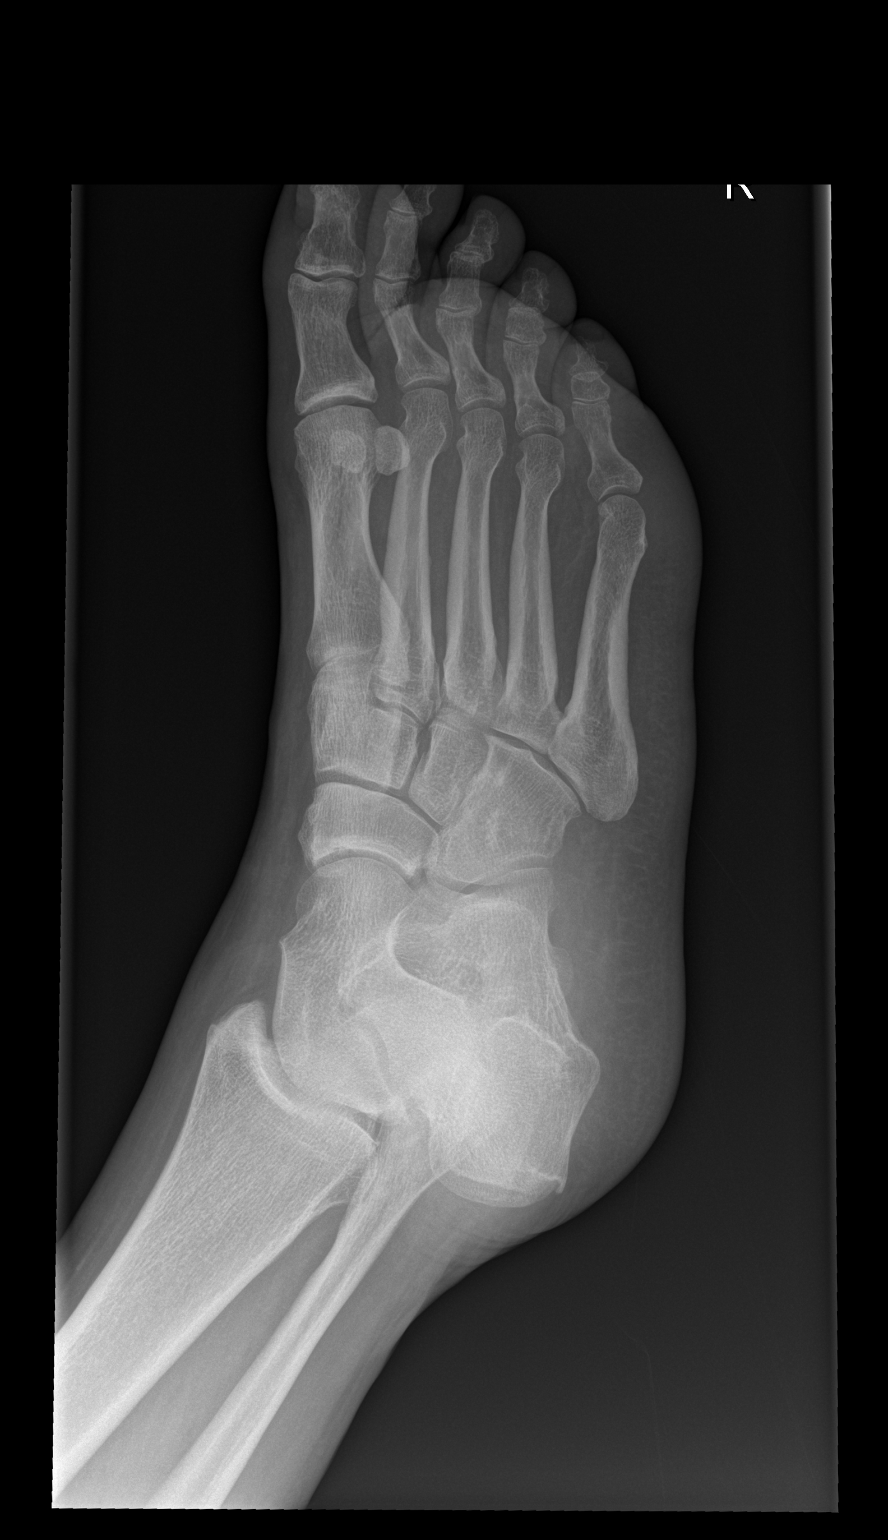

[x foot lat right]
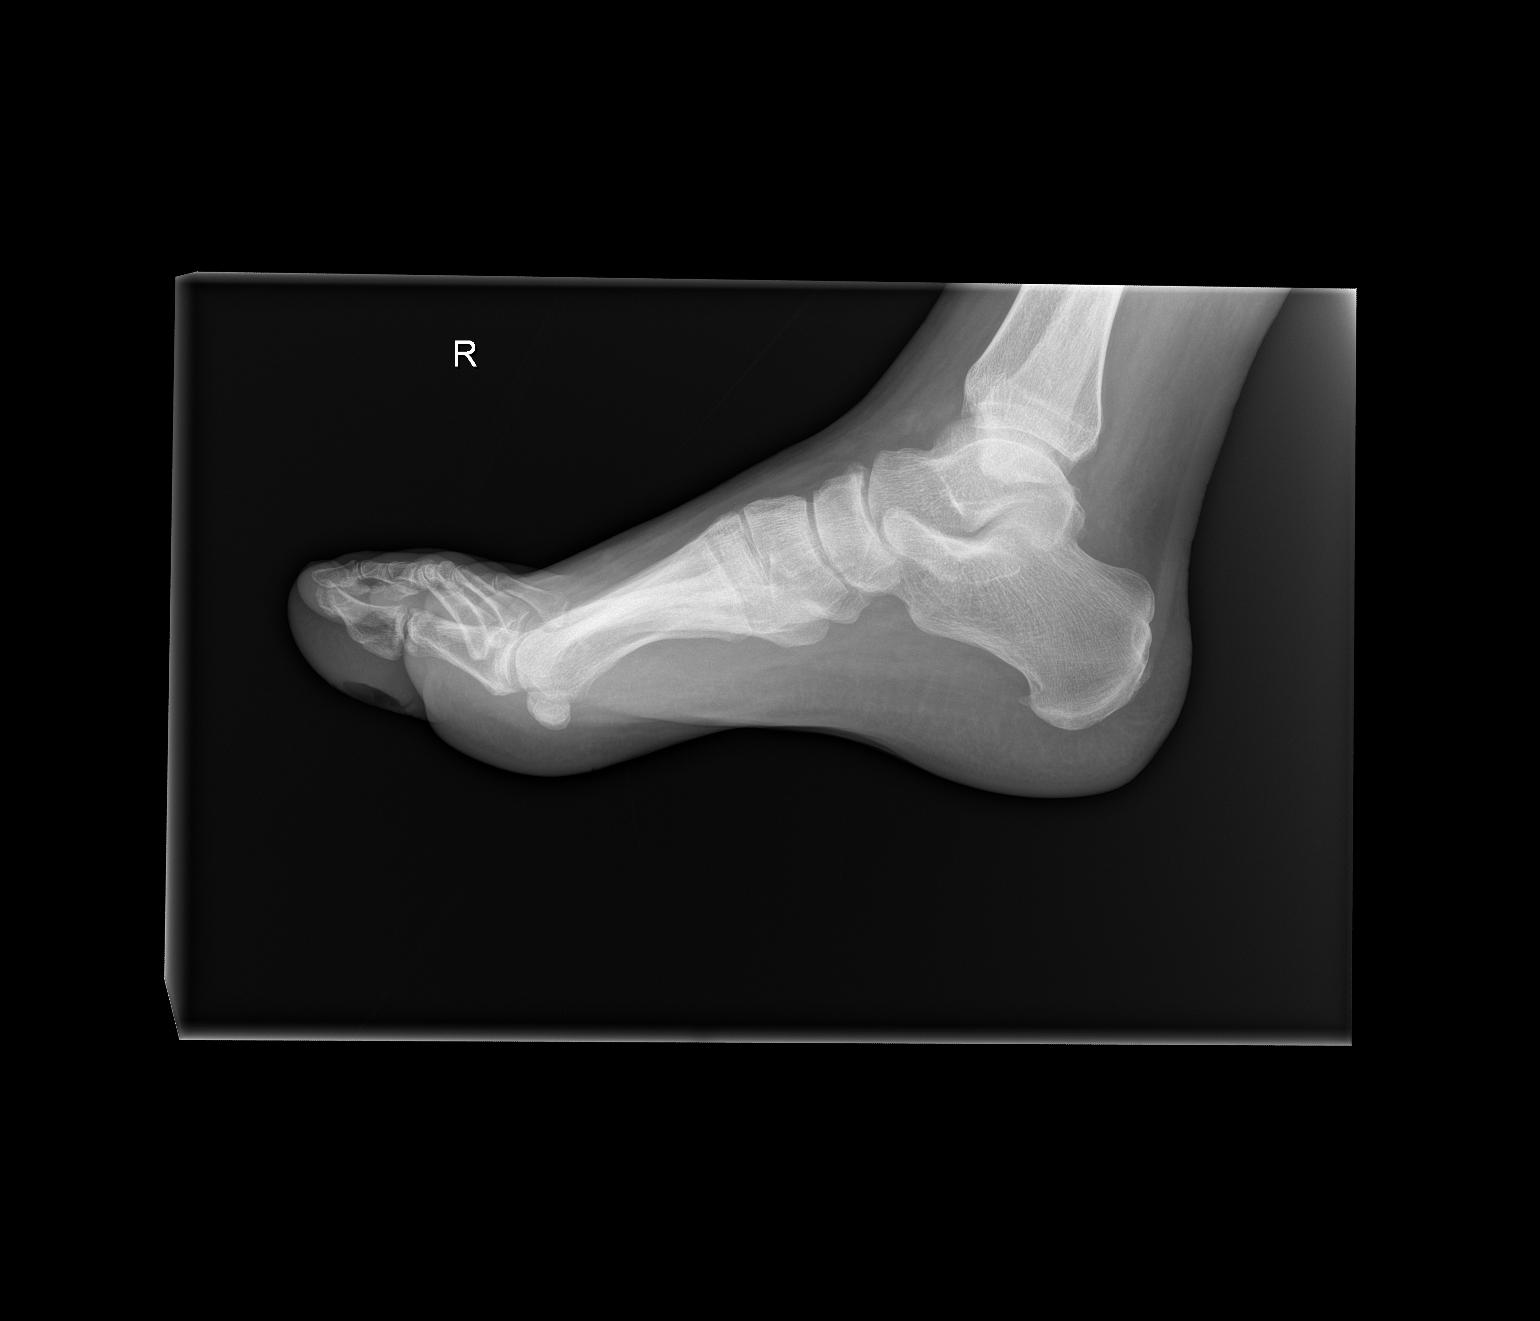

[3 of 3 positions shown; findings below may reference images not displayed]

FINDINGS: Soft tissue ulceration underlying the medial aspect of the right
great toe along the plantar surface. No evidence of periostitis or
cortical destruction to suggest acute osteomyelitis. No fracture or
malalignment. Tiny plantar calcaneal spur. No radiopaque foreign
body.
IMPRESSION: 1. No radiographic evidence of acute osteomyelitis.
2. Soft tissue ulceration at the plantar-medial aspect of the right
great toe.

## 2020-10-26 IMAGING — DX DG TOE GREAT 2+V*L*
3 series · 3 of 3 positions shown · non-contrast
Comparison: None.

CLINICAL DATA: Pt has diabetic ulcer on right great toe. C/o having
no feeling in any of his toes on either foot for quite some time.
Family hx of diabetes.

EXAM:
LEFT GREAT TOE

[toe ap]
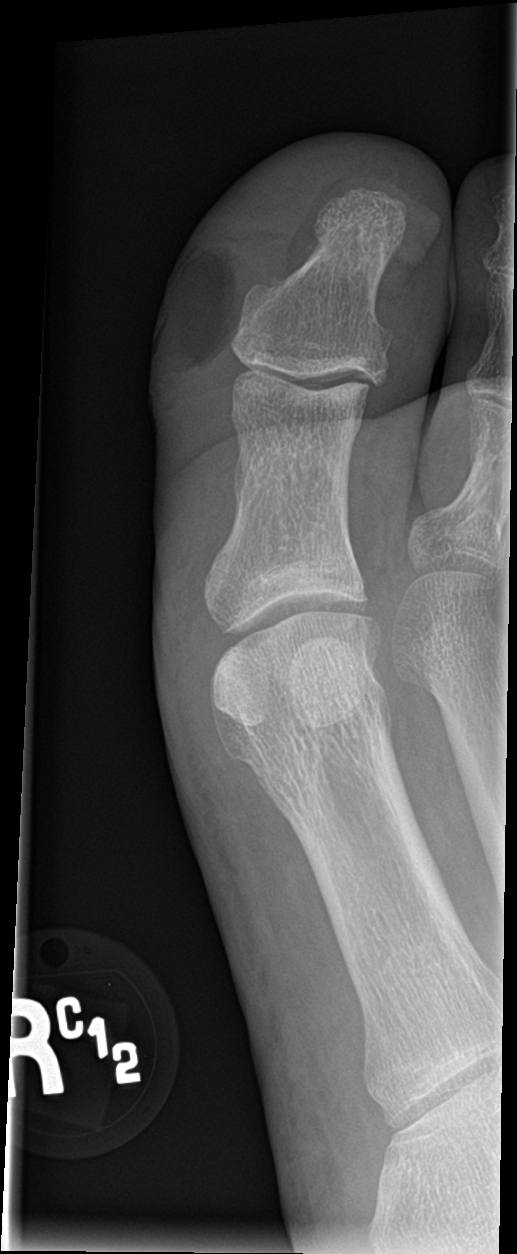

[toe obl]
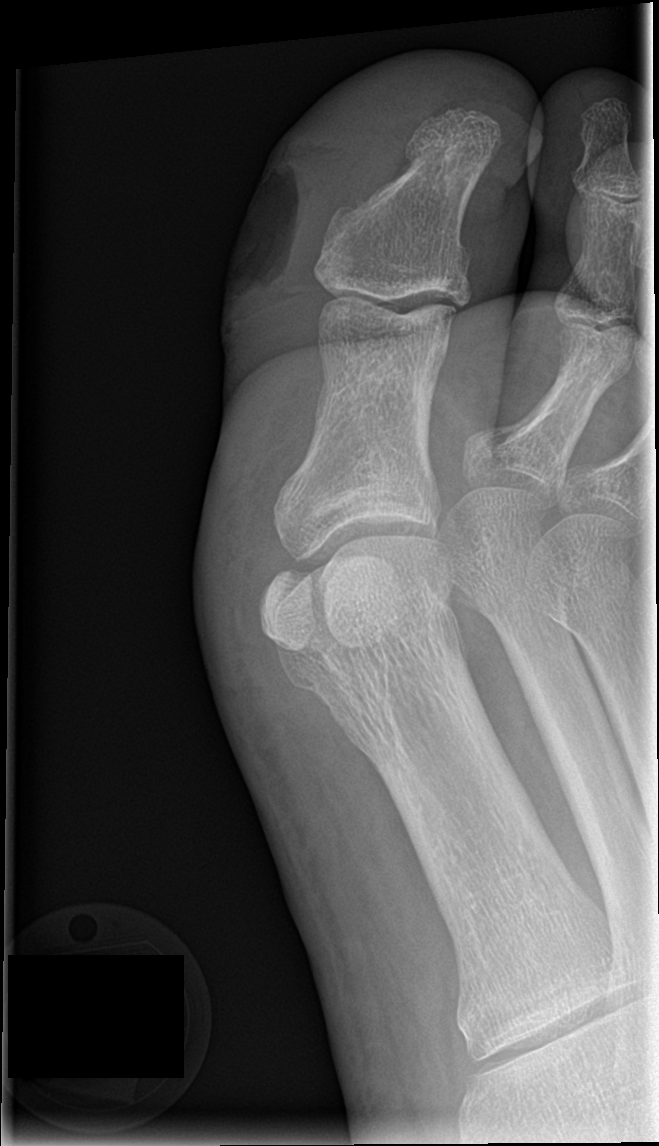

[toe lat]
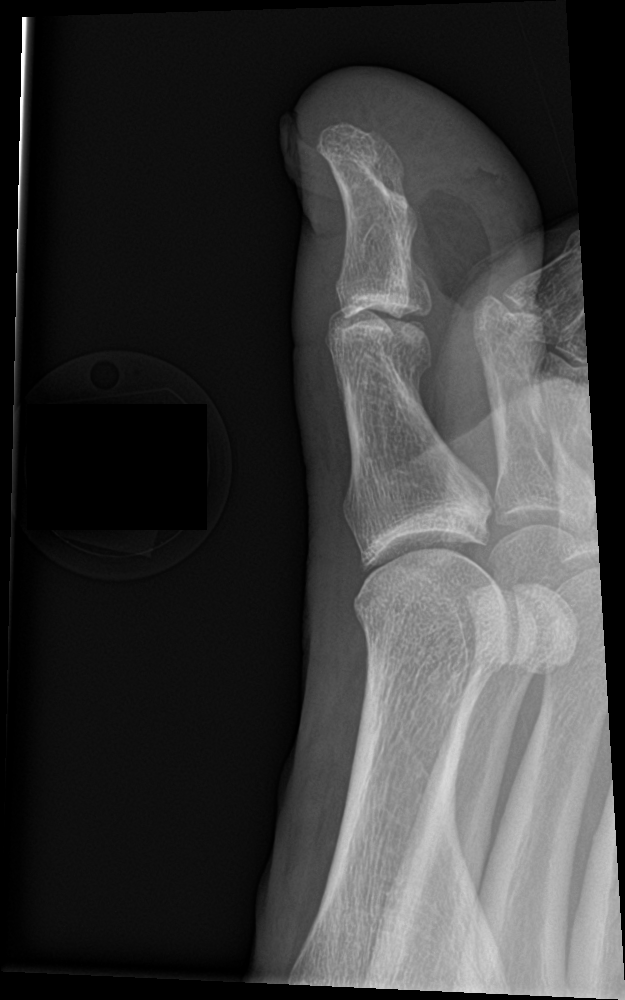

[3 of 3 positions shown; findings below may reference images not displayed]

FINDINGS: Ulcerated lesion on the inferomedial aspect of the right great toe
measuring approximately 1.8 cm. On the frontal view the deep aspect
of the ulcer appears to abut the cortex of the distal phalanx. No
definite bony destruction identified. No other acute finding in the
visualized skeleton.
IMPRESSION: Ulcerated lesion on the inferomedial aspect of the right great toe.
On the frontal view the deep aspect of the ulcer appears to abut the
cortex of the distal phalanx. No definite bony destruction.
